# Patient Record
Sex: Female | Born: 1935 | Race: White | Hispanic: No | Smoking: Never smoker
Health system: Southern US, Community
[De-identification: ages and names within clinical notes are randomized; demographics above are authoritative.]

## PROBLEM LIST (undated history)

## (undated) DIAGNOSIS — K449 Diaphragmatic hernia without obstruction or gangrene: Secondary | ICD-10-CM

## (undated) DIAGNOSIS — F419 Anxiety disorder, unspecified: Secondary | ICD-10-CM

## (undated) DIAGNOSIS — M419 Scoliosis, unspecified: Secondary | ICD-10-CM

## (undated) DIAGNOSIS — Q782 Osteopetrosis: Secondary | ICD-10-CM

## (undated) DIAGNOSIS — IMO0002 Reserved for concepts with insufficient information to code with codable children: Secondary | ICD-10-CM

## (undated) DIAGNOSIS — K219 Gastro-esophageal reflux disease without esophagitis: Secondary | ICD-10-CM

## (undated) HISTORY — PX: BREAST LUMPECTOMY: SHX2

---

## 1998-02-06 ENCOUNTER — Emergency Department (HOSPITAL_COMMUNITY): Admission: EM | Admit: 1998-02-06 | Discharge: 1998-02-06 | Payer: Self-pay | Admitting: Emergency Medicine

## 1999-05-21 ENCOUNTER — Emergency Department (HOSPITAL_COMMUNITY): Admission: EM | Admit: 1999-05-21 | Discharge: 1999-05-21 | Payer: Self-pay | Admitting: *Deleted

## 1999-10-04 ENCOUNTER — Encounter: Payer: Self-pay | Admitting: Family Medicine

## 1999-10-04 ENCOUNTER — Encounter: Admission: RE | Admit: 1999-10-04 | Discharge: 1999-10-04 | Payer: Self-pay | Admitting: Family Medicine

## 2000-10-31 ENCOUNTER — Encounter: Admission: RE | Admit: 2000-10-31 | Discharge: 2000-10-31 | Payer: Self-pay | Admitting: Family Medicine

## 2000-10-31 ENCOUNTER — Encounter: Payer: Self-pay | Admitting: Family Medicine

## 2000-11-19 ENCOUNTER — Encounter: Admission: RE | Admit: 2000-11-19 | Discharge: 2000-11-19 | Payer: Self-pay | Admitting: Family Medicine

## 2000-11-19 ENCOUNTER — Encounter: Payer: Self-pay | Admitting: Family Medicine

## 2001-05-07 ENCOUNTER — Other Ambulatory Visit: Admission: RE | Admit: 2001-05-07 | Discharge: 2001-05-07 | Payer: Self-pay | Admitting: Family Medicine

## 2002-07-15 ENCOUNTER — Encounter (INDEPENDENT_AMBULATORY_CARE_PROVIDER_SITE_OTHER): Payer: Self-pay | Admitting: Specialist

## 2002-07-15 ENCOUNTER — Other Ambulatory Visit: Admission: RE | Admit: 2002-07-15 | Discharge: 2002-07-15 | Payer: Self-pay | Admitting: Family Medicine

## 2002-07-15 ENCOUNTER — Encounter: Payer: Self-pay | Admitting: Family Medicine

## 2002-07-15 ENCOUNTER — Encounter: Admission: RE | Admit: 2002-07-15 | Discharge: 2002-07-15 | Payer: Self-pay | Admitting: Family Medicine

## 2002-08-04 HISTORY — PX: BREAST EXCISIONAL BIOPSY: SUR124

## 2002-08-11 ENCOUNTER — Encounter (INDEPENDENT_AMBULATORY_CARE_PROVIDER_SITE_OTHER): Payer: Self-pay | Admitting: *Deleted

## 2002-08-11 ENCOUNTER — Ambulatory Visit (HOSPITAL_BASED_OUTPATIENT_CLINIC_OR_DEPARTMENT_OTHER): Admission: RE | Admit: 2002-08-11 | Discharge: 2002-08-11 | Payer: Self-pay | Admitting: Surgery

## 2003-11-26 ENCOUNTER — Encounter: Admission: RE | Admit: 2003-11-26 | Discharge: 2003-11-26 | Payer: Self-pay | Admitting: Family Medicine

## 2003-12-15 ENCOUNTER — Other Ambulatory Visit: Admission: RE | Admit: 2003-12-15 | Discharge: 2003-12-15 | Payer: Self-pay | Admitting: Family Medicine

## 2004-03-18 ENCOUNTER — Encounter: Admission: RE | Admit: 2004-03-18 | Discharge: 2004-03-18 | Payer: Self-pay | Admitting: Family Medicine

## 2004-06-17 ENCOUNTER — Encounter: Admission: RE | Admit: 2004-06-17 | Discharge: 2004-06-17 | Payer: Self-pay | Admitting: Family Medicine

## 2004-12-28 ENCOUNTER — Encounter: Admission: RE | Admit: 2004-12-28 | Discharge: 2004-12-28 | Payer: Self-pay | Admitting: Family Medicine

## 2005-01-27 ENCOUNTER — Encounter: Admission: RE | Admit: 2005-01-27 | Discharge: 2005-01-27 | Payer: Self-pay | Admitting: Family Medicine

## 2005-08-08 ENCOUNTER — Encounter: Admission: RE | Admit: 2005-08-08 | Discharge: 2005-08-08 | Payer: Self-pay | Admitting: Family Medicine

## 2005-08-14 ENCOUNTER — Encounter: Admission: RE | Admit: 2005-08-14 | Discharge: 2005-08-14 | Payer: Self-pay | Admitting: Family Medicine

## 2005-08-28 ENCOUNTER — Encounter: Admission: RE | Admit: 2005-08-28 | Discharge: 2005-08-28 | Payer: Self-pay | Admitting: Family Medicine

## 2006-08-31 ENCOUNTER — Other Ambulatory Visit: Admission: RE | Admit: 2006-08-31 | Discharge: 2006-08-31 | Payer: Self-pay | Admitting: Family Medicine

## 2007-07-09 ENCOUNTER — Encounter: Admission: RE | Admit: 2007-07-09 | Discharge: 2007-07-09 | Payer: Self-pay | Admitting: Family Medicine

## 2007-09-03 ENCOUNTER — Encounter: Admission: RE | Admit: 2007-09-03 | Discharge: 2007-09-03 | Payer: Self-pay | Admitting: Family Medicine

## 2007-09-18 ENCOUNTER — Ambulatory Visit: Payer: Self-pay | Admitting: Gastroenterology

## 2007-09-20 ENCOUNTER — Encounter: Admission: RE | Admit: 2007-09-20 | Discharge: 2007-09-20 | Payer: Self-pay | Admitting: Family Medicine

## 2007-10-01 ENCOUNTER — Telehealth (INDEPENDENT_AMBULATORY_CARE_PROVIDER_SITE_OTHER): Payer: Self-pay

## 2007-10-03 ENCOUNTER — Telehealth: Payer: Self-pay | Admitting: Gastroenterology

## 2007-10-04 ENCOUNTER — Ambulatory Visit: Payer: Self-pay | Admitting: Gastroenterology

## 2007-10-04 HISTORY — PX: COLONOSCOPY: SHX5424

## 2007-10-06 ENCOUNTER — Emergency Department (HOSPITAL_COMMUNITY): Admission: EM | Admit: 2007-10-06 | Discharge: 2007-10-06 | Payer: Self-pay | Admitting: Family Medicine

## 2008-06-05 DIAGNOSIS — K449 Diaphragmatic hernia without obstruction or gangrene: Secondary | ICD-10-CM

## 2008-06-05 HISTORY — DX: Diaphragmatic hernia without obstruction or gangrene: K44.9

## 2008-07-27 ENCOUNTER — Encounter: Admission: RE | Admit: 2008-07-27 | Discharge: 2008-07-27 | Payer: Self-pay | Admitting: Family Medicine

## 2008-09-25 ENCOUNTER — Other Ambulatory Visit: Admission: RE | Admit: 2008-09-25 | Discharge: 2008-09-25 | Payer: Self-pay | Admitting: Geriatric Medicine

## 2008-10-16 ENCOUNTER — Encounter: Admission: RE | Admit: 2008-10-16 | Discharge: 2008-10-16 | Payer: Self-pay | Admitting: Geriatric Medicine

## 2009-08-17 ENCOUNTER — Encounter: Admission: RE | Admit: 2009-08-17 | Discharge: 2009-08-17 | Payer: Self-pay | Admitting: Geriatric Medicine

## 2010-01-26 ENCOUNTER — Encounter: Admission: RE | Admit: 2010-01-26 | Discharge: 2010-01-26 | Payer: Self-pay | Admitting: Geriatric Medicine

## 2010-04-06 ENCOUNTER — Ambulatory Visit (HOSPITAL_COMMUNITY): Admission: RE | Admit: 2010-04-06 | Discharge: 2010-04-06 | Payer: Self-pay | Admitting: Urology

## 2010-06-26 ENCOUNTER — Encounter: Payer: Self-pay | Admitting: Geriatric Medicine

## 2010-10-13 ENCOUNTER — Other Ambulatory Visit: Payer: Self-pay | Admitting: Geriatric Medicine

## 2010-10-13 DIAGNOSIS — Z1231 Encounter for screening mammogram for malignant neoplasm of breast: Secondary | ICD-10-CM

## 2010-10-18 ENCOUNTER — Ambulatory Visit: Payer: Self-pay

## 2010-10-19 ENCOUNTER — Ambulatory Visit
Admission: RE | Admit: 2010-10-19 | Discharge: 2010-10-19 | Disposition: A | Discharge status: Home / Self Care | Payer: Medicare Other | Source: Ambulatory Visit | Attending: Geriatric Medicine | Admitting: Geriatric Medicine

## 2010-10-19 DIAGNOSIS — Z1231 Encounter for screening mammogram for malignant neoplasm of breast: Secondary | ICD-10-CM

## 2010-10-20 ENCOUNTER — Ambulatory Visit: Payer: Self-pay

## 2010-10-21 NOTE — Op Note (Signed)
NAME:  Jennifer Kirk, Jennifer Kirk                      ACCOUNT NO.:  [id number]   MEDICAL RECORD NO.:  [id number]                   PATIENT TYPE:  AMB   LOCATION:  DSC                                  FACILITY:  [facility]   PHYSICIAN:  Currie Paris, M.D.           DATE OF BIRTH:  07-04-1935   DATE OF PROCEDURE:  08/11/2002  DATE OF DISCHARGE:                                 OPERATIVE REPORT   CCS [id number].   PREOPERATIVE DIAGNOSIS:  Mass, left breast.   POSTOPERATIVE DIAGNOSIS:  Mass, left breast.   PROCEDURE:  Excision, left breast mass.   SURGEON:  Currie Paris, M.D.   ANESTHESIA:  MAC.   CLINICAL HISTORY:  This patient recently found a small left breast mass and  biopsy showed some atypia, but a diagnosis of cancer could not be made;  however, the mass was worrisome on ultrasound so we elected to do an  excisional biopsy.   DESCRIPTION OF PROCEDURE:  The patient was seen in the holding area and had  no further questions.  In the operating room the area in question was  identified by the patient by palpation and marked by me.  We also confirmed  its location with ultrasound in about the 5 o'clock position about 6 cm from  the nipple.   After satisfactory IV sedation was given, the breast was then prepped and  draped and anesthetized with 1% Xylocaine.  I made a radial incision  directly over the mass and as soon as I got into the subcutaneous tissues, I  used cautery to go around the area in question, first inferiorly since we  were almost at the inframammary fold, and then medially and a little bit  laterally, and then once I had that freed up, I had the lower edge freed up,  I was able to come under it, elevate it into the wound and come under it  with the cautery, staying right on the chest wall.  This gave me the ability  to palpate it between my fingers and pull and retract it out of the wound  and then using cautery, completely excised it.  There was at least  one  complex-looking cyst that was included, and I think some fibrocystic change  was noted.  I got the entire area of palpable abnormality out, which  measured about a centimeter in size with about what I thought was perhaps as  much as a centimeter margin in directions.   I used the marking clips to orient this for the pathologist.  This was sent  for permanents.   The wound was checked and when everything was dry, I closed this with 3-0  Vicryl to reapproximate some of the breast tissue and 4-0 Monocryl  subcuticular on the skin plus Steri-Strips.   The patient tolerated the procedure well.  There were no operative  complications.  All counts were correct.  Currie Paris, M.D.    CJS/MEDQ  D:  08/11/2002  T:  08/11/2002  Job:  [id number]   cc:   Quita Skye. Artis Flock, M.D.  [address]  [place]  [place] [address]  Fax: [phone]   Rosalie Gums, MD, [facility]

## 2011-07-04 DIAGNOSIS — R42 Dizziness and giddiness: Secondary | ICD-10-CM | POA: Diagnosis not present

## 2011-07-04 DIAGNOSIS — Z23 Encounter for immunization: Secondary | ICD-10-CM | POA: Diagnosis not present

## 2011-07-04 DIAGNOSIS — M810 Age-related osteoporosis without current pathological fracture: Secondary | ICD-10-CM | POA: Diagnosis not present

## 2011-07-26 DIAGNOSIS — Z79899 Other long term (current) drug therapy: Secondary | ICD-10-CM | POA: Diagnosis not present

## 2011-07-31 ENCOUNTER — Other Ambulatory Visit (HOSPITAL_COMMUNITY): Payer: Self-pay | Admitting: *Deleted

## 2011-08-03 ENCOUNTER — Encounter (HOSPITAL_COMMUNITY)
Admission: RE | Admit: 2011-08-03 | Discharge: 2011-08-03 | Disposition: A | Discharge status: Home / Self Care | Payer: Medicare Other | Source: Ambulatory Visit | Attending: Geriatric Medicine | Admitting: Geriatric Medicine

## 2011-08-03 DIAGNOSIS — M810 Age-related osteoporosis without current pathological fracture: Secondary | ICD-10-CM | POA: Insufficient documentation

## 2011-08-03 MED ORDER — ZOLEDRONIC ACID 5 MG/100ML IV SOLN
5.0000 mg | Freq: Once | INTRAVENOUS | Status: AC
  Administered 2011-08-03: 5 mg via INTRAVENOUS
  Filled 2011-08-03: qty 100

## 2011-11-06 ENCOUNTER — Other Ambulatory Visit: Payer: Self-pay | Admitting: Geriatric Medicine

## 2011-11-06 DIAGNOSIS — Z1231 Encounter for screening mammogram for malignant neoplasm of breast: Secondary | ICD-10-CM

## 2011-11-27 ENCOUNTER — Ambulatory Visit
Admission: RE | Admit: 2011-11-27 | Discharge: 2011-11-27 | Disposition: A | Discharge status: Home / Self Care | Payer: Medicare Other | Source: Ambulatory Visit | Attending: Geriatric Medicine | Admitting: Geriatric Medicine

## 2011-11-27 DIAGNOSIS — Z1231 Encounter for screening mammogram for malignant neoplasm of breast: Secondary | ICD-10-CM

## 2011-11-30 ENCOUNTER — Other Ambulatory Visit: Payer: Self-pay | Admitting: Geriatric Medicine

## 2011-11-30 DIAGNOSIS — R928 Other abnormal and inconclusive findings on diagnostic imaging of breast: Secondary | ICD-10-CM

## 2011-12-06 ENCOUNTER — Other Ambulatory Visit: Payer: Medicare Other

## 2011-12-12 ENCOUNTER — Ambulatory Visit
Admission: RE | Admit: 2011-12-12 | Discharge: 2011-12-12 | Disposition: A | Discharge status: Home / Self Care | Payer: Medicare Other | Source: Ambulatory Visit | Attending: Geriatric Medicine | Admitting: Geriatric Medicine

## 2011-12-12 DIAGNOSIS — R928 Other abnormal and inconclusive findings on diagnostic imaging of breast: Secondary | ICD-10-CM

## 2012-01-04 DIAGNOSIS — W57XXXA Bitten or stung by nonvenomous insect and other nonvenomous arthropods, initial encounter: Secondary | ICD-10-CM | POA: Diagnosis not present

## 2012-01-04 DIAGNOSIS — T148 Other injury of unspecified body region: Secondary | ICD-10-CM | POA: Diagnosis not present

## 2012-02-14 ENCOUNTER — Other Ambulatory Visit: Payer: Self-pay | Admitting: Geriatric Medicine

## 2012-02-14 DIAGNOSIS — Z23 Encounter for immunization: Secondary | ICD-10-CM | POA: Diagnosis not present

## 2012-02-14 DIAGNOSIS — R42 Dizziness and giddiness: Secondary | ICD-10-CM | POA: Diagnosis not present

## 2012-02-14 DIAGNOSIS — Z0000 Encounter for general adult medical examination without abnormal findings: Secondary | ICD-10-CM | POA: Diagnosis not present

## 2012-02-14 DIAGNOSIS — M549 Dorsalgia, unspecified: Secondary | ICD-10-CM | POA: Diagnosis not present

## 2012-02-14 DIAGNOSIS — K449 Diaphragmatic hernia without obstruction or gangrene: Secondary | ICD-10-CM | POA: Diagnosis not present

## 2012-02-14 DIAGNOSIS — Z1331 Encounter for screening for depression: Secondary | ICD-10-CM | POA: Diagnosis not present

## 2012-02-14 DIAGNOSIS — E7800 Pure hypercholesterolemia, unspecified: Secondary | ICD-10-CM | POA: Diagnosis not present

## 2012-02-14 DIAGNOSIS — R5381 Other malaise: Secondary | ICD-10-CM | POA: Diagnosis not present

## 2012-02-14 DIAGNOSIS — M545 Low back pain: Secondary | ICD-10-CM

## 2012-02-14 DIAGNOSIS — Z79899 Other long term (current) drug therapy: Secondary | ICD-10-CM | POA: Diagnosis not present

## 2012-02-14 DIAGNOSIS — R5383 Other fatigue: Secondary | ICD-10-CM | POA: Diagnosis not present

## 2012-02-16 DIAGNOSIS — H6120 Impacted cerumen, unspecified ear: Secondary | ICD-10-CM | POA: Diagnosis not present

## 2012-02-20 ENCOUNTER — Ambulatory Visit
Admission: RE | Admit: 2012-02-20 | Discharge: 2012-02-20 | Disposition: A | Discharge status: Home / Self Care | Payer: Medicare Other | Source: Ambulatory Visit | Attending: Geriatric Medicine | Admitting: Geriatric Medicine

## 2012-02-20 DIAGNOSIS — M48061 Spinal stenosis, lumbar region without neurogenic claudication: Secondary | ICD-10-CM | POA: Diagnosis not present

## 2012-02-20 DIAGNOSIS — M545 Low back pain, unspecified: Secondary | ICD-10-CM

## 2012-03-11 DIAGNOSIS — M810 Age-related osteoporosis without current pathological fracture: Secondary | ICD-10-CM | POA: Diagnosis not present

## 2012-03-26 DIAGNOSIS — M47817 Spondylosis without myelopathy or radiculopathy, lumbosacral region: Secondary | ICD-10-CM | POA: Diagnosis not present

## 2012-04-02 DIAGNOSIS — H6980 Other specified disorders of Eustachian tube, unspecified ear: Secondary | ICD-10-CM | POA: Diagnosis not present

## 2012-04-02 DIAGNOSIS — H6990 Unspecified Eustachian tube disorder, unspecified ear: Secondary | ICD-10-CM | POA: Diagnosis not present

## 2012-04-02 DIAGNOSIS — H9110 Presbycusis, unspecified ear: Secondary | ICD-10-CM | POA: Diagnosis not present

## 2012-04-04 DIAGNOSIS — H52209 Unspecified astigmatism, unspecified eye: Secondary | ICD-10-CM | POA: Diagnosis not present

## 2012-04-04 DIAGNOSIS — H2510 Age-related nuclear cataract, unspecified eye: Secondary | ICD-10-CM | POA: Diagnosis not present

## 2012-04-15 DIAGNOSIS — M810 Age-related osteoporosis without current pathological fracture: Secondary | ICD-10-CM | POA: Diagnosis not present

## 2012-04-15 DIAGNOSIS — Z23 Encounter for immunization: Secondary | ICD-10-CM | POA: Diagnosis not present

## 2012-04-18 ENCOUNTER — Encounter (HOSPITAL_COMMUNITY): Payer: Self-pay | Admitting: *Deleted

## 2012-04-18 ENCOUNTER — Emergency Department (HOSPITAL_COMMUNITY)
Admission: EM | Admit: 2012-04-18 | Discharge: 2012-04-19 | Disposition: A | Discharge status: Home / Self Care | Payer: Medicare Other | Attending: Emergency Medicine | Admitting: Emergency Medicine

## 2012-04-18 DIAGNOSIS — IMO0002 Reserved for concepts with insufficient information to code with codable children: Secondary | ICD-10-CM | POA: Diagnosis not present

## 2012-04-18 DIAGNOSIS — Z79899 Other long term (current) drug therapy: Secondary | ICD-10-CM | POA: Insufficient documentation

## 2012-04-18 DIAGNOSIS — Z8739 Personal history of other diseases of the musculoskeletal system and connective tissue: Secondary | ICD-10-CM | POA: Insufficient documentation

## 2012-04-18 DIAGNOSIS — T5991XA Toxic effect of unspecified gases, fumes and vapors, accidental (unintentional), initial encounter: Secondary | ICD-10-CM | POA: Insufficient documentation

## 2012-04-18 DIAGNOSIS — Y929 Unspecified place or not applicable: Secondary | ICD-10-CM | POA: Insufficient documentation

## 2012-04-18 DIAGNOSIS — Q782 Osteopetrosis: Secondary | ICD-10-CM | POA: Insufficient documentation

## 2012-04-18 DIAGNOSIS — Z8659 Personal history of other mental and behavioral disorders: Secondary | ICD-10-CM | POA: Insufficient documentation

## 2012-04-18 DIAGNOSIS — Z8719 Personal history of other diseases of the digestive system: Secondary | ICD-10-CM | POA: Insufficient documentation

## 2012-04-18 DIAGNOSIS — Y939 Activity, unspecified: Secondary | ICD-10-CM | POA: Insufficient documentation

## 2012-04-18 HISTORY — DX: Diaphragmatic hernia without obstruction or gangrene: K44.9

## 2012-04-18 HISTORY — DX: Reserved for concepts with insufficient information to code with codable children: IMO0002

## 2012-04-18 HISTORY — DX: Scoliosis, unspecified: M41.9

## 2012-04-18 HISTORY — DX: Osteopetrosis: Q78.2

## 2012-04-18 HISTORY — DX: Gastro-esophageal reflux disease without esophagitis: K21.9

## 2012-04-18 HISTORY — DX: Anxiety disorder, unspecified: F41.9

## 2012-04-18 NOTE — ED Provider Notes (Signed)
History     CSN: [id number]  Arrival date & time 04/18/12  2229   First MD Initiated Contact with Patient 04/18/12 2337      Chief Complaint  Patient presents with  . Toxic Inhalation     HPI Pt was seen at 2340.  Per pt, c/o sudden onset and resolution of one episode of chemical inhalation that occurred PTA.  Pt states she was spray painting at home with the windows closed and no mask on, when she felt upper chest "tightness," SOB, nausea, lightheadedness. States she immediately opened the windows and went outside and felt better.  All symptoms remain resolved.  Denies current CP/palpitations, no SOB/cough, no abd pain, no N/V/D, no back pain.     Past Medical History  Diagnosis Date  . Anxiety   . Hiatal hernia   . GERD (gastroesophageal reflux disease)   . Scoliosis   . Degenerative disc disease   . Osteopetrosis     Past Surgical History  Procedure Date  . Breast lumpectomy     History  Substance Use Topics  . Smoking status: Never Smoker   . Smokeless tobacco: Not on file  . Alcohol Use: Yes     Review of Systems ROS: Statement: All systems negative except as marked or noted in the HPI; Constitutional: Negative for fever and chills. ; ; Eyes: Negative for eye pain, redness and discharge. ; ; ENMT: Negative for ear pain, hoarseness, nasal congestion, sinus pressure and sore throat. ; ; Cardiovascular: Negative for chest pain, palpitations, diaphoresis, dyspnea and peripheral edema. ; ; Respiratory: Negative for cough, wheezing and stridor. ; ; Gastrointestinal: Negative for nausea, vomiting, diarrhea, abdominal pain, blood in stool, hematemesis, jaundice and rectal bleeding. . ; ; Genitourinary: Negative for dysuria, flank pain and hematuria. ; ; Musculoskeletal: Negative for back pain and neck pain. Negative for swelling and trauma.; ; Skin: Negative for pruritus, rash, abrasions, blisters, bruising and skin lesion.; ; Neuro: Negative for headache, lightheadedness and  neck stiffness. Negative for weakness, altered level of consciousness , altered mental status, extremity weakness, paresthesias, involuntary movement, seizure and syncope.       Allergies  Codeine and Penicillins  Home Medications   Current Outpatient Rx  Name  Route  Sig  Dispense  Refill  . RISAQUAD PO CAPS   Oral   Take 1 capsule by mouth daily.         Marland Kitchen BIOTIN PO   Oral   Take 1 capsule by mouth daily.         . CHOLECALCIFEROL 400 UNITS PO TABS   Oral   Take 400 Units by mouth daily.         . OMEGA-3 FATTY ACIDS 1000 MG PO CAPS   Oral   Take 1 g by mouth daily.         Marland Kitchen GINGER PO   Oral   Take 1 tablet by mouth daily.         . ADULT MULTIVITAMIN W/MINERALS CH   Oral   Take 1 tablet by mouth daily.         Marland Kitchen VITAMIN C 500 MG PO TABS   Oral   Take 500 mg by mouth daily.           BP 130/81  Pulse 97  Temp 97.6 F (36.4 C) (Oral)  Resp 16  Ht 4\' 8"  (1.422 m)  Wt 110 lb (49.896 kg)  BMI 24.66 kg/m2  SpO2 94%  Physical  Exam 2345: Physical examination:  Nursing notes reviewed; Vital signs and O2 SAT reviewed;  Constitutional: Well developed, Well nourished, Well hydrated, In no acute distress; Head:  Normocephalic, atraumatic; Eyes: EOMI, PERRL, No scleral icterus; ENMT: Mouth and pharynx normal, Mucous membranes moist. No intra-oral edema, no hoarse voice, no drooling, no stridor.; Neck: Supple, Full range of motion, No lymphadenopathy; Cardiovascular: Regular rate and rhythm, No gallop; Respiratory: Breath sounds clear & equal bilaterally, No rales, rhonchi, wheezes.  Speaking full sentences with ease, Normal respiratory effort/excursion; Chest: Nontender, Movement normal; Abdomen: Soft, Nontender, Nondistended, Normal bowel sounds;; Extremities: Pulses normal, No tenderness, No edema, No calf edema or asymmetry.; Neuro: AA&Ox3, Major CN grossly intact.  Speech clear. Climbs in and out of chair by herself without difficulty. Gait steady. No  gross focal motor or sensory deficits in extremities.; Skin: Color normal, Warm, Dry.    ED Course  Procedures   MDM  MDM Reviewed: previous chart, nursing note and vitals     2355:  Pt wants to go home now.  Pt does not want any testing (EKG, CXR) and "just wants to go home right now."  Has been ambulatory around the ED with steady gait, easy resps, Sats 94% R/A.  Offered CXR again and pt refuses.  Pt makes her own medical decisions.  Pt encouraged to f/u with PMD tomorrow or return to the ED if she changes her mind.  Verb understanding.        Laray Anger, DO 04/21/12 1624

## 2012-04-18 NOTE — ED Notes (Signed)
Pt alert and oriented from home with c/o spray paint inhalation. Patient sts she had chest tightness, respiratory difficulty, nausea and lightheadedness when she first inhaled the spray paint but now she is feeling much better. Patient with no distress at this time, no difficulty breathing.

## 2012-04-18 NOTE — ED Notes (Signed)
Pt was spray painting fireplace in a no-ventilated area and experienced a few minutes of chest tightness, shortness of breath, dizziness and nausea. Symptoms all cleared after going outside in the fresh air; pt denies chest pain, shortness of breath, dizziness or nausea currently.

## 2012-04-19 NOTE — ED Notes (Signed)
Pt not in room upon coming to DC pt; son reported to secretary that he was taking her home because she was tired and was ready to go; pt did not wait for dc instructions

## 2012-04-23 DIAGNOSIS — M545 Low back pain, unspecified: Secondary | ICD-10-CM | POA: Diagnosis not present

## 2012-05-06 DIAGNOSIS — M545 Low back pain, unspecified: Secondary | ICD-10-CM | POA: Diagnosis not present

## 2012-05-09 DIAGNOSIS — M545 Low back pain, unspecified: Secondary | ICD-10-CM | POA: Diagnosis not present

## 2012-05-15 DIAGNOSIS — M545 Low back pain, unspecified: Secondary | ICD-10-CM | POA: Diagnosis not present

## 2012-05-23 DIAGNOSIS — M545 Low back pain, unspecified: Secondary | ICD-10-CM | POA: Diagnosis not present

## 2012-06-06 DIAGNOSIS — H6990 Unspecified Eustachian tube disorder, unspecified ear: Secondary | ICD-10-CM | POA: Diagnosis not present

## 2012-06-06 DIAGNOSIS — H6980 Other specified disorders of Eustachian tube, unspecified ear: Secondary | ICD-10-CM | POA: Diagnosis not present

## 2012-06-06 DIAGNOSIS — M545 Low back pain, unspecified: Secondary | ICD-10-CM | POA: Diagnosis not present

## 2012-06-06 DIAGNOSIS — H9110 Presbycusis, unspecified ear: Secondary | ICD-10-CM | POA: Diagnosis not present

## 2012-06-10 DIAGNOSIS — M545 Low back pain, unspecified: Secondary | ICD-10-CM | POA: Diagnosis not present

## 2012-06-12 DIAGNOSIS — M545 Low back pain, unspecified: Secondary | ICD-10-CM | POA: Diagnosis not present

## 2012-06-13 DIAGNOSIS — M545 Low back pain, unspecified: Secondary | ICD-10-CM | POA: Diagnosis not present

## 2012-06-17 DIAGNOSIS — M545 Low back pain, unspecified: Secondary | ICD-10-CM | POA: Diagnosis not present

## 2012-06-20 DIAGNOSIS — M545 Low back pain, unspecified: Secondary | ICD-10-CM | POA: Diagnosis not present

## 2012-06-24 DIAGNOSIS — M545 Low back pain, unspecified: Secondary | ICD-10-CM | POA: Diagnosis not present

## 2012-06-25 DIAGNOSIS — H6990 Unspecified Eustachian tube disorder, unspecified ear: Secondary | ICD-10-CM | POA: Diagnosis not present

## 2012-06-25 DIAGNOSIS — H6980 Other specified disorders of Eustachian tube, unspecified ear: Secondary | ICD-10-CM | POA: Diagnosis not present

## 2012-07-01 DIAGNOSIS — M545 Low back pain, unspecified: Secondary | ICD-10-CM | POA: Diagnosis not present

## 2012-07-08 DIAGNOSIS — M545 Low back pain, unspecified: Secondary | ICD-10-CM | POA: Diagnosis not present

## 2012-07-12 DIAGNOSIS — M545 Low back pain, unspecified: Secondary | ICD-10-CM | POA: Diagnosis not present

## 2012-07-15 DIAGNOSIS — M545 Low back pain, unspecified: Secondary | ICD-10-CM | POA: Diagnosis not present

## 2012-07-22 DIAGNOSIS — M545 Low back pain, unspecified: Secondary | ICD-10-CM | POA: Diagnosis not present

## 2012-07-23 DIAGNOSIS — H9110 Presbycusis, unspecified ear: Secondary | ICD-10-CM | POA: Diagnosis not present

## 2012-07-24 DIAGNOSIS — M545 Low back pain, unspecified: Secondary | ICD-10-CM | POA: Diagnosis not present

## 2012-07-30 DIAGNOSIS — M545 Low back pain, unspecified: Secondary | ICD-10-CM | POA: Diagnosis not present

## 2012-08-08 DIAGNOSIS — M545 Low back pain, unspecified: Secondary | ICD-10-CM | POA: Diagnosis not present

## 2012-08-12 DIAGNOSIS — M545 Low back pain, unspecified: Secondary | ICD-10-CM | POA: Diagnosis not present

## 2012-08-14 DIAGNOSIS — M545 Low back pain, unspecified: Secondary | ICD-10-CM | POA: Diagnosis not present

## 2012-08-21 DIAGNOSIS — M545 Low back pain, unspecified: Secondary | ICD-10-CM | POA: Diagnosis not present

## 2012-08-23 DIAGNOSIS — M545 Low back pain, unspecified: Secondary | ICD-10-CM | POA: Diagnosis not present

## 2012-08-28 DIAGNOSIS — M545 Low back pain, unspecified: Secondary | ICD-10-CM | POA: Diagnosis not present

## 2012-09-03 DIAGNOSIS — M545 Low back pain, unspecified: Secondary | ICD-10-CM | POA: Diagnosis not present

## 2012-10-08 DIAGNOSIS — H2510 Age-related nuclear cataract, unspecified eye: Secondary | ICD-10-CM | POA: Diagnosis not present

## 2012-10-08 DIAGNOSIS — H179 Unspecified corneal scar and opacity: Secondary | ICD-10-CM | POA: Diagnosis not present

## 2012-11-25 ENCOUNTER — Other Ambulatory Visit: Payer: Self-pay

## 2012-11-25 DIAGNOSIS — Z1231 Encounter for screening mammogram for malignant neoplasm of breast: Secondary | ICD-10-CM

## 2012-12-11 ENCOUNTER — Ambulatory Visit: Payer: Medicare Other

## 2013-01-01 ENCOUNTER — Ambulatory Visit
Admission: RE | Admit: 2013-01-01 | Discharge: 2013-01-01 | Disposition: A | Discharge status: Home / Self Care | Payer: Medicare Other | Source: Ambulatory Visit

## 2013-01-01 DIAGNOSIS — Z1231 Encounter for screening mammogram for malignant neoplasm of breast: Secondary | ICD-10-CM

## 2013-02-18 DIAGNOSIS — Z0000 Encounter for general adult medical examination without abnormal findings: Secondary | ICD-10-CM | POA: Diagnosis not present

## 2013-02-18 DIAGNOSIS — Z79899 Other long term (current) drug therapy: Secondary | ICD-10-CM | POA: Diagnosis not present

## 2013-02-18 DIAGNOSIS — M810 Age-related osteoporosis without current pathological fracture: Secondary | ICD-10-CM | POA: Diagnosis not present

## 2013-02-18 DIAGNOSIS — Z1331 Encounter for screening for depression: Secondary | ICD-10-CM | POA: Diagnosis not present

## 2013-02-18 DIAGNOSIS — E7800 Pure hypercholesterolemia, unspecified: Secondary | ICD-10-CM | POA: Diagnosis not present

## 2013-03-05 DIAGNOSIS — H6980 Other specified disorders of Eustachian tube, unspecified ear: Secondary | ICD-10-CM | POA: Diagnosis not present

## 2013-03-05 DIAGNOSIS — H6120 Impacted cerumen, unspecified ear: Secondary | ICD-10-CM | POA: Diagnosis not present

## 2013-03-05 DIAGNOSIS — H6990 Unspecified Eustachian tube disorder, unspecified ear: Secondary | ICD-10-CM | POA: Diagnosis not present

## 2013-06-16 DIAGNOSIS — M545 Low back pain, unspecified: Secondary | ICD-10-CM | POA: Diagnosis not present

## 2013-06-24 DIAGNOSIS — M4120 Other idiopathic scoliosis, site unspecified: Secondary | ICD-10-CM | POA: Diagnosis not present

## 2013-06-30 DIAGNOSIS — M4120 Other idiopathic scoliosis, site unspecified: Secondary | ICD-10-CM | POA: Diagnosis not present

## 2013-07-02 DIAGNOSIS — M4120 Other idiopathic scoliosis, site unspecified: Secondary | ICD-10-CM | POA: Diagnosis not present

## 2013-07-08 DIAGNOSIS — M4120 Other idiopathic scoliosis, site unspecified: Secondary | ICD-10-CM | POA: Diagnosis not present

## 2013-07-10 DIAGNOSIS — M4120 Other idiopathic scoliosis, site unspecified: Secondary | ICD-10-CM | POA: Diagnosis not present

## 2013-07-14 DIAGNOSIS — M4120 Other idiopathic scoliosis, site unspecified: Secondary | ICD-10-CM | POA: Diagnosis not present

## 2013-07-17 DIAGNOSIS — M4120 Other idiopathic scoliosis, site unspecified: Secondary | ICD-10-CM | POA: Diagnosis not present

## 2013-07-21 DIAGNOSIS — M4120 Other idiopathic scoliosis, site unspecified: Secondary | ICD-10-CM | POA: Diagnosis not present

## 2013-07-28 DIAGNOSIS — M4120 Other idiopathic scoliosis, site unspecified: Secondary | ICD-10-CM | POA: Diagnosis not present

## 2013-07-30 DIAGNOSIS — M4120 Other idiopathic scoliosis, site unspecified: Secondary | ICD-10-CM | POA: Diagnosis not present

## 2013-08-05 DIAGNOSIS — M4120 Other idiopathic scoliosis, site unspecified: Secondary | ICD-10-CM | POA: Diagnosis not present

## 2013-08-08 DIAGNOSIS — M4120 Other idiopathic scoliosis, site unspecified: Secondary | ICD-10-CM | POA: Diagnosis not present

## 2013-08-12 DIAGNOSIS — M4120 Other idiopathic scoliosis, site unspecified: Secondary | ICD-10-CM | POA: Diagnosis not present

## 2013-08-19 DIAGNOSIS — M4120 Other idiopathic scoliosis, site unspecified: Secondary | ICD-10-CM | POA: Diagnosis not present

## 2013-08-22 DIAGNOSIS — M4120 Other idiopathic scoliosis, site unspecified: Secondary | ICD-10-CM | POA: Diagnosis not present

## 2013-08-27 DIAGNOSIS — M4120 Other idiopathic scoliosis, site unspecified: Secondary | ICD-10-CM | POA: Diagnosis not present

## 2013-09-03 DIAGNOSIS — M4120 Other idiopathic scoliosis, site unspecified: Secondary | ICD-10-CM | POA: Diagnosis not present

## 2013-09-23 DIAGNOSIS — H2510 Age-related nuclear cataract, unspecified eye: Secondary | ICD-10-CM | POA: Diagnosis not present

## 2013-09-23 DIAGNOSIS — M4120 Other idiopathic scoliosis, site unspecified: Secondary | ICD-10-CM | POA: Diagnosis not present

## 2013-10-08 DIAGNOSIS — M4120 Other idiopathic scoliosis, site unspecified: Secondary | ICD-10-CM | POA: Diagnosis not present

## 2013-10-22 DIAGNOSIS — M4120 Other idiopathic scoliosis, site unspecified: Secondary | ICD-10-CM | POA: Diagnosis not present

## 2013-11-13 DIAGNOSIS — H2510 Age-related nuclear cataract, unspecified eye: Secondary | ICD-10-CM | POA: Diagnosis not present

## 2013-11-13 DIAGNOSIS — H2589 Other age-related cataract: Secondary | ICD-10-CM | POA: Diagnosis not present

## 2013-12-24 ENCOUNTER — Other Ambulatory Visit: Payer: Self-pay

## 2013-12-24 DIAGNOSIS — Z9889 Other specified postprocedural states: Secondary | ICD-10-CM

## 2013-12-24 DIAGNOSIS — Z1231 Encounter for screening mammogram for malignant neoplasm of breast: Secondary | ICD-10-CM

## 2014-01-20 ENCOUNTER — Ambulatory Visit: Payer: Medicare Other

## 2014-01-29 ENCOUNTER — Ambulatory Visit: Payer: Medicare Other

## 2014-02-03 ENCOUNTER — Encounter (INDEPENDENT_AMBULATORY_CARE_PROVIDER_SITE_OTHER): Payer: Self-pay

## 2014-02-03 ENCOUNTER — Ambulatory Visit
Admission: RE | Admit: 2014-02-03 | Discharge: 2014-02-03 | Disposition: A | Discharge status: Home / Self Care | Payer: Medicare Other | Source: Ambulatory Visit

## 2014-02-03 DIAGNOSIS — Z1231 Encounter for screening mammogram for malignant neoplasm of breast: Secondary | ICD-10-CM

## 2014-02-03 DIAGNOSIS — Z9889 Other specified postprocedural states: Secondary | ICD-10-CM

## 2014-02-24 DIAGNOSIS — Z23 Encounter for immunization: Secondary | ICD-10-CM | POA: Diagnosis not present

## 2014-02-24 DIAGNOSIS — K219 Gastro-esophageal reflux disease without esophagitis: Secondary | ICD-10-CM | POA: Diagnosis not present

## 2014-02-24 DIAGNOSIS — E7800 Pure hypercholesterolemia, unspecified: Secondary | ICD-10-CM | POA: Diagnosis not present

## 2014-02-24 DIAGNOSIS — R5383 Other fatigue: Secondary | ICD-10-CM | POA: Diagnosis not present

## 2014-02-24 DIAGNOSIS — Z1331 Encounter for screening for depression: Secondary | ICD-10-CM | POA: Diagnosis not present

## 2014-02-24 DIAGNOSIS — E559 Vitamin D deficiency, unspecified: Secondary | ICD-10-CM | POA: Diagnosis not present

## 2014-02-24 DIAGNOSIS — M810 Age-related osteoporosis without current pathological fracture: Secondary | ICD-10-CM | POA: Diagnosis not present

## 2014-02-24 DIAGNOSIS — R5381 Other malaise: Secondary | ICD-10-CM | POA: Diagnosis not present

## 2014-02-24 DIAGNOSIS — Z0000 Encounter for general adult medical examination without abnormal findings: Secondary | ICD-10-CM | POA: Diagnosis not present

## 2014-03-16 DIAGNOSIS — M810 Age-related osteoporosis without current pathological fracture: Secondary | ICD-10-CM | POA: Diagnosis not present

## 2014-03-19 DIAGNOSIS — H25812 Combined forms of age-related cataract, left eye: Secondary | ICD-10-CM | POA: Diagnosis not present

## 2014-03-19 DIAGNOSIS — H2512 Age-related nuclear cataract, left eye: Secondary | ICD-10-CM | POA: Diagnosis not present

## 2014-04-09 DIAGNOSIS — M810 Age-related osteoporosis without current pathological fracture: Secondary | ICD-10-CM | POA: Diagnosis not present

## 2014-04-22 ENCOUNTER — Other Ambulatory Visit (HOSPITAL_COMMUNITY): Payer: Self-pay | Admitting: *Deleted

## 2014-04-23 ENCOUNTER — Ambulatory Visit (HOSPITAL_COMMUNITY)
Admission: RE | Admit: 2014-04-23 | Discharge: 2014-04-23 | Disposition: A | Discharge status: Home / Self Care | Payer: Medicare Other | Source: Ambulatory Visit | Attending: Geriatric Medicine | Admitting: Geriatric Medicine

## 2014-04-23 DIAGNOSIS — M810 Age-related osteoporosis without current pathological fracture: Secondary | ICD-10-CM | POA: Diagnosis not present

## 2014-04-23 MED ORDER — ZOLEDRONIC ACID 5 MG/100ML IV SOLN
INTRAVENOUS | Status: AC
Start: 2014-04-23 — End: 2014-04-23
  Administered 2014-04-23: 5 mg via INTRAVENOUS
  Filled 2014-04-23: qty 100

## 2014-04-23 MED ORDER — ZOLEDRONIC ACID 5 MG/100ML IV SOLN
5.0000 mg | Freq: Once | INTRAVENOUS | Status: AC
  Administered 2014-04-23: 5 mg via INTRAVENOUS

## 2014-05-14 DIAGNOSIS — J40 Bronchitis, not specified as acute or chronic: Secondary | ICD-10-CM | POA: Diagnosis not present

## 2014-05-20 DIAGNOSIS — J40 Bronchitis, not specified as acute or chronic: Secondary | ICD-10-CM | POA: Diagnosis not present

## 2014-06-09 DIAGNOSIS — M546 Pain in thoracic spine: Secondary | ICD-10-CM | POA: Diagnosis not present

## 2014-06-09 DIAGNOSIS — M4004 Postural kyphosis, thoracic region: Secondary | ICD-10-CM | POA: Diagnosis not present

## 2014-06-09 DIAGNOSIS — M4124 Other idiopathic scoliosis, thoracic region: Secondary | ICD-10-CM | POA: Diagnosis not present

## 2014-06-17 ENCOUNTER — Other Ambulatory Visit: Payer: Self-pay | Admitting: Geriatric Medicine

## 2014-06-17 ENCOUNTER — Ambulatory Visit
Admission: RE | Admit: 2014-06-17 | Discharge: 2014-06-17 | Disposition: A | Discharge status: Home / Self Care | Payer: Medicare Other | Source: Ambulatory Visit | Attending: Geriatric Medicine | Admitting: Geriatric Medicine

## 2014-06-17 DIAGNOSIS — R05 Cough: Secondary | ICD-10-CM | POA: Diagnosis not present

## 2014-06-17 DIAGNOSIS — R059 Cough, unspecified: Secondary | ICD-10-CM

## 2014-06-17 DIAGNOSIS — K449 Diaphragmatic hernia without obstruction or gangrene: Secondary | ICD-10-CM | POA: Diagnosis not present

## 2014-06-17 DIAGNOSIS — R0989 Other specified symptoms and signs involving the circulatory and respiratory systems: Secondary | ICD-10-CM | POA: Diagnosis not present

## 2014-06-17 DIAGNOSIS — R0602 Shortness of breath: Secondary | ICD-10-CM | POA: Diagnosis not present

## 2014-06-18 DIAGNOSIS — M4004 Postural kyphosis, thoracic region: Secondary | ICD-10-CM | POA: Diagnosis not present

## 2014-06-18 DIAGNOSIS — M546 Pain in thoracic spine: Secondary | ICD-10-CM | POA: Diagnosis not present

## 2014-06-18 DIAGNOSIS — M4124 Other idiopathic scoliosis, thoracic region: Secondary | ICD-10-CM | POA: Diagnosis not present

## 2014-06-25 DIAGNOSIS — M4004 Postural kyphosis, thoracic region: Secondary | ICD-10-CM | POA: Diagnosis not present

## 2014-06-25 DIAGNOSIS — M4124 Other idiopathic scoliosis, thoracic region: Secondary | ICD-10-CM | POA: Diagnosis not present

## 2014-06-25 DIAGNOSIS — M546 Pain in thoracic spine: Secondary | ICD-10-CM | POA: Diagnosis not present

## 2014-07-01 DIAGNOSIS — M546 Pain in thoracic spine: Secondary | ICD-10-CM | POA: Diagnosis not present

## 2014-07-01 DIAGNOSIS — M4124 Other idiopathic scoliosis, thoracic region: Secondary | ICD-10-CM | POA: Diagnosis not present

## 2014-07-01 DIAGNOSIS — M4004 Postural kyphosis, thoracic region: Secondary | ICD-10-CM | POA: Diagnosis not present

## 2014-07-07 DIAGNOSIS — H6982 Other specified disorders of Eustachian tube, left ear: Secondary | ICD-10-CM | POA: Diagnosis not present

## 2014-07-07 DIAGNOSIS — H9193 Unspecified hearing loss, bilateral: Secondary | ICD-10-CM | POA: Diagnosis not present

## 2014-07-07 DIAGNOSIS — Z974 Presence of external hearing-aid: Secondary | ICD-10-CM | POA: Diagnosis not present

## 2014-07-08 DIAGNOSIS — M546 Pain in thoracic spine: Secondary | ICD-10-CM | POA: Diagnosis not present

## 2014-07-08 DIAGNOSIS — M4004 Postural kyphosis, thoracic region: Secondary | ICD-10-CM | POA: Diagnosis not present

## 2014-07-08 DIAGNOSIS — M4124 Other idiopathic scoliosis, thoracic region: Secondary | ICD-10-CM | POA: Diagnosis not present

## 2014-07-15 DIAGNOSIS — M546 Pain in thoracic spine: Secondary | ICD-10-CM | POA: Diagnosis not present

## 2014-07-15 DIAGNOSIS — M4004 Postural kyphosis, thoracic region: Secondary | ICD-10-CM | POA: Diagnosis not present

## 2014-07-15 DIAGNOSIS — M4124 Other idiopathic scoliosis, thoracic region: Secondary | ICD-10-CM | POA: Diagnosis not present

## 2014-07-29 DIAGNOSIS — M4004 Postural kyphosis, thoracic region: Secondary | ICD-10-CM | POA: Diagnosis not present

## 2014-07-29 DIAGNOSIS — M546 Pain in thoracic spine: Secondary | ICD-10-CM | POA: Diagnosis not present

## 2014-07-29 DIAGNOSIS — M4124 Other idiopathic scoliosis, thoracic region: Secondary | ICD-10-CM | POA: Diagnosis not present

## 2014-08-12 DIAGNOSIS — M546 Pain in thoracic spine: Secondary | ICD-10-CM | POA: Diagnosis not present

## 2014-08-12 DIAGNOSIS — M4004 Postural kyphosis, thoracic region: Secondary | ICD-10-CM | POA: Diagnosis not present

## 2014-08-12 DIAGNOSIS — M4124 Other idiopathic scoliosis, thoracic region: Secondary | ICD-10-CM | POA: Diagnosis not present

## 2014-08-19 DIAGNOSIS — M4124 Other idiopathic scoliosis, thoracic region: Secondary | ICD-10-CM | POA: Diagnosis not present

## 2014-08-19 DIAGNOSIS — M4004 Postural kyphosis, thoracic region: Secondary | ICD-10-CM | POA: Diagnosis not present

## 2014-08-19 DIAGNOSIS — M546 Pain in thoracic spine: Secondary | ICD-10-CM | POA: Diagnosis not present

## 2014-08-25 DIAGNOSIS — H66015 Acute suppurative otitis media with spontaneous rupture of ear drum, recurrent, left ear: Secondary | ICD-10-CM | POA: Diagnosis not present

## 2014-08-25 DIAGNOSIS — H66019 Acute suppurative otitis media with spontaneous rupture of ear drum, unspecified ear: Secondary | ICD-10-CM | POA: Diagnosis not present

## 2014-08-27 DIAGNOSIS — M546 Pain in thoracic spine: Secondary | ICD-10-CM | POA: Diagnosis not present

## 2014-08-27 DIAGNOSIS — M4004 Postural kyphosis, thoracic region: Secondary | ICD-10-CM | POA: Diagnosis not present

## 2014-08-27 DIAGNOSIS — M4124 Other idiopathic scoliosis, thoracic region: Secondary | ICD-10-CM | POA: Diagnosis not present

## 2014-09-03 DIAGNOSIS — M4124 Other idiopathic scoliosis, thoracic region: Secondary | ICD-10-CM | POA: Diagnosis not present

## 2014-09-03 DIAGNOSIS — M4004 Postural kyphosis, thoracic region: Secondary | ICD-10-CM | POA: Diagnosis not present

## 2014-09-03 DIAGNOSIS — M546 Pain in thoracic spine: Secondary | ICD-10-CM | POA: Diagnosis not present

## 2014-09-08 DIAGNOSIS — H6982 Other specified disorders of Eustachian tube, left ear: Secondary | ICD-10-CM | POA: Diagnosis not present

## 2014-09-08 DIAGNOSIS — H6193 Disorder of external ear, unspecified, bilateral: Secondary | ICD-10-CM | POA: Diagnosis not present

## 2014-09-16 DIAGNOSIS — M4124 Other idiopathic scoliosis, thoracic region: Secondary | ICD-10-CM | POA: Diagnosis not present

## 2014-09-16 DIAGNOSIS — M4004 Postural kyphosis, thoracic region: Secondary | ICD-10-CM | POA: Diagnosis not present

## 2014-09-16 DIAGNOSIS — M546 Pain in thoracic spine: Secondary | ICD-10-CM | POA: Diagnosis not present

## 2014-09-23 DIAGNOSIS — M4124 Other idiopathic scoliosis, thoracic region: Secondary | ICD-10-CM | POA: Diagnosis not present

## 2014-09-23 DIAGNOSIS — M546 Pain in thoracic spine: Secondary | ICD-10-CM | POA: Diagnosis not present

## 2014-09-23 DIAGNOSIS — M4004 Postural kyphosis, thoracic region: Secondary | ICD-10-CM | POA: Diagnosis not present

## 2014-09-30 DIAGNOSIS — M4124 Other idiopathic scoliosis, thoracic region: Secondary | ICD-10-CM | POA: Diagnosis not present

## 2014-09-30 DIAGNOSIS — M546 Pain in thoracic spine: Secondary | ICD-10-CM | POA: Diagnosis not present

## 2014-09-30 DIAGNOSIS — M4004 Postural kyphosis, thoracic region: Secondary | ICD-10-CM | POA: Diagnosis not present

## 2014-10-06 ENCOUNTER — Encounter: Payer: Self-pay | Admitting: Gastroenterology

## 2014-10-07 DIAGNOSIS — M546 Pain in thoracic spine: Secondary | ICD-10-CM | POA: Diagnosis not present

## 2014-10-07 DIAGNOSIS — M4124 Other idiopathic scoliosis, thoracic region: Secondary | ICD-10-CM | POA: Diagnosis not present

## 2014-10-07 DIAGNOSIS — M4004 Postural kyphosis, thoracic region: Secondary | ICD-10-CM | POA: Diagnosis not present

## 2014-10-12 DIAGNOSIS — Z8669 Personal history of other diseases of the nervous system and sense organs: Secondary | ICD-10-CM | POA: Diagnosis not present

## 2014-10-14 DIAGNOSIS — M4124 Other idiopathic scoliosis, thoracic region: Secondary | ICD-10-CM | POA: Diagnosis not present

## 2014-10-14 DIAGNOSIS — M546 Pain in thoracic spine: Secondary | ICD-10-CM | POA: Diagnosis not present

## 2014-10-14 DIAGNOSIS — M4004 Postural kyphosis, thoracic region: Secondary | ICD-10-CM | POA: Diagnosis not present

## 2014-10-21 DIAGNOSIS — M546 Pain in thoracic spine: Secondary | ICD-10-CM | POA: Diagnosis not present

## 2014-10-21 DIAGNOSIS — M4124 Other idiopathic scoliosis, thoracic region: Secondary | ICD-10-CM | POA: Diagnosis not present

## 2014-10-21 DIAGNOSIS — M4004 Postural kyphosis, thoracic region: Secondary | ICD-10-CM | POA: Diagnosis not present

## 2014-10-29 DIAGNOSIS — M4124 Other idiopathic scoliosis, thoracic region: Secondary | ICD-10-CM | POA: Diagnosis not present

## 2014-10-29 DIAGNOSIS — M4004 Postural kyphosis, thoracic region: Secondary | ICD-10-CM | POA: Diagnosis not present

## 2014-10-29 DIAGNOSIS — M546 Pain in thoracic spine: Secondary | ICD-10-CM | POA: Diagnosis not present

## 2014-11-05 DIAGNOSIS — M546 Pain in thoracic spine: Secondary | ICD-10-CM | POA: Diagnosis not present

## 2014-11-05 DIAGNOSIS — M4124 Other idiopathic scoliosis, thoracic region: Secondary | ICD-10-CM | POA: Diagnosis not present

## 2014-11-05 DIAGNOSIS — M4004 Postural kyphosis, thoracic region: Secondary | ICD-10-CM | POA: Diagnosis not present

## 2014-11-12 DIAGNOSIS — M546 Pain in thoracic spine: Secondary | ICD-10-CM | POA: Diagnosis not present

## 2014-11-12 DIAGNOSIS — M4124 Other idiopathic scoliosis, thoracic region: Secondary | ICD-10-CM | POA: Diagnosis not present

## 2014-11-12 DIAGNOSIS — M4004 Postural kyphosis, thoracic region: Secondary | ICD-10-CM | POA: Diagnosis not present

## 2014-11-19 DIAGNOSIS — M546 Pain in thoracic spine: Secondary | ICD-10-CM | POA: Diagnosis not present

## 2014-11-19 DIAGNOSIS — M4004 Postural kyphosis, thoracic region: Secondary | ICD-10-CM | POA: Diagnosis not present

## 2014-11-19 DIAGNOSIS — M4124 Other idiopathic scoliosis, thoracic region: Secondary | ICD-10-CM | POA: Diagnosis not present

## 2014-11-25 DIAGNOSIS — M4124 Other idiopathic scoliosis, thoracic region: Secondary | ICD-10-CM | POA: Diagnosis not present

## 2014-11-25 DIAGNOSIS — M4004 Postural kyphosis, thoracic region: Secondary | ICD-10-CM | POA: Diagnosis not present

## 2014-11-25 DIAGNOSIS — M546 Pain in thoracic spine: Secondary | ICD-10-CM | POA: Diagnosis not present

## 2014-12-23 DIAGNOSIS — H9113 Presbycusis, bilateral: Secondary | ICD-10-CM | POA: Diagnosis not present

## 2014-12-28 DIAGNOSIS — H26493 Other secondary cataract, bilateral: Secondary | ICD-10-CM | POA: Diagnosis not present

## 2014-12-28 DIAGNOSIS — Z961 Presence of intraocular lens: Secondary | ICD-10-CM | POA: Diagnosis not present

## 2015-03-01 DIAGNOSIS — Z23 Encounter for immunization: Secondary | ICD-10-CM | POA: Diagnosis not present

## 2015-03-01 DIAGNOSIS — E780 Pure hypercholesterolemia: Secondary | ICD-10-CM | POA: Diagnosis not present

## 2015-03-01 DIAGNOSIS — M419 Scoliosis, unspecified: Secondary | ICD-10-CM | POA: Diagnosis not present

## 2015-03-01 DIAGNOSIS — G4700 Insomnia, unspecified: Secondary | ICD-10-CM | POA: Diagnosis not present

## 2015-03-01 DIAGNOSIS — M810 Age-related osteoporosis without current pathological fracture: Secondary | ICD-10-CM | POA: Diagnosis not present

## 2015-03-01 DIAGNOSIS — Z0000 Encounter for general adult medical examination without abnormal findings: Secondary | ICD-10-CM | POA: Diagnosis not present

## 2015-03-01 DIAGNOSIS — Z79899 Other long term (current) drug therapy: Secondary | ICD-10-CM | POA: Diagnosis not present

## 2015-03-01 DIAGNOSIS — Z1389 Encounter for screening for other disorder: Secondary | ICD-10-CM | POA: Diagnosis not present

## 2015-03-03 ENCOUNTER — Other Ambulatory Visit: Payer: Self-pay

## 2015-03-03 DIAGNOSIS — Z1231 Encounter for screening mammogram for malignant neoplasm of breast: Secondary | ICD-10-CM

## 2015-03-30 ENCOUNTER — Ambulatory Visit: Payer: Federal, State, Local not specified - PPO

## 2015-04-07 ENCOUNTER — Ambulatory Visit
Admission: RE | Admit: 2015-04-07 | Discharge: 2015-04-07 | Disposition: A | Discharge status: Home / Self Care | Payer: Medicare Other | Source: Ambulatory Visit

## 2015-04-07 DIAGNOSIS — Z1231 Encounter for screening mammogram for malignant neoplasm of breast: Secondary | ICD-10-CM | POA: Diagnosis not present

## 2015-04-28 ENCOUNTER — Encounter (HOSPITAL_COMMUNITY): Payer: Federal, State, Local not specified - PPO

## 2015-05-06 DIAGNOSIS — H04123 Dry eye syndrome of bilateral lacrimal glands: Secondary | ICD-10-CM | POA: Diagnosis not present

## 2015-06-09 DIAGNOSIS — M546 Pain in thoracic spine: Secondary | ICD-10-CM | POA: Diagnosis not present

## 2015-06-09 DIAGNOSIS — M4004 Postural kyphosis, thoracic region: Secondary | ICD-10-CM | POA: Diagnosis not present

## 2015-06-09 DIAGNOSIS — M4124 Other idiopathic scoliosis, thoracic region: Secondary | ICD-10-CM | POA: Diagnosis not present

## 2015-06-16 DIAGNOSIS — M4004 Postural kyphosis, thoracic region: Secondary | ICD-10-CM | POA: Diagnosis not present

## 2015-06-16 DIAGNOSIS — M546 Pain in thoracic spine: Secondary | ICD-10-CM | POA: Diagnosis not present

## 2015-06-16 DIAGNOSIS — M4124 Other idiopathic scoliosis, thoracic region: Secondary | ICD-10-CM | POA: Diagnosis not present

## 2015-06-24 DIAGNOSIS — M4004 Postural kyphosis, thoracic region: Secondary | ICD-10-CM | POA: Diagnosis not present

## 2015-06-24 DIAGNOSIS — M4124 Other idiopathic scoliosis, thoracic region: Secondary | ICD-10-CM | POA: Diagnosis not present

## 2015-06-24 DIAGNOSIS — M546 Pain in thoracic spine: Secondary | ICD-10-CM | POA: Diagnosis not present

## 2015-06-30 DIAGNOSIS — M546 Pain in thoracic spine: Secondary | ICD-10-CM | POA: Diagnosis not present

## 2015-06-30 DIAGNOSIS — M4124 Other idiopathic scoliosis, thoracic region: Secondary | ICD-10-CM | POA: Diagnosis not present

## 2015-06-30 DIAGNOSIS — M810 Age-related osteoporosis without current pathological fracture: Secondary | ICD-10-CM | POA: Diagnosis not present

## 2015-06-30 DIAGNOSIS — M4004 Postural kyphosis, thoracic region: Secondary | ICD-10-CM | POA: Diagnosis not present

## 2015-07-02 DIAGNOSIS — R21 Rash and other nonspecific skin eruption: Secondary | ICD-10-CM | POA: Diagnosis not present

## 2015-07-07 DIAGNOSIS — M546 Pain in thoracic spine: Secondary | ICD-10-CM | POA: Diagnosis not present

## 2015-07-07 DIAGNOSIS — M4004 Postural kyphosis, thoracic region: Secondary | ICD-10-CM | POA: Diagnosis not present

## 2015-07-07 DIAGNOSIS — M4124 Other idiopathic scoliosis, thoracic region: Secondary | ICD-10-CM | POA: Diagnosis not present

## 2015-07-14 ENCOUNTER — Other Ambulatory Visit (HOSPITAL_COMMUNITY): Payer: Self-pay | Admitting: *Deleted

## 2015-07-14 DIAGNOSIS — M4004 Postural kyphosis, thoracic region: Secondary | ICD-10-CM | POA: Diagnosis not present

## 2015-07-14 DIAGNOSIS — M546 Pain in thoracic spine: Secondary | ICD-10-CM | POA: Diagnosis not present

## 2015-07-14 DIAGNOSIS — M4124 Other idiopathic scoliosis, thoracic region: Secondary | ICD-10-CM | POA: Diagnosis not present

## 2015-07-15 ENCOUNTER — Ambulatory Visit (HOSPITAL_COMMUNITY)
Admission: RE | Admit: 2015-07-15 | Discharge: 2015-07-15 | Disposition: A | Discharge status: Home / Self Care | Payer: Medicare Other | Source: Ambulatory Visit | Attending: Urology | Admitting: Urology

## 2015-07-15 DIAGNOSIS — K30 Functional dyspepsia: Secondary | ICD-10-CM | POA: Insufficient documentation

## 2015-07-15 DIAGNOSIS — M810 Age-related osteoporosis without current pathological fracture: Secondary | ICD-10-CM | POA: Diagnosis not present

## 2015-07-15 MED ORDER — ZOLEDRONIC ACID 5 MG/100ML IV SOLN
5.0000 mg | Freq: Once | INTRAVENOUS | Status: AC
  Administered 2015-07-15: 5 mg via INTRAVENOUS

## 2015-07-15 MED ORDER — ZOLEDRONIC ACID 5 MG/100ML IV SOLN
INTRAVENOUS | Status: AC
Start: 2015-07-15 — End: 2015-07-15
  Filled 2015-07-15: qty 100

## 2015-07-21 DIAGNOSIS — M4004 Postural kyphosis, thoracic region: Secondary | ICD-10-CM | POA: Diagnosis not present

## 2015-07-21 DIAGNOSIS — M546 Pain in thoracic spine: Secondary | ICD-10-CM | POA: Diagnosis not present

## 2015-07-21 DIAGNOSIS — M4124 Other idiopathic scoliosis, thoracic region: Secondary | ICD-10-CM | POA: Diagnosis not present

## 2015-08-04 DIAGNOSIS — M4004 Postural kyphosis, thoracic region: Secondary | ICD-10-CM | POA: Diagnosis not present

## 2015-08-04 DIAGNOSIS — M4124 Other idiopathic scoliosis, thoracic region: Secondary | ICD-10-CM | POA: Diagnosis not present

## 2015-08-04 DIAGNOSIS — M546 Pain in thoracic spine: Secondary | ICD-10-CM | POA: Diagnosis not present

## 2015-08-18 DIAGNOSIS — M4004 Postural kyphosis, thoracic region: Secondary | ICD-10-CM | POA: Diagnosis not present

## 2015-08-18 DIAGNOSIS — M4124 Other idiopathic scoliosis, thoracic region: Secondary | ICD-10-CM | POA: Diagnosis not present

## 2015-08-18 DIAGNOSIS — M546 Pain in thoracic spine: Secondary | ICD-10-CM | POA: Diagnosis not present

## 2015-10-15 DIAGNOSIS — H02403 Unspecified ptosis of bilateral eyelids: Secondary | ICD-10-CM | POA: Diagnosis not present

## 2015-10-15 DIAGNOSIS — H1859 Other hereditary corneal dystrophies: Secondary | ICD-10-CM | POA: Diagnosis not present

## 2015-10-15 DIAGNOSIS — H52203 Unspecified astigmatism, bilateral: Secondary | ICD-10-CM | POA: Diagnosis not present

## 2015-10-15 DIAGNOSIS — H26493 Other secondary cataract, bilateral: Secondary | ICD-10-CM | POA: Diagnosis not present

## 2015-12-20 DIAGNOSIS — H9202 Otalgia, left ear: Secondary | ICD-10-CM | POA: Diagnosis not present

## 2015-12-20 DIAGNOSIS — M26622 Arthralgia of left temporomandibular joint: Secondary | ICD-10-CM | POA: Diagnosis not present

## 2015-12-20 DIAGNOSIS — H6522 Chronic serous otitis media, left ear: Secondary | ICD-10-CM | POA: Diagnosis not present

## 2016-01-06 DIAGNOSIS — H903 Sensorineural hearing loss, bilateral: Secondary | ICD-10-CM | POA: Diagnosis not present

## 2016-03-08 DIAGNOSIS — Z1389 Encounter for screening for other disorder: Secondary | ICD-10-CM | POA: Diagnosis not present

## 2016-03-08 DIAGNOSIS — Z79899 Other long term (current) drug therapy: Secondary | ICD-10-CM | POA: Diagnosis not present

## 2016-03-08 DIAGNOSIS — M810 Age-related osteoporosis without current pathological fracture: Secondary | ICD-10-CM | POA: Diagnosis not present

## 2016-03-08 DIAGNOSIS — R300 Dysuria: Secondary | ICD-10-CM | POA: Diagnosis not present

## 2016-03-08 DIAGNOSIS — E7800 Pure hypercholesterolemia, unspecified: Secondary | ICD-10-CM | POA: Diagnosis not present

## 2016-03-08 DIAGNOSIS — Z23 Encounter for immunization: Secondary | ICD-10-CM | POA: Diagnosis not present

## 2016-03-08 DIAGNOSIS — Z0000 Encounter for general adult medical examination without abnormal findings: Secondary | ICD-10-CM | POA: Diagnosis not present

## 2016-04-05 DIAGNOSIS — M810 Age-related osteoporosis without current pathological fracture: Secondary | ICD-10-CM | POA: Diagnosis not present

## 2016-06-14 DIAGNOSIS — M4124 Other idiopathic scoliosis, thoracic region: Secondary | ICD-10-CM | POA: Diagnosis not present

## 2016-06-14 DIAGNOSIS — M4004 Postural kyphosis, thoracic region: Secondary | ICD-10-CM | POA: Diagnosis not present

## 2016-06-14 DIAGNOSIS — M546 Pain in thoracic spine: Secondary | ICD-10-CM | POA: Diagnosis not present

## 2016-06-19 DIAGNOSIS — M4124 Other idiopathic scoliosis, thoracic region: Secondary | ICD-10-CM | POA: Diagnosis not present

## 2016-06-19 DIAGNOSIS — M4004 Postural kyphosis, thoracic region: Secondary | ICD-10-CM | POA: Diagnosis not present

## 2016-06-19 DIAGNOSIS — M546 Pain in thoracic spine: Secondary | ICD-10-CM | POA: Diagnosis not present

## 2016-07-06 DIAGNOSIS — M4124 Other idiopathic scoliosis, thoracic region: Secondary | ICD-10-CM | POA: Diagnosis not present

## 2016-07-06 DIAGNOSIS — M546 Pain in thoracic spine: Secondary | ICD-10-CM | POA: Diagnosis not present

## 2016-07-06 DIAGNOSIS — M4004 Postural kyphosis, thoracic region: Secondary | ICD-10-CM | POA: Diagnosis not present

## 2016-07-12 ENCOUNTER — Other Ambulatory Visit: Payer: Self-pay | Admitting: Geriatric Medicine

## 2016-07-12 DIAGNOSIS — Z1231 Encounter for screening mammogram for malignant neoplasm of breast: Secondary | ICD-10-CM

## 2016-07-19 DIAGNOSIS — M4004 Postural kyphosis, thoracic region: Secondary | ICD-10-CM | POA: Diagnosis not present

## 2016-07-19 DIAGNOSIS — M546 Pain in thoracic spine: Secondary | ICD-10-CM | POA: Diagnosis not present

## 2016-07-19 DIAGNOSIS — M4124 Other idiopathic scoliosis, thoracic region: Secondary | ICD-10-CM | POA: Diagnosis not present

## 2016-08-02 DIAGNOSIS — M546 Pain in thoracic spine: Secondary | ICD-10-CM | POA: Diagnosis not present

## 2016-08-02 DIAGNOSIS — M4124 Other idiopathic scoliosis, thoracic region: Secondary | ICD-10-CM | POA: Diagnosis not present

## 2016-08-02 DIAGNOSIS — M4004 Postural kyphosis, thoracic region: Secondary | ICD-10-CM | POA: Diagnosis not present

## 2016-08-16 ENCOUNTER — Ambulatory Visit
Admission: RE | Admit: 2016-08-16 | Discharge: 2016-08-16 | Disposition: A | Discharge status: Home / Self Care | Payer: Medicare Other | Source: Ambulatory Visit | Attending: Geriatric Medicine | Admitting: Geriatric Medicine

## 2016-08-16 DIAGNOSIS — Z1231 Encounter for screening mammogram for malignant neoplasm of breast: Secondary | ICD-10-CM | POA: Diagnosis not present

## 2016-08-17 DIAGNOSIS — M4124 Other idiopathic scoliosis, thoracic region: Secondary | ICD-10-CM | POA: Diagnosis not present

## 2016-08-17 DIAGNOSIS — M4004 Postural kyphosis, thoracic region: Secondary | ICD-10-CM | POA: Diagnosis not present

## 2016-08-17 DIAGNOSIS — M546 Pain in thoracic spine: Secondary | ICD-10-CM | POA: Diagnosis not present

## 2016-09-03 DIAGNOSIS — M4004 Postural kyphosis, thoracic region: Secondary | ICD-10-CM | POA: Diagnosis not present

## 2016-09-03 DIAGNOSIS — M546 Pain in thoracic spine: Secondary | ICD-10-CM | POA: Diagnosis not present

## 2016-09-03 DIAGNOSIS — M4124 Other idiopathic scoliosis, thoracic region: Secondary | ICD-10-CM | POA: Diagnosis not present

## 2016-09-06 DIAGNOSIS — M4004 Postural kyphosis, thoracic region: Secondary | ICD-10-CM | POA: Diagnosis not present

## 2016-09-06 DIAGNOSIS — M4124 Other idiopathic scoliosis, thoracic region: Secondary | ICD-10-CM | POA: Diagnosis not present

## 2016-09-06 DIAGNOSIS — M546 Pain in thoracic spine: Secondary | ICD-10-CM | POA: Diagnosis not present

## 2016-09-20 DIAGNOSIS — M546 Pain in thoracic spine: Secondary | ICD-10-CM | POA: Diagnosis not present

## 2016-09-20 DIAGNOSIS — M4004 Postural kyphosis, thoracic region: Secondary | ICD-10-CM | POA: Diagnosis not present

## 2016-09-20 DIAGNOSIS — M4124 Other idiopathic scoliosis, thoracic region: Secondary | ICD-10-CM | POA: Diagnosis not present

## 2016-10-04 DIAGNOSIS — M4124 Other idiopathic scoliosis, thoracic region: Secondary | ICD-10-CM | POA: Diagnosis not present

## 2016-10-04 DIAGNOSIS — M546 Pain in thoracic spine: Secondary | ICD-10-CM | POA: Diagnosis not present

## 2016-10-04 DIAGNOSIS — M4004 Postural kyphosis, thoracic region: Secondary | ICD-10-CM | POA: Diagnosis not present

## 2016-10-05 DIAGNOSIS — M810 Age-related osteoporosis without current pathological fracture: Secondary | ICD-10-CM | POA: Diagnosis not present

## 2016-10-18 ENCOUNTER — Encounter (HOSPITAL_COMMUNITY): Payer: Medicare Other

## 2016-10-18 DIAGNOSIS — M4124 Other idiopathic scoliosis, thoracic region: Secondary | ICD-10-CM | POA: Diagnosis not present

## 2016-10-18 DIAGNOSIS — M546 Pain in thoracic spine: Secondary | ICD-10-CM | POA: Diagnosis not present

## 2016-10-18 DIAGNOSIS — M4004 Postural kyphosis, thoracic region: Secondary | ICD-10-CM | POA: Diagnosis not present

## 2016-10-20 ENCOUNTER — Other Ambulatory Visit (HOSPITAL_COMMUNITY): Payer: Self-pay | Admitting: *Deleted

## 2016-10-23 ENCOUNTER — Ambulatory Visit (HOSPITAL_COMMUNITY)
Admission: RE | Admit: 2016-10-23 | Discharge: 2016-10-23 | Disposition: A | Discharge status: Home / Self Care | Payer: Medicare Other | Source: Ambulatory Visit | Attending: Geriatric Medicine | Admitting: Geriatric Medicine

## 2016-10-23 DIAGNOSIS — M810 Age-related osteoporosis without current pathological fracture: Secondary | ICD-10-CM | POA: Insufficient documentation

## 2016-10-23 MED ORDER — ZOLEDRONIC ACID 5 MG/100ML IV SOLN
5.0000 mg | Freq: Once | INTRAVENOUS | Status: AC
  Administered 2016-10-23: 5 mg via INTRAVENOUS

## 2016-10-23 MED ORDER — ZOLEDRONIC ACID 5 MG/100ML IV SOLN
INTRAVENOUS | Status: AC
  Filled 2016-10-23: qty 100

## 2016-11-02 DIAGNOSIS — M546 Pain in thoracic spine: Secondary | ICD-10-CM | POA: Diagnosis not present

## 2016-11-02 DIAGNOSIS — M4004 Postural kyphosis, thoracic region: Secondary | ICD-10-CM | POA: Diagnosis not present

## 2016-11-02 DIAGNOSIS — M4124 Other idiopathic scoliosis, thoracic region: Secondary | ICD-10-CM | POA: Diagnosis not present

## 2016-11-15 DIAGNOSIS — M4004 Postural kyphosis, thoracic region: Secondary | ICD-10-CM | POA: Diagnosis not present

## 2016-11-15 DIAGNOSIS — M4124 Other idiopathic scoliosis, thoracic region: Secondary | ICD-10-CM | POA: Diagnosis not present

## 2016-11-15 DIAGNOSIS — M546 Pain in thoracic spine: Secondary | ICD-10-CM | POA: Diagnosis not present

## 2016-12-13 DIAGNOSIS — M4004 Postural kyphosis, thoracic region: Secondary | ICD-10-CM | POA: Diagnosis not present

## 2016-12-13 DIAGNOSIS — M546 Pain in thoracic spine: Secondary | ICD-10-CM | POA: Diagnosis not present

## 2016-12-13 DIAGNOSIS — M4124 Other idiopathic scoliosis, thoracic region: Secondary | ICD-10-CM | POA: Diagnosis not present

## 2016-12-27 DIAGNOSIS — M4004 Postural kyphosis, thoracic region: Secondary | ICD-10-CM | POA: Diagnosis not present

## 2016-12-27 DIAGNOSIS — M546 Pain in thoracic spine: Secondary | ICD-10-CM | POA: Diagnosis not present

## 2016-12-27 DIAGNOSIS — M4124 Other idiopathic scoliosis, thoracic region: Secondary | ICD-10-CM | POA: Diagnosis not present

## 2017-01-17 DIAGNOSIS — M4124 Other idiopathic scoliosis, thoracic region: Secondary | ICD-10-CM | POA: Diagnosis not present

## 2017-01-17 DIAGNOSIS — M546 Pain in thoracic spine: Secondary | ICD-10-CM | POA: Diagnosis not present

## 2017-01-17 DIAGNOSIS — M4004 Postural kyphosis, thoracic region: Secondary | ICD-10-CM | POA: Diagnosis not present

## 2017-02-07 DIAGNOSIS — M4124 Other idiopathic scoliosis, thoracic region: Secondary | ICD-10-CM | POA: Diagnosis not present

## 2017-02-07 DIAGNOSIS — M546 Pain in thoracic spine: Secondary | ICD-10-CM | POA: Diagnosis not present

## 2017-02-07 DIAGNOSIS — M4004 Postural kyphosis, thoracic region: Secondary | ICD-10-CM | POA: Diagnosis not present

## 2017-02-28 DIAGNOSIS — M546 Pain in thoracic spine: Secondary | ICD-10-CM | POA: Diagnosis not present

## 2017-02-28 DIAGNOSIS — M4124 Other idiopathic scoliosis, thoracic region: Secondary | ICD-10-CM | POA: Diagnosis not present

## 2017-02-28 DIAGNOSIS — M4004 Postural kyphosis, thoracic region: Secondary | ICD-10-CM | POA: Diagnosis not present

## 2017-03-20 ENCOUNTER — Other Ambulatory Visit: Payer: Self-pay | Admitting: Geriatric Medicine

## 2017-03-20 DIAGNOSIS — R1314 Dysphagia, pharyngoesophageal phase: Secondary | ICD-10-CM | POA: Diagnosis not present

## 2017-03-20 DIAGNOSIS — Z79899 Other long term (current) drug therapy: Secondary | ICD-10-CM | POA: Diagnosis not present

## 2017-03-20 DIAGNOSIS — M546 Pain in thoracic spine: Secondary | ICD-10-CM | POA: Diagnosis not present

## 2017-03-20 DIAGNOSIS — Z1389 Encounter for screening for other disorder: Secondary | ICD-10-CM | POA: Diagnosis not present

## 2017-03-20 DIAGNOSIS — M810 Age-related osteoporosis without current pathological fracture: Secondary | ICD-10-CM | POA: Diagnosis not present

## 2017-03-20 DIAGNOSIS — Z0000 Encounter for general adult medical examination without abnormal findings: Secondary | ICD-10-CM | POA: Diagnosis not present

## 2017-03-20 DIAGNOSIS — Z23 Encounter for immunization: Secondary | ICD-10-CM | POA: Diagnosis not present

## 2017-03-20 DIAGNOSIS — E7800 Pure hypercholesterolemia, unspecified: Secondary | ICD-10-CM | POA: Diagnosis not present

## 2017-03-20 DIAGNOSIS — M4004 Postural kyphosis, thoracic region: Secondary | ICD-10-CM | POA: Diagnosis not present

## 2017-03-20 DIAGNOSIS — F5101 Primary insomnia: Secondary | ICD-10-CM | POA: Diagnosis not present

## 2017-03-20 DIAGNOSIS — M4124 Other idiopathic scoliosis, thoracic region: Secondary | ICD-10-CM | POA: Diagnosis not present

## 2017-03-20 DIAGNOSIS — Z1331 Encounter for screening for depression: Secondary | ICD-10-CM | POA: Diagnosis not present

## 2017-04-05 DIAGNOSIS — M546 Pain in thoracic spine: Secondary | ICD-10-CM | POA: Diagnosis not present

## 2017-04-05 DIAGNOSIS — M4004 Postural kyphosis, thoracic region: Secondary | ICD-10-CM | POA: Diagnosis not present

## 2017-04-05 DIAGNOSIS — M4124 Other idiopathic scoliosis, thoracic region: Secondary | ICD-10-CM | POA: Diagnosis not present

## 2017-04-11 ENCOUNTER — Other Ambulatory Visit: Payer: Medicare Other

## 2017-04-19 DIAGNOSIS — M546 Pain in thoracic spine: Secondary | ICD-10-CM | POA: Diagnosis not present

## 2017-04-19 DIAGNOSIS — M4004 Postural kyphosis, thoracic region: Secondary | ICD-10-CM | POA: Diagnosis not present

## 2017-04-19 DIAGNOSIS — M4124 Other idiopathic scoliosis, thoracic region: Secondary | ICD-10-CM | POA: Diagnosis not present

## 2017-04-24 ENCOUNTER — Other Ambulatory Visit: Payer: Medicare Other

## 2017-05-01 ENCOUNTER — Other Ambulatory Visit: Payer: Medicare Other

## 2017-06-07 DIAGNOSIS — M4124 Other idiopathic scoliosis, thoracic region: Secondary | ICD-10-CM | POA: Diagnosis not present

## 2017-06-07 DIAGNOSIS — M546 Pain in thoracic spine: Secondary | ICD-10-CM | POA: Diagnosis not present

## 2017-06-07 DIAGNOSIS — M4004 Postural kyphosis, thoracic region: Secondary | ICD-10-CM | POA: Diagnosis not present

## 2017-06-11 ENCOUNTER — Ambulatory Visit
Admission: RE | Admit: 2017-06-11 | Discharge: 2017-06-11 | Disposition: A | Discharge status: Home / Self Care | Payer: Medicare Other | Source: Ambulatory Visit | Attending: Geriatric Medicine | Admitting: Geriatric Medicine

## 2017-06-11 DIAGNOSIS — R1314 Dysphagia, pharyngoesophageal phase: Secondary | ICD-10-CM

## 2017-06-11 DIAGNOSIS — K449 Diaphragmatic hernia without obstruction or gangrene: Secondary | ICD-10-CM | POA: Diagnosis not present

## 2017-06-14 DIAGNOSIS — M4124 Other idiopathic scoliosis, thoracic region: Secondary | ICD-10-CM | POA: Diagnosis not present

## 2017-06-14 DIAGNOSIS — M546 Pain in thoracic spine: Secondary | ICD-10-CM | POA: Diagnosis not present

## 2017-06-14 DIAGNOSIS — M4004 Postural kyphosis, thoracic region: Secondary | ICD-10-CM | POA: Diagnosis not present

## 2017-06-25 DIAGNOSIS — M4004 Postural kyphosis, thoracic region: Secondary | ICD-10-CM | POA: Diagnosis not present

## 2017-06-25 DIAGNOSIS — M546 Pain in thoracic spine: Secondary | ICD-10-CM | POA: Diagnosis not present

## 2017-06-25 DIAGNOSIS — M4124 Other idiopathic scoliosis, thoracic region: Secondary | ICD-10-CM | POA: Diagnosis not present

## 2017-06-28 DIAGNOSIS — M4004 Postural kyphosis, thoracic region: Secondary | ICD-10-CM | POA: Diagnosis not present

## 2017-06-28 DIAGNOSIS — M4124 Other idiopathic scoliosis, thoracic region: Secondary | ICD-10-CM | POA: Diagnosis not present

## 2017-06-28 DIAGNOSIS — M546 Pain in thoracic spine: Secondary | ICD-10-CM | POA: Diagnosis not present

## 2017-07-02 DIAGNOSIS — M546 Pain in thoracic spine: Secondary | ICD-10-CM | POA: Diagnosis not present

## 2017-07-02 DIAGNOSIS — M4124 Other idiopathic scoliosis, thoracic region: Secondary | ICD-10-CM | POA: Diagnosis not present

## 2017-07-02 DIAGNOSIS — M4004 Postural kyphosis, thoracic region: Secondary | ICD-10-CM | POA: Diagnosis not present

## 2017-07-05 DIAGNOSIS — M4124 Other idiopathic scoliosis, thoracic region: Secondary | ICD-10-CM | POA: Diagnosis not present

## 2017-07-05 DIAGNOSIS — M4004 Postural kyphosis, thoracic region: Secondary | ICD-10-CM | POA: Diagnosis not present

## 2017-07-05 DIAGNOSIS — M546 Pain in thoracic spine: Secondary | ICD-10-CM | POA: Diagnosis not present

## 2017-07-06 ENCOUNTER — Ambulatory Visit
Admission: RE | Admit: 2017-07-06 | Discharge: 2017-07-06 | Disposition: A | Discharge status: Home / Self Care | Payer: Medicare Other | Source: Ambulatory Visit | Attending: Geriatric Medicine | Admitting: Geriatric Medicine

## 2017-07-06 ENCOUNTER — Other Ambulatory Visit: Payer: Self-pay | Admitting: Geriatric Medicine

## 2017-07-06 DIAGNOSIS — M5442 Lumbago with sciatica, left side: Secondary | ICD-10-CM | POA: Diagnosis not present

## 2017-07-06 DIAGNOSIS — M5136 Other intervertebral disc degeneration, lumbar region: Secondary | ICD-10-CM | POA: Diagnosis not present

## 2017-07-06 DIAGNOSIS — D539 Nutritional anemia, unspecified: Secondary | ICD-10-CM | POA: Diagnosis not present

## 2017-07-06 DIAGNOSIS — Z79899 Other long term (current) drug therapy: Secondary | ICD-10-CM | POA: Diagnosis not present

## 2017-07-06 DIAGNOSIS — M810 Age-related osteoporosis without current pathological fracture: Secondary | ICD-10-CM | POA: Diagnosis not present

## 2017-07-12 DIAGNOSIS — M5442 Lumbago with sciatica, left side: Secondary | ICD-10-CM | POA: Diagnosis not present

## 2017-07-12 DIAGNOSIS — M810 Age-related osteoporosis without current pathological fracture: Secondary | ICD-10-CM | POA: Diagnosis not present

## 2017-07-19 DIAGNOSIS — M4180 Other forms of scoliosis, site unspecified: Secondary | ICD-10-CM | POA: Diagnosis not present

## 2017-07-19 DIAGNOSIS — M5432 Sciatica, left side: Secondary | ICD-10-CM | POA: Diagnosis not present

## 2017-07-25 DIAGNOSIS — D649 Anemia, unspecified: Secondary | ICD-10-CM | POA: Diagnosis not present

## 2017-07-25 DIAGNOSIS — Z79899 Other long term (current) drug therapy: Secondary | ICD-10-CM | POA: Diagnosis not present

## 2017-07-25 DIAGNOSIS — M810 Age-related osteoporosis without current pathological fracture: Secondary | ICD-10-CM | POA: Diagnosis not present

## 2017-07-25 DIAGNOSIS — M5442 Lumbago with sciatica, left side: Secondary | ICD-10-CM | POA: Diagnosis not present

## 2017-07-30 DIAGNOSIS — M4004 Postural kyphosis, thoracic region: Secondary | ICD-10-CM | POA: Diagnosis not present

## 2017-07-30 DIAGNOSIS — M546 Pain in thoracic spine: Secondary | ICD-10-CM | POA: Diagnosis not present

## 2017-07-30 DIAGNOSIS — M4124 Other idiopathic scoliosis, thoracic region: Secondary | ICD-10-CM | POA: Diagnosis not present

## 2017-08-06 DIAGNOSIS — M4124 Other idiopathic scoliosis, thoracic region: Secondary | ICD-10-CM | POA: Diagnosis not present

## 2017-08-06 DIAGNOSIS — M4004 Postural kyphosis, thoracic region: Secondary | ICD-10-CM | POA: Diagnosis not present

## 2017-08-06 DIAGNOSIS — M546 Pain in thoracic spine: Secondary | ICD-10-CM | POA: Diagnosis not present

## 2017-08-09 DIAGNOSIS — M546 Pain in thoracic spine: Secondary | ICD-10-CM | POA: Diagnosis not present

## 2017-08-09 DIAGNOSIS — M4004 Postural kyphosis, thoracic region: Secondary | ICD-10-CM | POA: Diagnosis not present

## 2017-08-09 DIAGNOSIS — M4124 Other idiopathic scoliosis, thoracic region: Secondary | ICD-10-CM | POA: Diagnosis not present

## 2017-08-13 DIAGNOSIS — M4004 Postural kyphosis, thoracic region: Secondary | ICD-10-CM | POA: Diagnosis not present

## 2017-08-13 DIAGNOSIS — M546 Pain in thoracic spine: Secondary | ICD-10-CM | POA: Diagnosis not present

## 2017-08-13 DIAGNOSIS — M4124 Other idiopathic scoliosis, thoracic region: Secondary | ICD-10-CM | POA: Diagnosis not present

## 2017-08-16 DIAGNOSIS — M4124 Other idiopathic scoliosis, thoracic region: Secondary | ICD-10-CM | POA: Diagnosis not present

## 2017-08-16 DIAGNOSIS — M4004 Postural kyphosis, thoracic region: Secondary | ICD-10-CM | POA: Diagnosis not present

## 2017-08-16 DIAGNOSIS — M546 Pain in thoracic spine: Secondary | ICD-10-CM | POA: Diagnosis not present

## 2017-08-20 DIAGNOSIS — H938X2 Other specified disorders of left ear: Secondary | ICD-10-CM | POA: Diagnosis not present

## 2017-08-20 DIAGNOSIS — H61303 Acquired stenosis of external ear canal, unspecified, bilateral: Secondary | ICD-10-CM | POA: Diagnosis not present

## 2017-08-20 DIAGNOSIS — M4124 Other idiopathic scoliosis, thoracic region: Secondary | ICD-10-CM | POA: Diagnosis not present

## 2017-08-20 DIAGNOSIS — Z9622 Myringotomy tube(s) status: Secondary | ICD-10-CM | POA: Diagnosis not present

## 2017-08-20 DIAGNOSIS — M4004 Postural kyphosis, thoracic region: Secondary | ICD-10-CM | POA: Diagnosis not present

## 2017-08-20 DIAGNOSIS — M546 Pain in thoracic spine: Secondary | ICD-10-CM | POA: Diagnosis not present

## 2017-08-20 DIAGNOSIS — M545 Low back pain: Secondary | ICD-10-CM | POA: Diagnosis not present

## 2017-08-20 DIAGNOSIS — G8929 Other chronic pain: Secondary | ICD-10-CM | POA: Diagnosis not present

## 2017-08-23 DIAGNOSIS — M4004 Postural kyphosis, thoracic region: Secondary | ICD-10-CM | POA: Diagnosis not present

## 2017-08-23 DIAGNOSIS — M546 Pain in thoracic spine: Secondary | ICD-10-CM | POA: Diagnosis not present

## 2017-08-23 DIAGNOSIS — M4124 Other idiopathic scoliosis, thoracic region: Secondary | ICD-10-CM | POA: Diagnosis not present

## 2017-08-27 DIAGNOSIS — M546 Pain in thoracic spine: Secondary | ICD-10-CM | POA: Diagnosis not present

## 2017-08-27 DIAGNOSIS — M4124 Other idiopathic scoliosis, thoracic region: Secondary | ICD-10-CM | POA: Diagnosis not present

## 2017-08-27 DIAGNOSIS — M4004 Postural kyphosis, thoracic region: Secondary | ICD-10-CM | POA: Diagnosis not present

## 2017-08-30 DIAGNOSIS — M4004 Postural kyphosis, thoracic region: Secondary | ICD-10-CM | POA: Diagnosis not present

## 2017-08-30 DIAGNOSIS — M546 Pain in thoracic spine: Secondary | ICD-10-CM | POA: Diagnosis not present

## 2017-08-30 DIAGNOSIS — M4124 Other idiopathic scoliosis, thoracic region: Secondary | ICD-10-CM | POA: Diagnosis not present

## 2017-09-05 DIAGNOSIS — M546 Pain in thoracic spine: Secondary | ICD-10-CM | POA: Diagnosis not present

## 2017-09-05 DIAGNOSIS — M4004 Postural kyphosis, thoracic region: Secondary | ICD-10-CM | POA: Diagnosis not present

## 2017-09-05 DIAGNOSIS — M4124 Other idiopathic scoliosis, thoracic region: Secondary | ICD-10-CM | POA: Diagnosis not present

## 2017-09-17 ENCOUNTER — Encounter: Payer: Self-pay | Admitting: Gastroenterology

## 2017-09-17 DIAGNOSIS — M546 Pain in thoracic spine: Secondary | ICD-10-CM | POA: Diagnosis not present

## 2017-09-17 DIAGNOSIS — M4124 Other idiopathic scoliosis, thoracic region: Secondary | ICD-10-CM | POA: Diagnosis not present

## 2017-09-17 DIAGNOSIS — M4004 Postural kyphosis, thoracic region: Secondary | ICD-10-CM | POA: Diagnosis not present

## 2017-10-02 DIAGNOSIS — M4124 Other idiopathic scoliosis, thoracic region: Secondary | ICD-10-CM | POA: Diagnosis not present

## 2017-10-02 DIAGNOSIS — M4004 Postural kyphosis, thoracic region: Secondary | ICD-10-CM | POA: Diagnosis not present

## 2017-10-02 DIAGNOSIS — M546 Pain in thoracic spine: Secondary | ICD-10-CM | POA: Diagnosis not present

## 2017-10-17 DIAGNOSIS — M546 Pain in thoracic spine: Secondary | ICD-10-CM | POA: Diagnosis not present

## 2017-10-17 DIAGNOSIS — M4124 Other idiopathic scoliosis, thoracic region: Secondary | ICD-10-CM | POA: Diagnosis not present

## 2017-10-17 DIAGNOSIS — M4004 Postural kyphosis, thoracic region: Secondary | ICD-10-CM | POA: Diagnosis not present

## 2017-10-26 DIAGNOSIS — M4004 Postural kyphosis, thoracic region: Secondary | ICD-10-CM | POA: Diagnosis not present

## 2017-10-26 DIAGNOSIS — M546 Pain in thoracic spine: Secondary | ICD-10-CM | POA: Diagnosis not present

## 2017-10-26 DIAGNOSIS — M4124 Other idiopathic scoliosis, thoracic region: Secondary | ICD-10-CM | POA: Diagnosis not present

## 2017-11-30 DIAGNOSIS — M810 Age-related osteoporosis without current pathological fracture: Secondary | ICD-10-CM | POA: Diagnosis not present

## 2017-11-30 DIAGNOSIS — L299 Pruritus, unspecified: Secondary | ICD-10-CM | POA: Diagnosis not present

## 2017-11-30 DIAGNOSIS — W57XXXA Bitten or stung by nonvenomous insect and other nonvenomous arthropods, initial encounter: Secondary | ICD-10-CM | POA: Diagnosis not present

## 2018-02-21 ENCOUNTER — Other Ambulatory Visit: Payer: Self-pay | Admitting: Geriatric Medicine

## 2018-02-21 ENCOUNTER — Encounter (HOSPITAL_COMMUNITY): Payer: Self-pay | Admitting: Emergency Medicine

## 2018-02-21 ENCOUNTER — Observation Stay (HOSPITAL_COMMUNITY)
Admission: EM | Admit: 2018-02-21 | Discharge: 2018-02-23 | Disposition: A | Discharge status: Home / Self Care | Payer: Medicare Other | Attending: Internal Medicine | Admitting: Internal Medicine

## 2018-02-21 ENCOUNTER — Other Ambulatory Visit: Payer: Self-pay

## 2018-02-21 DIAGNOSIS — R252 Cramp and spasm: Secondary | ICD-10-CM | POA: Diagnosis not present

## 2018-02-21 DIAGNOSIS — I10 Essential (primary) hypertension: Secondary | ICD-10-CM | POA: Insufficient documentation

## 2018-02-21 DIAGNOSIS — Q438 Other specified congenital malformations of intestine: Secondary | ICD-10-CM | POA: Insufficient documentation

## 2018-02-21 DIAGNOSIS — D649 Anemia, unspecified: Secondary | ICD-10-CM

## 2018-02-21 DIAGNOSIS — R609 Edema, unspecified: Secondary | ICD-10-CM

## 2018-02-21 DIAGNOSIS — R030 Elevated blood-pressure reading, without diagnosis of hypertension: Secondary | ICD-10-CM | POA: Insufficient documentation

## 2018-02-21 DIAGNOSIS — R5383 Other fatigue: Secondary | ICD-10-CM | POA: Diagnosis not present

## 2018-02-21 DIAGNOSIS — R600 Localized edema: Secondary | ICD-10-CM | POA: Diagnosis not present

## 2018-02-21 DIAGNOSIS — Z791 Long term (current) use of non-steroidal anti-inflammatories (NSAID): Secondary | ICD-10-CM | POA: Diagnosis not present

## 2018-02-21 DIAGNOSIS — Z79899 Other long term (current) drug therapy: Secondary | ICD-10-CM | POA: Diagnosis not present

## 2018-02-21 DIAGNOSIS — M1990 Unspecified osteoarthritis, unspecified site: Secondary | ICD-10-CM | POA: Insufficient documentation

## 2018-02-21 DIAGNOSIS — K5730 Diverticulosis of large intestine without perforation or abscess without bleeding: Secondary | ICD-10-CM | POA: Diagnosis not present

## 2018-02-21 DIAGNOSIS — M810 Age-related osteoporosis without current pathological fracture: Secondary | ICD-10-CM | POA: Insufficient documentation

## 2018-02-21 DIAGNOSIS — D509 Iron deficiency anemia, unspecified: Secondary | ICD-10-CM | POA: Diagnosis not present

## 2018-02-21 DIAGNOSIS — K449 Diaphragmatic hernia without obstruction or gangrene: Secondary | ICD-10-CM | POA: Diagnosis not present

## 2018-02-21 DIAGNOSIS — K219 Gastro-esophageal reflux disease without esophagitis: Secondary | ICD-10-CM | POA: Diagnosis not present

## 2018-02-21 DIAGNOSIS — M419 Scoliosis, unspecified: Secondary | ICD-10-CM | POA: Diagnosis not present

## 2018-02-21 DIAGNOSIS — F419 Anxiety disorder, unspecified: Secondary | ICD-10-CM | POA: Insufficient documentation

## 2018-02-21 DIAGNOSIS — K224 Dyskinesia of esophagus: Secondary | ICD-10-CM | POA: Diagnosis not present

## 2018-02-21 LAB — COMPREHENSIVE METABOLIC PANEL
ALK PHOS: 48 U/L (ref 38–126)
ALT: 15 U/L (ref 0–44)
ANION GAP: 9 (ref 5–15)
AST: 21 U/L (ref 15–41)
Albumin: 3 g/dL — ABNORMAL LOW (ref 3.5–5.0)
BILIRUBIN TOTAL: 0.5 mg/dL (ref 0.3–1.2)
BUN: 18 mg/dL (ref 8–23)
CALCIUM: 8.3 mg/dL — AB (ref 8.9–10.3)
CO2: 24 mmol/L (ref 22–32)
Chloride: 101 mmol/L (ref 98–111)
Creatinine, Ser: 1.13 mg/dL — ABNORMAL HIGH (ref 0.44–1.00)
GFR, EST AFRICAN AMERICAN: 52 mL/min — AB (ref >60)
GFR, EST NON AFRICAN AMERICAN: 45 mL/min — AB (ref >60)
GLUCOSE: 119 mg/dL — AB (ref 70–99)
POTASSIUM: 3.9 mmol/L (ref 3.5–5.1)
Sodium: 134 mmol/L — ABNORMAL LOW (ref 135–145)
TOTAL PROTEIN: 5.5 g/dL — AB (ref 6.5–8.1)

## 2018-02-21 LAB — CBC WITH DIFFERENTIAL/PLATELET
ABS IMMATURE GRANULOCYTES: 0 10*3/uL (ref 0.0–0.1)
BASOS ABS: 0.1 10*3/uL (ref 0.0–0.1)
BASOS PCT: 1 %
Eosinophils Absolute: 0.1 10*3/uL (ref 0.0–0.7)
Eosinophils Relative: 1 %
HCT: 19.7 % — ABNORMAL LOW (ref 36.0–46.0)
HEMOGLOBIN: 5.9 g/dL — AB (ref 12.0–15.0)
Immature Granulocytes: 1 %
Lymphocytes Relative: 9 %
Lymphs Abs: 0.7 10*3/uL (ref 0.7–4.0)
MCH: 25 pg — AB (ref 26.0–34.0)
MCHC: 29.9 g/dL — ABNORMAL LOW (ref 30.0–36.0)
MCV: 83.5 fL (ref 78.0–100.0)
MONO ABS: 0.8 10*3/uL (ref 0.1–1.0)
Monocytes Relative: 9 %
NEUTROS PCT: 81 %
Neutro Abs: 6.9 10*3/uL (ref 1.7–7.7)
PLATELETS: 402 10*3/uL — AB (ref 150–400)
RBC: 2.36 MIL/uL — AB (ref 3.87–5.11)
RDW: 13.6 % (ref 11.5–15.5)
WBC: 8.6 10*3/uL (ref 4.0–10.5)

## 2018-02-21 LAB — IRON AND TIBC
IRON: 13 ug/dL — AB (ref 28–170)
SATURATION RATIOS: 3 % — AB (ref 10.4–31.8)
TIBC: 409 ug/dL (ref 250–450)
UIBC: 396 ug/dL

## 2018-02-21 LAB — FERRITIN: Ferritin: 5 ng/mL — ABNORMAL LOW (ref 11–307)

## 2018-02-21 LAB — PREPARE RBC (CROSSMATCH)

## 2018-02-21 LAB — ABO/RH: ABO/RH(D): O POS

## 2018-02-21 LAB — POC OCCULT BLOOD, ED: FECAL OCCULT BLD: NEGATIVE

## 2018-02-21 MED ORDER — SODIUM CHLORIDE 0.9 % IV SOLN
10.0000 mL/h | Freq: Once | INTRAVENOUS | Status: AC
  Administered 2018-02-21: 10 mL/h via INTRAVENOUS

## 2018-02-21 MED ORDER — ALPRAZOLAM 0.5 MG PO TABS
0.5000 mg | ORAL_TABLET | Freq: Every day | ORAL | Status: DC
  Administered 2018-02-21 – 2018-02-22 (×2): 0.5 mg via ORAL
  Filled 2018-02-21 (×2): qty 1

## 2018-02-21 MED ORDER — SODIUM CHLORIDE 0.9 % IV SOLN
INTRAVENOUS | Status: DC
  Administered 2018-02-21: 19:00:00 via INTRAVENOUS

## 2018-02-21 MED ORDER — PANTOPRAZOLE SODIUM 40 MG PO TBEC
40.0000 mg | DELAYED_RELEASE_TABLET | Freq: Two times a day (BID) | ORAL | Status: DC
  Administered 2018-02-21 – 2018-02-22 (×3): 40 mg via ORAL
  Filled 2018-02-21 (×3): qty 1

## 2018-02-21 NOTE — Consult Note (Signed)
[facility] Consult: 8:07 AM 02/22/2018  LOS: 0 days   Referring Provider: Dr Candiss Norse Primary Care Physician:  [name], MD Primary Gastroenterologist:  Dr. Ardis Hughs    Reason for Consultation:  Anemia.     HPI: Jennifer Kirk is a 82 y.o. female.  PMH scoliosis, DDD.   Screening colonoscopy in 2009: small int hemorrhoids only Esophagrams for dysphagia, ordered by PMD, in 2010 and 06/2017 show GER, dysmotility and non-passage of tablet, large/paraesophageal hernia.  No PPI or H2 blocker on home med list.  Taking Meloxicam 1 x daily for back pain since 06/2017.    Several months fatigue, weakness.  resp changes noted as more difficulty phrasing her singing due to running out of breath.  9/18 while at PT: cramping in legs, swelling in ankles.  Sent to PCP by PT. Labs obtained.    Told by MD to go to ED for Hgb, 6.9.   Hgb 5.9, MCV 83 in ED.  Hgb 9 after 2 U PRBC.  No labs for comparison but pt says Hgb generally runs 11 to 12 Ferritin 5, iron 13.   FOBT Negative.  Slight renal compromise c/w stage 3a CKD.   Good appetite but over the years eats less.  Eats slowly and chews thoroughly to deal with dysphagia.  Wt loss ~ 2# yearly for last several years.  Stopped PPI due to concern for calcium absorption and her osteoporosis (on Prolia shots, no oral meds for this). No uusual bleeding or bruising.  Regular brown stools with daily prunes.  Reflux sxs controlled with Zantac 150 mg daily and mix of Ginger, honey, vinegar.        Past Medical History:  Diagnosis Date  . Anxiety   . Degenerative disc disease   . GERD (gastroesophageal reflux disease)   . Hiatal hernia 2010   GERD,esophageal dysmotility and paraesophageal hernia on esophogram  . Osteopetrosis    degenerative spine disease, lumbar  .  Scoliosis     Past Surgical History:  Procedure Laterality Date  . BREAST EXCISIONAL BIOPSY Bilateral 08/2002   path: fibrocystic changes.  sclerosing adenosis with microcalcification.  Marland Kitchen BREAST LUMPECTOMY    . COLONOSCOPY  10/2007   screening study.  Dr Owens Loffler.  small internal hemorrhoids, no polyps, no diverticulosis.      Prior to Admission medications   Medication Sig Start Date End Date Taking? Authorizing Provider  acidophilus (RISAQUAD) CAPS Take 1 capsule by mouth daily.   Yes [provider]  ALPRAZolam Duanne Moron) 0.5 MG tablet Take 0.5 mg by mouth at bedtime.   Yes [provider]  BIOTIN PO Take 1 capsule by mouth daily.   Yes [provider]  CALCIUM PO Take 2 tablets by mouth 2 (two) times daily.   Yes [provider]  cholecalciferol (VITAMIN D) 400 UNITS TABS Take 400 Units by mouth daily.   Yes [provider]  denosumab (PROLIA) 60 MG/ML SOSY injection Inject 60 mg into the skin every 6 (six) months.   Yes [provider]  Ginger,  Zingiber officinalis, (GINGER PO) Take 1 tablet by mouth daily.   Yes [provider]  KRILL OIL PO Take 1 capsule by mouth daily.   Yes [provider]  Multiple Vitamin (MULTIVITAMIN WITH MINERALS) TABS Take 1 tablet by mouth daily.   Yes [provider]  vitamin C (ASCORBIC ACID) 500 MG tablet Take 500 mg by mouth daily.   Yes [provider]    Scheduled Meds: . ALPRAZolam  0.5 mg Oral QHS  . pantoprazole  40 mg Oral BID AC   Infusions: . sodium chloride 50 mL/hr at 02/21/18 1901   PRN Meds:    Allergies as of 02/21/2018 - Review Complete 02/21/2018  Allergen Reaction Noted  . Codeine Nausea And Vomiting 08/03/2011  . Penicillins Rash 08/03/2011    Family History  Problem Relation Age of Onset  . Breast cancer Neg Hx     Social History   Socioeconomic History  . Marital status: Widowed    Spouse name: Not on file  . Number  of children: Not on file  . Years of education: Not on file  . Highest education level: Not on file  Occupational History  . Not on file  Social Needs  . Financial resource strain: Not on file  . Food insecurity:    Worry: Not on file    Inability: Not on file  . Transportation needs:    Medical: Not on file    Non-medical: Not on file  Tobacco Use  . Smoking status: Never Smoker  Substance and Sexual Activity  . Alcohol use: Yes  . Drug use: No  . Sexual activity: Not on file  Lifestyle  . Physical activity:    Days per week: Not on file    Minutes per session: Not on file  . Stress: Not on file  Relationships  . Social connections:    Talks on phone: Not on file    Gets together: Not on file    Attends religious service: Not on file    Active member of club or organization: Not on file    Attends meetings of clubs or organizations: Not on file    Relationship status: Not on file  . Intimate partner violence:    Fear of current or ex partner: Not on file    Emotionally abused: Not on file    Physically abused: Not on file    Forced sexual activity: Not on file  Other Topics Concern  . Not on file  Social History Narrative  . Not on file    REVIEW OF SYSTEMS: Constitutional:  Fatigue but no profound weakness.   ENT:  No nose bleeds Pulm:  Dry cough, some but not profound weakness.  CV:  No palpitations, no LE edema.  GU:  No hematuria, no frequency GI:  Per HPI Heme:  Per HPI   Transfusions:  None in past, no iron.   Neuro:  No headaches, no peripheral tingling or numbness Derm:  No itching, no rash or sores.  Endocrine:  No sweats or chills.  No polyuria or dysuria Immunization:  Not queried. Travel:  None beyond local counties in last few months.    PHYSICAL EXAM: Vital signs in last 24 hours: Vitals:   02/22/18 0204 02/22/18 0448  BP: 125/70 129/72  Pulse: 78 78  Resp: 14 17  Temp: 98.3 F (36.8 C) 98.2 F (36.8 C)  SpO2: 97% 98%   Wt Readings  from Last 3 Encounters:  02/21/18  49.2 kg  10/23/16 45.4 kg  07/15/15 49.9 kg    General: pleasant.  Cooperative.  Thin, looks well.   Head:  Clear bil.  No dyspnea or cough  Eyes:  No icterus or pallor Ears:  Not HOH  Nose:  No congestion or discharge.   Mouth:  Oropharynx moist, pink, clear.   Neck:  No JVD.  No mass, no TMG Lungs:  Clear bil.  No labored breathing.  Scant cough. Heart: RRR.  No mrg.  S1, s2 present Abdomen:  Soft, NT, ND.  No mass or HSM.  No bruits, hernias.  Active BS.   Rectal: deferred   Musc/Skeltl: no joint swelling or redness.  Marked scoliosis Extremities:  No CCE  Neurologic:  Oriented x 3.  Fully alert.  Moves all 4 limbs, strength not tested.  No tremor Skin:  No rash, sores or significant bruising Tattoos:  none Nodes:  No cervical adenopathy.     Psych:  Pleasant.  Cooperative.  Calm.    Intake/Output from previous day: 09/19 0701 - 09/20 0700 In: 771.7 [I.V.:405.8; Blood:365.9] Out: -  Intake/Output this shift: No intake/output data recorded.  LAB RESULTS: Recent Labs    02/21/18 1450 02/22/18 0438  WBC 8.6 7.0  HGB 5.9* 9.0*  HCT 19.7* 28.3*  PLT 402* 311   BMET Lab Results  Component Value Date   NA 134 (L) 02/21/2018   K 3.9 02/21/2018   CL 101 02/21/2018   CO2 24 02/21/2018   GLUCOSE 119 (H) 02/21/2018   BUN 18 02/21/2018   CREATININE 1.13 (H) 02/21/2018   CALCIUM 8.3 (L) 02/21/2018   LFT Recent Labs    02/21/18 1450  PROT 5.5*  ALBUMIN 3.0*  AST 21  ALT 15  ALKPHOS 48  BILITOT 0.5    RADIOLOGY STUDIES: No results found.   IMPRESSION:   *   Iron def anemia.  FOBT neg on 1 of 1 specimens.  Excellent response to 2 U PRBCs.   Given she has long standing large HH, described as paraesophageal, suspect [place] lesions.    *   Osteoarthritis, osteoporosis, scoliosis.  Taking prescribed doses of Meloxicam for back pain.  No oral osteoporosis meds.     PLAN:     *   EGD this morning.    *   Consider  Feraheme infusion now and in 1 week    Azucena Freed  02/22/2018, 8:07 AM Phone [phone]

## 2018-02-21 NOTE — ED Triage Notes (Signed)
Pt called by doctor and told to come to ER. Lab work done today and hgb 6.9. Been taking meloxicam for back pain. Pt feeling dizzy and weak and pale.

## 2018-02-21 NOTE — H&P (Signed)
[facility] History and Physical  KYLIAH Kirk SJG:[id number] DOB: 1936-02-06 DOA: 02/21/2018  Referring physician:  PCP: Jennifer Manes, MD  Specialists:   Chief Complaint: Abnormal labs. Hg 6.9 at PCP office   HPI: Jennifer Kirk is a 82 y.o. female with PMH of Anxiety, Osteoporosis, Scoliosis, back pains, presented from abnormal labs from PCP office. Patient states that she has been progressively short of breath, fatigued, tired, episodes of dizziness for few month. She went to see her primary care doctor. She was referred to emergency room since her lab work showed Hg at 6.9. Patient denies any blood in stool, no vomiting, no hematemesis. No hematuria. Last bowel movement on 9/18, no blood in stool. She reports taking daily meloxicam for the last 6 month for her back pains. She reports mild epigastric abdominal discomfort. Denies pains. She reports history of chronic GERD.  No acute chest pains, no shortness of breath.  -ED: repeat Hg is 5.9. Stool occult blood test is negative. Started blood TF. 2U. hospitalist is called for admission   Review of Systems: The patient denies anorexia, fever, weight loss,, vision loss, decreased hearing, hoarseness, chest pain, syncope, dyspnea on exertion, peripheral edema, balance deficits, hemoptysis, abdominal pain, melena, hematochezia, severe indigestion/heartburn, hematuria, incontinence, genital sores, muscle weakness, suspicious skin lesions, transient blindness, difficulty walking, depression, unusual weight change, abnormal bleeding, enlarged lymph nodes, angioedema, and breast masses.    Past Medical History:  Diagnosis Date  . Anxiety   . Degenerative disc disease   . GERD (gastroesophageal reflux disease)   . Hiatal hernia 2010   GERD,esophageal dysmotility and paraesophageal hernia on esophogram  . Osteopetrosis   . Scoliosis    Past Surgical History:  Procedure Laterality Date  . BREAST EXCISIONAL BIOPSY Bilateral  08/2002   path: fibrocystic changes.  sclerosing adenosis with microcalcification.  Marland Kitchen BREAST LUMPECTOMY    . COLONOSCOPY  10/2007   screening study.  Dr Owens Loffler.  small internal hemorrhoids, no polyps, no diverticulosis.     Social History:  reports that she has never smoked. She does not have any smokeless tobacco history on file. She reports that she drinks alcohol. She reports that she does not use drugs. Home;  where does patient live--home, ALF, SNF? and with whom if at home? Yes;  Can patient participate in ADLs?  Allergies  Allergen Reactions  . Codeine Nausea And Vomiting  . Penicillins Rash    Has patient had a PCN reaction causing immediate rash, facial/tongue/throat swelling, SOB or lightheadedness with hypotension: Yes Has patient had a PCN reaction causing severe rash involving mucus membranes or skin necrosis: No Has patient had a PCN reaction that required hospitalization: No Has patient had a PCN reaction occurring within the last 10 years: No If all of the above answers are "NO", then may proceed with Cephalosporin use.     Family History  Problem Relation Age of Onset  . Breast cancer Neg Hx     (be sure to complete)  Prior to Admission medications   Medication Sig Start Date End Date Taking? Authorizing Provider  acidophilus (RISAQUAD) CAPS Take 1 capsule by mouth daily.   Yes [provider]  ALPRAZolam Duanne Moron) 0.5 MG tablet Take 0.5 mg by mouth at bedtime.   Yes [provider]  BIOTIN PO Take 1 capsule by mouth daily.   Yes [provider]  CALCIUM PO Take 2 tablets by mouth 2 (two) times daily.   Yes [provider]  cholecalciferol (  VITAMIN D) 400 UNITS TABS Take 400 Units by mouth daily.   Yes [provider]  denosumab (PROLIA) 60 MG/ML SOSY injection Inject 60 mg into the skin every 6 (six) months.   Yes [provider]  Ginger, Zingiber officinalis, (GINGER PO) Take 1 tablet by mouth daily.    Yes [provider]  KRILL OIL PO Take 1 capsule by mouth daily.   Yes [provider]  Multiple Vitamin (MULTIVITAMIN WITH MINERALS) TABS Take 1 tablet by mouth daily.   Yes [provider]  vitamin C (ASCORBIC ACID) 500 MG tablet Take 500 mg by mouth daily.   Yes [provider]   Physical Exam: Vitals:   02/21/18 1432  BP: (!) 156/80  Pulse: 100  Resp: 20  Temp: 98.4 F (36.9 C)  SpO2: 99%     General:  Alert, no distress   Eyes: eom-I, perrla   ENT: no ulcers   Neck: supple, no JVD  Cardiovascular: s1,s2 rrr  Respiratory: CTA BL  Abdomen: soft, mild epigastric tenderness   Skin: no rash   Musculoskeletal: no leg edema   Psychiatric: no hallucinations   Neurologic: CN 2-12 intact. Motor 5/5 bl   Labs on Admission:  Basic Metabolic Panel: Recent Labs  Lab 02/21/18 1450  NA 134*  K 3.9  CL 101  CO2 24  GLUCOSE 119*  BUN 18  CREATININE 1.13*  CALCIUM 8.3*   Liver Function Tests: Recent Labs  Lab 02/21/18 1450  AST 21  ALT 15  ALKPHOS 48  BILITOT 0.5  PROT 5.5*  ALBUMIN 3.0*   No results for input(s): LIPASE, AMYLASE in the last 168 hours. No results for input(s): AMMONIA in the last 168 hours. CBC: Recent Labs  Lab 02/21/18 1450  WBC 8.6  NEUTROABS 6.9  HGB 5.9*  HCT 19.7*  MCV 83.5  PLT 402*   Cardiac Enzymes: No results for input(s): CKTOTAL, CKMB, CKMBINDEX, TROPONINI in the last 168 hours.  BNP (last 3 results) No results for input(s): BNP in the last 8760 hours.  ProBNP (last 3 results) No results for input(s): PROBNP in the last 8760 hours.  CBG: No results for input(s): GLUCAP in the last 168 hours.  Radiological Exams on Admission: No results found.  EKG: Independently reviewed.   Assessment/Plan Active Problems:   Anemia   Fatigue   82 y.o. female with PMH of Anxiety, Osteoporosis, Scoliosis, back pains, presented from abnormal labs from PCP office, found to have Hg  5.9  Symptomatic anemia. Hg 5.9. Possible chronic blood loss/iron deficiency. Takes nsaid r/o gastritis or PUD. No s/s of acute bleeding. Occult stool blood test was negative in ED. MCV-83., mild thrombocytosis. Colonoscopy (2009): +hemorrhoids.   -will TF 2 units of PRBCs. Recheck Hg post TF. Monitor closely. Check iron profile, retic counts. Consulted GI for EGD r/o gastritis or PUD. Keep NPO after midnight. PPI  Elevated blood pressure. Denies history of HTN. Monitor. Will start BP meds if needed    GI;  if consultant consulted, please document name and whether formally or informally consulted  Code Status: full  (must indicate code status--if unknown or must be presumed, indicate so) Family Communication: d/w patient, her family, GI (indicate person spoken with, if applicable, with phone number if by telephone) Disposition Plan: home 24-48 hrs  (indicate anticipated LOS)  Time spent: >45 minutes   Kinnie Feil [facility] Pager [phone]  If 7PM-7AM, please contact night-coverage [email or web address] Password [email or web address] 02/21/2018, 4:11 PM

## 2018-02-21 NOTE — ED Notes (Signed)
Attempted to call report to Thurmond Butts and was told he just took a patient off the unit. Will attempt again in a few.

## 2018-02-21 NOTE — H&P (View-Only) (Signed)
[facility] Consult: 8:07 AM 02/22/2018  LOS: 0 days   Referring Provider: Dr Candiss Norse Primary Care Physician:  [name], MD Primary Gastroenterologist:  Dr. Ardis Hughs    Reason for Consultation:  Anemia.     HPI: Jennifer Kirk is a 82 y.o. female.  PMH scoliosis, DDD.   Screening colonoscopy in 2009: small int hemorrhoids only Esophagrams for dysphagia, ordered by PMD, in 2010 and 06/2017 show GER, dysmotility and non-passage of tablet, large/paraesophageal hernia.  No PPI or H2 blocker on home med list.  Taking Meloxicam 1 x daily for back pain since 06/2017.    Several months fatigue, weakness.  resp changes noted as more difficulty phrasing her singing due to running out of breath.  9/18 while at PT: cramping in legs, swelling in ankles.  Sent to PCP by PT. Labs obtained.    Told by MD to go to ED for Hgb, 6.9.   Hgb 5.9, MCV 83 in ED.  Hgb 9 after 2 U PRBC.  No labs for comparison but pt says Hgb generally runs 11 to 12 Ferritin 5, iron 13.   FOBT Negative.  Slight renal compromise c/w stage 3a CKD.   Good appetite but over the years eats less.  Eats slowly and chews thoroughly to deal with dysphagia.  Wt loss ~ 2# yearly for last several years.  Stopped PPI due to concern for calcium absorption and her osteoporosis (on Prolia shots, no oral meds for this). No uusual bleeding or bruising.  Regular brown stools with daily prunes.  Reflux sxs controlled with Zantac 150 mg daily and mix of Ginger, honey, vinegar.        Past Medical History:  Diagnosis Date  . Anxiety   . Degenerative disc disease   . GERD (gastroesophageal reflux disease)   . Hiatal hernia 2010   GERD,esophageal dysmotility and paraesophageal hernia on esophogram  . Osteopetrosis    degenerative spine disease, lumbar  .  Scoliosis     Past Surgical History:  Procedure Laterality Date  . BREAST EXCISIONAL BIOPSY Bilateral 08/2002   path: fibrocystic changes.  sclerosing adenosis with microcalcification.  Marland Kitchen BREAST LUMPECTOMY    . COLONOSCOPY  10/2007   screening study.  Dr Owens Loffler.  small internal hemorrhoids, no polyps, no diverticulosis.      Prior to Admission medications   Medication Sig Start Date End Date Taking? Authorizing Provider  acidophilus (RISAQUAD) CAPS Take 1 capsule by mouth daily.   Yes [provider]  ALPRAZolam Duanne Moron) 0.5 MG tablet Take 0.5 mg by mouth at bedtime.   Yes [provider]  BIOTIN PO Take 1 capsule by mouth daily.   Yes [provider]  CALCIUM PO Take 2 tablets by mouth 2 (two) times daily.   Yes [provider]  cholecalciferol (VITAMIN D) 400 UNITS TABS Take 400 Units by mouth daily.   Yes [provider]  denosumab (PROLIA) 60 MG/ML SOSY injection Inject 60 mg into the skin every 6 (six) months.   Yes [provider]  Ginger,  Zingiber officinalis, (GINGER PO) Take 1 tablet by mouth daily.   Yes [provider]  KRILL OIL PO Take 1 capsule by mouth daily.   Yes [provider]  Multiple Vitamin (MULTIVITAMIN WITH MINERALS) TABS Take 1 tablet by mouth daily.   Yes [provider]  vitamin C (ASCORBIC ACID) 500 MG tablet Take 500 mg by mouth daily.   Yes [provider]    Scheduled Meds: . ALPRAZolam  0.5 mg Oral QHS  . pantoprazole  40 mg Oral BID AC   Infusions: . sodium chloride 50 mL/hr at 02/21/18 1901   PRN Meds:    Allergies as of 02/21/2018 - Review Complete 02/21/2018  Allergen Reaction Noted  . Codeine Nausea And Vomiting 08/03/2011  . Penicillins Rash 08/03/2011    Family History  Problem Relation Age of Onset  . Breast cancer Neg Hx     Social History   Socioeconomic History  . Marital status: Widowed    Spouse name: Not on file  . Number  of children: Not on file  . Years of education: Not on file  . Highest education level: Not on file  Occupational History  . Not on file  Social Needs  . Financial resource strain: Not on file  . Food insecurity:    Worry: Not on file    Inability: Not on file  . Transportation needs:    Medical: Not on file    Non-medical: Not on file  Tobacco Use  . Smoking status: Never Smoker  Substance and Sexual Activity  . Alcohol use: Yes  . Drug use: No  . Sexual activity: Not on file  Lifestyle  . Physical activity:    Days per week: Not on file    Minutes per session: Not on file  . Stress: Not on file  Relationships  . Social connections:    Talks on phone: Not on file    Gets together: Not on file    Attends religious service: Not on file    Active member of club or organization: Not on file    Attends meetings of clubs or organizations: Not on file    Relationship status: Not on file  . Intimate partner violence:    Fear of current or ex partner: Not on file    Emotionally abused: Not on file    Physically abused: Not on file    Forced sexual activity: Not on file  Other Topics Concern  . Not on file  Social History Narrative  . Not on file    REVIEW OF SYSTEMS: Constitutional:  Fatigue but no profound weakness.   ENT:  No nose bleeds Pulm:  Dry cough, some but not profound weakness.  CV:  No palpitations, no LE edema.  GU:  No hematuria, no frequency GI:  Per HPI Heme:  Per HPI   Transfusions:  None in past, no iron.   Neuro:  No headaches, no peripheral tingling or numbness Derm:  No itching, no rash or sores.  Endocrine:  No sweats or chills.  No polyuria or dysuria Immunization:  Not queried. Travel:  None beyond local counties in last few months.    PHYSICAL EXAM: Vital signs in last 24 hours: Vitals:   02/22/18 0204 02/22/18 0448  BP: 125/70 129/72  Pulse: 78 78  Resp: 14 17  Temp: 98.3 F (36.8 C) 98.2 F (36.8 C)  SpO2: 97% 98%   Wt Readings  from Last 3 Encounters:  02/21/18  49.2 kg  10/23/16 45.4 kg  07/15/15 49.9 kg    General: pleasant.  Cooperative.  Thin, looks well.   Head:  Clear bil.  No dyspnea or cough  Eyes:  No icterus or pallor Ears:  Not HOH  Nose:  No congestion or discharge.   Mouth:  Oropharynx moist, pink, clear.   Neck:  No JVD.  No mass, no TMG Lungs:  Clear bil.  No labored breathing.  Scant cough. Heart: RRR.  No mrg.  S1, s2 present Abdomen:  Soft, NT, ND.  No mass or HSM.  No bruits, hernias.  Active BS.   Rectal: deferred   Musc/Skeltl: no joint swelling or redness.  Marked scoliosis Extremities:  No CCE  Neurologic:  Oriented x 3.  Fully alert.  Moves all 4 limbs, strength not tested.  No tremor Skin:  No rash, sores or significant bruising Tattoos:  none Nodes:  No cervical adenopathy.     Psych:  Pleasant.  Cooperative.  Calm.    Intake/Output from previous day: 09/19 0701 - 09/20 0700 In: 771.7 [I.V.:405.8; Blood:365.9] Out: -  Intake/Output this shift: No intake/output data recorded.  LAB RESULTS: Recent Labs    02/21/18 1450 02/22/18 0438  WBC 8.6 7.0  HGB 5.9* 9.0*  HCT 19.7* 28.3*  PLT 402* 311   BMET Lab Results  Component Value Date   NA 134 (L) 02/21/2018   K 3.9 02/21/2018   CL 101 02/21/2018   CO2 24 02/21/2018   GLUCOSE 119 (H) 02/21/2018   BUN 18 02/21/2018   CREATININE 1.13 (H) 02/21/2018   CALCIUM 8.3 (L) 02/21/2018   LFT Recent Labs    02/21/18 1450  PROT 5.5*  ALBUMIN 3.0*  AST 21  ALT 15  ALKPHOS 48  BILITOT 0.5    RADIOLOGY STUDIES: No results found.   IMPRESSION:   *   Iron def anemia.  FOBT neg on 1 of 1 specimens.  Excellent response to 2 U PRBCs.   Given she has long standing large HH, described as paraesophageal, suspect [place] lesions.    *   Osteoarthritis, osteoporosis, scoliosis.  Taking prescribed doses of Meloxicam for back pain.  No oral osteoporosis meds.     PLAN:     *   EGD this morning.    *   Consider  Feraheme infusion now and in 1 week    Azucena Freed  02/22/2018, 8:07 AM Phone [phone]

## 2018-02-21 NOTE — ED Provider Notes (Signed)
[facility] EMERGENCY DEPARTMENT Provider Note   CSN: [id number] Arrival date & time: 02/21/18  1423     History   Chief Complaint Chief Complaint  Patient presents with  . Coagulation Disorder    HPI [name] is a 82 y.o. female.  82 year old female with past medical history including scoliosis, degenerative disc disease, hiatal hernia who presents with anemia.  The patient reports several months of fatigue and generalized weakness.  She endorses some dizziness and lightheadedness.  She denies any chest pain or shortness of breath.  No melena, hematochezia, abdominal pain, vomiting, or urinary problems.  She went to physical therapy yesterday and noted that she was having some leg cramping and swelling of both ankles.  Her physical therapist referred her to her PCP.  They are she had lab work done and was called to come to the ED due to low hemoglobin.  She denies any history of anemia.  No history of GI bleeding.  She has been on meloxicam since February for her back problems.  The history is provided by the patient.    Past Medical History:  Diagnosis Date  . Anxiety   . Degenerative disc disease   . GERD (gastroesophageal reflux disease)   . Hiatal hernia   . Osteopetrosis   . Scoliosis     There are no active problems to display for this patient.   Past Surgical History:  Procedure Laterality Date  . BREAST EXCISIONAL BIOPSY Bilateral   . BREAST LUMPECTOMY       OB History   None      Home Medications    Prior to Admission medications   Medication Sig Start Date End Date Taking? Authorizing Provider  acidophilus (RISAQUAD) CAPS Take 1 capsule by mouth daily.   Yes [provider]  ALPRAZolam Duanne Moron) 0.5 MG tablet Take 0.5 mg by mouth at bedtime.   Yes [provider]  BIOTIN PO Take 1 capsule by mouth daily.   Yes [provider]  CALCIUM PO Take 2 tablets by mouth 2 (two) times daily.   Yes [provider]  cholecalciferol (VITAMIN D) 400 UNITS TABS Take 400 Units by mouth daily.   Yes [provider]  denosumab (PROLIA) 60 MG/ML SOSY injection Inject 60 mg into the skin every 6 (six) months.   Yes [provider]  Ginger, Zingiber officinalis, (GINGER PO) Take 1 tablet by mouth daily.   Yes [provider]  KRILL OIL PO Take 1 capsule by mouth daily.   Yes [provider]  Multiple Vitamin (MULTIVITAMIN WITH MINERALS) TABS Take 1 tablet by mouth daily.   Yes [provider]  vitamin C (ASCORBIC ACID) 500 MG tablet Take 500 mg by mouth daily.   Yes [provider]    Family History Family History  Problem Relation Age of Onset  . Breast cancer Neg Hx     Social History Social History   Tobacco Use  . Smoking status: Never Smoker  Substance Use Topics  . Alcohol use: Yes  . Drug use: No     Allergies   Codeine and Penicillins   Review of Systems Review of Systems All other systems reviewed and are negative except that which was mentioned in HPI   Physical Exam Updated Vital Signs BP (!) 156/80 (BP Location: Left Arm)   Pulse 100   Temp 98.4 F (36.9 C) (Oral)   Resp 20   SpO2 99%  Physical Exam  Constitutional: She is oriented to person, place, and time. She appears well-developed and well-nourished. No distress.  HENT:  Head: Normocephalic and atraumatic.  Moist mucous membranes  Eyes: Pupils are equal, round, and reactive to light. Conjunctivae are normal.  Neck: Neck supple.  Cardiovascular: Normal rate, regular rhythm and normal heart sounds.  No murmur heard. Pulmonary/Chest: Effort normal and breath sounds normal.  Abdominal: Soft. Bowel sounds are normal. She exhibits no distension. There is no tenderness.  Genitourinary: Rectal exam shows guaiac negative stool.  Musculoskeletal: She exhibits no edema.  Neurological: She is alert and oriented to person, place, and time.  Fluent speech    Skin: Skin is warm and dry. There is pallor.  Psychiatric: She has a normal mood and affect. Judgment normal.  Nursing note and vitals reviewed. Chaperone was present during exam.    ED Treatments / Results  Labs (all labs ordered are listed, but only abnormal results are displayed) Labs Reviewed  COMPREHENSIVE METABOLIC PANEL - Abnormal; Notable for the following components:      Result Value   Sodium 134 (*)    Glucose, Bld 119 (*)    Creatinine, Ser 1.13 (*)    Calcium 8.3 (*)    Total Protein 5.5 (*)    Albumin 3.0 (*)    GFR calc non Af Amer 45 (*)    GFR calc Af Amer 52 (*)    All other components within normal limits  CBC WITH DIFFERENTIAL/PLATELET - Abnormal; Notable for the following components:   RBC 2.36 (*)    Hemoglobin 5.9 (*)    HCT 19.7 (*)    MCH 25.0 (*)    MCHC 29.9 (*)    Platelets 402 (*)    All other components within normal limits  IRON AND TIBC  FERRITIN  POC OCCULT BLOOD, ED  TYPE AND SCREEN  ABO/RH  PREPARE RBC (CROSSMATCH)  PREPARE RBC (CROSSMATCH)    EKG None  Radiology No results found.  Procedures .Critical Care Performed by: Sharlett Iles, MD Authorized by: Sharlett Iles, MD   Critical care provider statement:    Critical care time (minutes):  30   Critical care time was exclusive of:  Separately billable procedures and treating other patients   Critical care was necessary to treat or prevent imminent or life-threatening deterioration of the following conditions: symptomatic anemia.   Critical care was time spent personally by me on the following activities:  Development of treatment plan with patient or surrogate, discussions with consultants, examination of patient, obtaining history from patient or surrogate, ordering and performing treatments and interventions, ordering and review of laboratory studies, ordering and review of radiographic studies and review of old charts   (including critical care  time)  Medications Ordered in ED Medications  0.9 %  sodium chloride infusion (has no administration in time range)  0.9 %  sodium chloride infusion (has no administration in time range)     Initial Impression / Assessment and Plan / ED Course  I have reviewed the triage vital signs and the nursing notes.  Pertinent labs  that were available during my care of the patient were reviewed by me and considered in my medical decision making (see chart for details).    PT pale but well-appearing on exam with reassuring vital signs.  Abdomen soft and nontender.  No blood on rectal exam and Hemoccult negative.  Lab work shows normal BUN, creatinine 1.13, normal WBC count, hemoglobin 5.9, platelets  402.  Obtained consent for blood transfusion, ordered 2 units.  She does not appear to be having acute GI bleed, differential includes iron deficiency anemia.  I have ordered iron studies and touched base with GI.  They are available if concern for GI bleeding.  Discussed with [facility] and pt admitted for further care. Final Clinical Impressions(s) / ED Diagnoses   Final diagnoses:  Symptomatic anemia    ED Discharge Orders    None       Little, Wenda Overland, MD 02/21/18 1605

## 2018-02-22 ENCOUNTER — Observation Stay (HOSPITAL_COMMUNITY): Payer: Medicare Other | Admitting: Anesthesiology

## 2018-02-22 ENCOUNTER — Encounter (HOSPITAL_COMMUNITY): Payer: Self-pay | Admitting: Anesthesiology

## 2018-02-22 ENCOUNTER — Other Ambulatory Visit: Payer: Medicare Other

## 2018-02-22 ENCOUNTER — Encounter (HOSPITAL_COMMUNITY)
Admission: EM | Disposition: A | Discharge status: Home / Self Care | Payer: Self-pay | Source: Home / Self Care | Attending: Emergency Medicine

## 2018-02-22 DIAGNOSIS — D649 Anemia, unspecified: Secondary | ICD-10-CM

## 2018-02-22 DIAGNOSIS — I10 Essential (primary) hypertension: Secondary | ICD-10-CM | POA: Diagnosis not present

## 2018-02-22 DIAGNOSIS — K5730 Diverticulosis of large intestine without perforation or abscess without bleeding: Secondary | ICD-10-CM | POA: Diagnosis not present

## 2018-02-22 DIAGNOSIS — R030 Elevated blood-pressure reading, without diagnosis of hypertension: Secondary | ICD-10-CM | POA: Diagnosis not present

## 2018-02-22 DIAGNOSIS — K224 Dyskinesia of esophagus: Secondary | ICD-10-CM | POA: Diagnosis not present

## 2018-02-22 DIAGNOSIS — K449 Diaphragmatic hernia without obstruction or gangrene: Secondary | ICD-10-CM | POA: Diagnosis not present

## 2018-02-22 DIAGNOSIS — K219 Gastro-esophageal reflux disease without esophagitis: Secondary | ICD-10-CM | POA: Diagnosis not present

## 2018-02-22 DIAGNOSIS — R5382 Chronic fatigue, unspecified: Secondary | ICD-10-CM | POA: Diagnosis not present

## 2018-02-22 DIAGNOSIS — Q399 Congenital malformation of esophagus, unspecified: Secondary | ICD-10-CM

## 2018-02-22 DIAGNOSIS — F419 Anxiety disorder, unspecified: Secondary | ICD-10-CM | POA: Diagnosis not present

## 2018-02-22 DIAGNOSIS — M1990 Unspecified osteoarthritis, unspecified site: Secondary | ICD-10-CM | POA: Diagnosis not present

## 2018-02-22 DIAGNOSIS — D509 Iron deficiency anemia, unspecified: Secondary | ICD-10-CM | POA: Diagnosis not present

## 2018-02-22 DIAGNOSIS — Q438 Other specified congenital malformations of intestine: Secondary | ICD-10-CM | POA: Diagnosis not present

## 2018-02-22 DIAGNOSIS — M419 Scoliosis, unspecified: Secondary | ICD-10-CM | POA: Diagnosis not present

## 2018-02-22 DIAGNOSIS — M810 Age-related osteoporosis without current pathological fracture: Secondary | ICD-10-CM | POA: Diagnosis not present

## 2018-02-22 HISTORY — PX: BIOPSY: SHX5522

## 2018-02-22 HISTORY — PX: ESOPHAGOGASTRODUODENOSCOPY: SHX5428

## 2018-02-22 LAB — HEMOGLOBIN AND HEMATOCRIT, BLOOD
HEMATOCRIT: 35.4 % — AB (ref 36.0–46.0)
Hemoglobin: 11.1 g/dL — ABNORMAL LOW (ref 12.0–15.0)

## 2018-02-22 LAB — BPAM RBC
BLOOD PRODUCT EXPIRATION DATE: 201910172359
Blood Product Expiration Date: 201910172359
ISSUE DATE / TIME: 201909191633
ISSUE DATE / TIME: 201909192245
UNIT TYPE AND RH: 5100
UNIT TYPE AND RH: 5100

## 2018-02-22 LAB — TSH: TSH: 2.653 u[IU]/mL (ref 0.350–4.500)

## 2018-02-22 LAB — CBC
HCT: 28.3 % — ABNORMAL LOW (ref 36.0–46.0)
HEMOGLOBIN: 9 g/dL — AB (ref 12.0–15.0)
MCH: 26.5 pg (ref 26.0–34.0)
MCHC: 31.8 g/dL (ref 30.0–36.0)
MCV: 83.5 fL (ref 78.0–100.0)
PLATELETS: 311 10*3/uL (ref 150–400)
RBC: 3.39 MIL/uL — AB (ref 3.87–5.11)
RDW: 14.3 % (ref 11.5–15.5)
WBC: 7 10*3/uL (ref 4.0–10.5)

## 2018-02-22 LAB — RETICULOCYTES
RBC.: 3.39 MIL/uL — AB (ref 3.87–5.11)
RETIC CT PCT: 1.6 % (ref 0.4–3.1)
Retic Count, Absolute: 54.2 10*3/uL (ref 19.0–186.0)

## 2018-02-22 LAB — TYPE AND SCREEN
ABO/RH(D): O POS
Antibody Screen: NEGATIVE
UNIT DIVISION: 0
Unit division: 0

## 2018-02-22 SURGERY — EGD (ESOPHAGOGASTRODUODENOSCOPY)
Anesthesia: Monitor Anesthesia Care

## 2018-02-22 MED ORDER — PEG-KCL-NACL-NASULF-NA ASC-C 100 G PO SOLR
1.0000 | Freq: Once | ORAL | Status: AC
  Administered 2018-02-22: 200 g via ORAL
  Filled 2018-02-22: qty 1

## 2018-02-22 MED ORDER — ONDANSETRON HCL 4 MG/2ML IJ SOLN
4.0000 mg | Freq: Four times a day (QID) | INTRAMUSCULAR | Status: DC | PRN
  Administered 2018-02-22: 4 mg via INTRAVENOUS
  Filled 2018-02-22: qty 2

## 2018-02-22 MED ORDER — SODIUM CHLORIDE 0.9 % IV SOLN
510.0000 mg | Freq: Once | INTRAVENOUS | Status: AC
  Administered 2018-02-22: 510 mg via INTRAVENOUS
  Filled 2018-02-22: qty 17

## 2018-02-22 MED ORDER — METOCLOPRAMIDE HCL 5 MG/ML IJ SOLN
10.0000 mg | Freq: Once | INTRAMUSCULAR | Status: AC
  Administered 2018-02-23: 10 mg via INTRAVENOUS
  Filled 2018-02-22: qty 2

## 2018-02-22 MED ORDER — BISACODYL 5 MG PO TBEC
10.0000 mg | DELAYED_RELEASE_TABLET | Freq: Once | ORAL | Status: AC
  Administered 2018-02-22: 10 mg via ORAL
  Filled 2018-02-22: qty 2

## 2018-02-22 MED ORDER — BUTAMBEN-TETRACAINE-BENZOCAINE 2-2-14 % EX AERO
INHALATION_SPRAY | CUTANEOUS | Status: DC | PRN
  Administered 2018-02-22: 2 via TOPICAL

## 2018-02-22 MED ORDER — LACTATED RINGERS IV SOLN
INTRAVENOUS | Status: DC
  Administered 2018-02-22: 11:00:00 via INTRAVENOUS

## 2018-02-22 MED ORDER — FERROUS SULFATE 325 (65 FE) MG PO TABS: 325.0000 mg | ORAL_TABLET | Freq: Three times a day (TID) | ORAL | Status: DC

## 2018-02-22 MED ORDER — PROPOFOL 10 MG/ML IV BOLUS
INTRAVENOUS | Status: DC | PRN
  Administered 2018-02-22: 20 mg via INTRAVENOUS

## 2018-02-22 MED ORDER — PROPOFOL 500 MG/50ML IV EMUL
INTRAVENOUS | Status: DC | PRN
  Administered 2018-02-22: 75 ug/kg/min via INTRAVENOUS

## 2018-02-22 MED ORDER — METOCLOPRAMIDE HCL 5 MG/ML IJ SOLN
10.0000 mg | Freq: Once | INTRAMUSCULAR | Status: AC
  Administered 2018-02-22: 10 mg via INTRAVENOUS
  Filled 2018-02-22: qty 2

## 2018-02-22 NOTE — Anesthesia Preprocedure Evaluation (Addendum)
Anesthesia Evaluation  Patient identified by MRN, date of birth, ID band Patient awake    Reviewed: Allergy & Precautions, NPO status , Patient's Chart, lab work & pertinent test results  History of Anesthesia Complications Negative for: history of anesthetic complications  Airway Mallampati: II  TM Distance: >3 FB Neck ROM: Full    Dental  (+) Partial Upper, Dental Advisory Given   Pulmonary neg pulmonary ROS,    Pulmonary exam normal        Cardiovascular negative cardio ROS Normal cardiovascular exam Rhythm:Regular Rate:Normal     Neuro/Psych negative neurological ROS  negative psych ROS   GI/Hepatic Neg liver ROS, hiatal hernia, GERD  ,  Endo/Other  negative endocrine ROS  Renal/GU negative Renal ROS  negative genitourinary   Musculoskeletal  (+) Arthritis ,   Abdominal   Peds  Hematology  (+) anemia ,   Anesthesia Other Findings   Reproductive/Obstetrics                            Anesthesia Physical Anesthesia Plan  ASA: II  Anesthesia Plan: MAC   Post-op Pain Management:    Induction:   PONV Risk Score and Plan: 2 and Propofol infusion  Airway Management Planned: Natural Airway and Nasal Cannula  Additional Equipment:   Intra-op Plan:   Post-operative Plan:   Informed Consent: I have reviewed the patients History and Physical, chart, labs and discussed the procedure including the risks, benefits and alternatives for the proposed anesthesia with the patient or authorized representative who has indicated his/her understanding and acceptance.     Plan Discussed with:   Anesthesia Plan Comments:         Anesthesia Quick Evaluation

## 2018-02-22 NOTE — Progress Notes (Signed)
RN walked in to find patient about to take her own supply of cough syrup. Bottle appeared to be Robitussin. Rn informed patient that taking her own medication was against hospital policy. Patient said she was going to take it anyway's and to just pretend I didn't see anything. After talking with patient further, discovered she had drank half a bottle of water throughout the night despite knowing she was to not have anything to eat of drink. Nurse reminded and encouraged patient to not eat of drink anything and that her procedure is at 11 am and they would come get her by 10am. Endoscopy notified of medication and water taken.

## 2018-02-22 NOTE — Evaluation (Signed)
Physical Therapy Evaluation Patient Details Name: Jennifer Kirk MRN: [id number] DOB: 1935-11-12 Today's Date: 02/22/2018   History of Present Illness  Pt is a 82 y.o. female with PMH of Anxiety, Osteoporosis, Scoliosis, and back pains.  She was admitted from her PCP office due to anemia (hgb 5.9).     Clinical Impression  Pt admitted with above diagnosis. Pt currently with functional limitations due to the deficits listed below (see PT Problem List). PTA pt lived alone, independent and active. On eval, pt required supervision transfers and min guard assist ambulation 150 feet without AD. Pt will benefit from skilled PT to increase their independence and safety with mobility to allow discharge to the venue listed below.       Follow Up Recommendations Outpatient PT;Supervision - Intermittent    Equipment Recommendations  None recommended by PT    Recommendations for Other Services       Precautions / Restrictions Precautions Precautions: None      Mobility  Bed Mobility Overal bed mobility: Modified Independent             General bed mobility comments: +rail, increased time  Transfers Overall transfer level: Needs assistance Equipment used: None Transfers: Sit to/from Stand;Stand Pivot Transfers Sit to Stand: Supervision Stand pivot transfers: Supervision       General transfer comment: supervision for safety, no physical assist  Ambulation/Gait Ambulation/Gait assistance: Min guard Gait Distance (Feet): 150 Feet Assistive device: None Gait Pattern/deviations: Step-through pattern;Decreased stride length Gait velocity: decreased Gait velocity interpretation: 1.31 - 2.62 ft/sec, indicative of limited community ambulator General Gait Details: slow, steady gait  Stairs            Wheelchair Mobility    Modified Rankin (Stroke Patients Only)       Balance Overall balance assessment: Mild deficits observed, not formally tested                                            Pertinent Vitals/Pain Pain Assessment: No/denies pain    Home Living Family/patient expects to be discharged to:: Private residence Living Arrangements: Alone Available Help at Discharge: Family;Available PRN/intermittently Type of Home: House Home Access: Stairs to enter Entrance Stairs-Rails: Psychiatric nurse of Steps: 3 Home Layout: One level Home Equipment: None      Prior Function Level of Independence: Independent         Comments: Active. Drives. Active with OPPT due to scoliosis.     Hand Dominance        Extremity/Trunk Assessment   Upper Extremity Assessment Upper Extremity Assessment: Overall WFL for tasks assessed    Lower Extremity Assessment Lower Extremity Assessment: Generalized weakness    Cervical / Trunk Assessment Cervical / Trunk Assessment: Other exceptions Cervical / Trunk Exceptions: scoliosis  Communication   Communication: No difficulties  Cognition Arousal/Alertness: Awake/alert Behavior During Therapy: WFL for tasks assessed/performed Overall Cognitive Status: Within Functional Limits for tasks assessed                                        General Comments      Exercises     Assessment/Plan    PT Assessment Patient needs continued PT services  PT Problem List Decreased strength;Decreased mobility;Decreased activity tolerance;Decreased balance  PT Treatment Interventions Gait training;Therapeutic activities;Therapeutic exercise;Patient/family education;Stair training;Balance training;Functional mobility training    PT Goals (Current goals can be found in the Care Plan section)  Acute Rehab PT Goals Patient Stated Goal: home PT Goal Formulation: With patient Time For Goal Achievement: 03/08/18 Potential to Achieve Goals: Good    Frequency Min 3X/week   Barriers to discharge        Co-evaluation               AM-PAC PT "6  Clicks" Daily Activity  Outcome Measure Difficulty turning over in bed (including adjusting bedclothes, sheets and blankets)?: None Difficulty moving from lying on back to sitting on the side of the bed? : A Little Difficulty sitting down on and standing up from a chair with arms (e.g., wheelchair, bedside commode, etc,.)?: None Help needed moving to and from a bed to chair (including a wheelchair)?: None Help needed walking in hospital room?: A Little Help needed climbing 3-5 steps with a railing? : A Little 6 Click Score: 21    End of Session Equipment Utilized During Treatment: Gait belt Activity Tolerance: Patient tolerated treatment well Patient left: in bed;with call bell/phone within reach;with family/visitor present Nurse Communication: Mobility status PT Visit Diagnosis: Difficulty in walking, not elsewhere classified (R26.2);Muscle weakness (generalized) (M62.81)    Time: 0355-9741 PT Time Calculation (min) (ACUTE ONLY): 14 min   Charges:   PT Evaluation $PT Eval Low Complexity: 1 Low          Lorrin Goodell, PT  Office # [phone] Pager [phone]   Lorriane Shire 02/22/2018, 9:37 AM

## 2018-02-22 NOTE — Care Management Note (Addendum)
Case Management Note  Patient Details  Name: GENOLA YUILLE MRN: [id number] Date of Birth: May 04, 1936  Subjective/Objective:              Presents with symptomatic anemia. From home alone . PTA received outpatient PT from [facility].      Georgiana Shore (Son) Timmothy Sours And Hollace Kinnier [phone]      Action/Plan: Transition to home when medically ready with the resumption of outpatient PT rehab. @ the [facility]....Marland KitchenNCM will continue to monitor for transitional care needs. NCM to fax [phone]) PT evaluation and d/c summary to rehab. Center. Expected Discharge Date:                  Expected Discharge Plan:  Home/Self Care  In-House Referral:     Discharge planning Services  CM Consult  Post Acute Care Choice:    Choice offered to:     DME Arranged:   n/a DME Agency:   n/a  HH Arranged:   n/a HH Agency:   n/a  Status of Service:  Completed, signed off  If discussed at [place] of Stay Meetings, dates discussed:    Additional Comments:  Sharin Mons, RN 02/22/2018, 3:14 PM

## 2018-02-22 NOTE — Transfer of Care (Signed)
Immediate Anesthesia Transfer of Care Note  Patient: Jennifer Kirk  Procedure(s) Performed: ESOPHAGOGASTRODUODENOSCOPY (EGD) (N/A ) BIOPSY  Patient Location: Endoscopy Unit  Anesthesia Type:MAC  Level of Consciousness: awake, oriented and patient cooperative  Airway & Oxygen Therapy: Patient Spontanous Breathing and Patient connected to nasal cannula oxygen  Post-op Assessment: Report given to RN and Post -op Vital signs reviewed and stable  Post vital signs: Reviewed  Last Vitals:  Vitals Value Taken Time  BP    Temp    Pulse 82 02/22/2018 11:49 AM  Resp 17 02/22/2018 11:49 AM  SpO2 99 % 02/22/2018 11:49 AM  Vitals shown include unvalidated device data.  Last Pain:  Vitals:   02/22/18 1042  TempSrc: Oral  PainSc:          Complications: No apparent anesthesia complications

## 2018-02-22 NOTE — Progress Notes (Signed)
@IPLOG @        PROGRESS NOTE                                                                                                                                                                                                             Patient Demographics:    Jennifer Kirk, is a 82 y.o. female, DOB - [birth year], WUJ:[id number]  Admit date - 02/21/2018   Admitting Physician Kinnie Feil, MD  Outpatient Primary MD for the patient is [name], MD  LOS - 0  Chief Complaint  Patient presents with  . Coagulation Disorder       Brief Narrative   Jennifer Kirk is a 82 y.o. female with PMH of Anxiety, Osteoporosis, Scoliosis, back pains, presented from abnormal labs from PCP office. Patient states that she has been progressively short of breath, fatigued, tired, episodes of dizziness for few month. She went to see her primary care doctor. She was referred to emergency room since her lab work showed Hg at 6.9, she had no preceding history of hematemesis, melena or frank blood in stool.   Subjective:    Jennifer Kirk today has, No headache, No chest pain, No abdominal pain - No Nausea, No new weakness tingling or numbness, No Cough - SOB.    Assessment  & Plan :     1.  Severe symptomatic iron deficiency anemia.  Status post 2 units of packed RBC transfusion on 02/21/2018 with appropriate rise in H&H, no signs of ongoing bleeding, continue PPI, EGD unremarkable, due for colonoscopy tomorrow, will transfuse IV iron as well today.  Overall feels a whole lot better.  Increase activity, PT eval.  2.  History of scoliosis, chronic back pain and heavy NSAID use.  Counseled to quit NSAID use.  No acute issues.  3.  Hyperetension.  Placed on low-dose Norvasc and monitor.    Family Communication  :  Son  Code Status :  Full  Disposition Plan  :  Home post colonoscopy  Consults  :  GI  Procedures  :    EGD - Tortious distal Esophageus, Hiatal Hernia - pending biopsy  results.  Colonoscopy -   DVT Prophylaxis  :    SCDs    Lab Results  Component Value Date   PLT 311 02/22/2018    Diet :  Diet Order            Diet NPO time specified Except for: Sips with Meds  Diet effective midnight  Diet clear liquid Room service appropriate? Yes; Fluid consistency: Thin  Diet effective now               Inpatient Medications Scheduled Meds: . ALPRAZolam  0.5 mg Oral QHS  . bisacodyl  10 mg Oral Once   Followed by  . bisacodyl  10 mg Oral Once  . metoCLOPramide (REGLAN) injection  10 mg Intravenous Once   Followed by  . [START ON 02/23/2018] metoCLOPramide (REGLAN) injection  10 mg Intravenous Once  . pantoprazole  40 mg Oral BID AC  . peg 3350 powder  1 kit Oral Once   Continuous Infusions: PRN Meds:.  Antibiotics  :   Anti-infectives (From admission, onward)   None          Objective:   Vitals:   02/22/18 1042 02/22/18 1150 02/22/18 1200 02/22/18 1210  BP: 135/74 140/64 (!) 141/89 (!) 145/64  Pulse: 77 80 78 80  Resp: 14 16 12 19   Temp: 98 F (36.7 C) 97.9 F (36.6 C)    TempSrc: Oral Oral    SpO2: 97% 99% 96% 98%  Weight:      Height:        Wt Readings from Last 3 Encounters:  02/22/18 47.2 kg  10/23/16 45.4 kg  07/15/15 49.9 kg     Intake/Output Summary (Last 24 hours) at 02/22/2018 1228 Last data filed at 02/22/2018 1149 Gross per 24 hour  Intake 771.68 ml  Output 2 ml  Net 769.68 ml     Physical Exam  Awake Alert, Oriented X 3, No new F.N deficits, Normal affect [place].AT,PERRAL Supple Neck,No JVD, No cervical lymphadenopathy appriciated.  Symmetrical Chest wall movement, Good air movement bilaterally, CTAB RRR,No Gallops,Rubs or new Murmurs, No Parasternal Heave +ve B.Sounds, Abd Soft, No tenderness, No organomegaly appriciated, No rebound - guarding or rigidity. No Cyanosis, Clubbing or edema, No new Rash or bruise       Data Review:    CBC Recent Labs  Lab 02/21/18 1450 02/22/18 0438  WBC  8.6 7.0  HGB 5.9* 9.0*  HCT 19.7* 28.3*  PLT 402* 311  MCV 83.5 83.5  MCH 25.0* 26.5  MCHC 29.9* 31.8  RDW 13.6 14.3  LYMPHSABS 0.7  --   MONOABS 0.8  --   EOSABS 0.1  --   BASOSABS 0.1  --     Chemistries  Recent Labs  Lab 02/21/18 1450  NA 134*  K 3.9  CL 101  CO2 24  GLUCOSE 119*  BUN 18  CREATININE 1.13*  CALCIUM 8.3*  AST 21  ALT 15  ALKPHOS 48  BILITOT 0.5   ------------------------------------------------------------------------------------------------------------------ No results for input(s): CHOL, HDL, LDLCALC, TRIG, CHOLHDL, LDLDIRECT in the last 72 hours.  No results found for: HGBA1C ------------------------------------------------------------------------------------------------------------------ No results for input(s): TSH, T4TOTAL, T3FREE, THYROIDAB in the last 72 hours.  Invalid input(s): FREET3 ------------------------------------------------------------------------------------------------------------------ Recent Labs    02/21/18 1546 02/22/18 0438  FERRITIN 5*  --   TIBC 409  --   IRON 13*  --   RETICCTPCT  --  1.6    Coagulation profile No results for input(s): INR, PROTIME in the last 168 hours.  No results for input(s): DDIMER in the last 72 hours.  Cardiac Enzymes No results for input(s): CKMB, TROPONINI, MYOGLOBIN in the last 168 hours.  Invalid input(s): CK ------------------------------------------------------------------------------------------------------------------ No results found for: BNP  Micro Results No results found for this or any previous visit (from the past 240 hour(s)).  Radiology Reports No  results found.  Time Spent in minutes  30   Lala Lund M.D on 02/22/2018 at 12:28 PM  To page go to [email or web address] - password [email or web address]

## 2018-02-22 NOTE — Anesthesia Procedure Notes (Signed)
Procedure Name: MAC Date/Time: 02/22/2018 11:24 AM Performed by: Jenne Campus, CRNA Pre-anesthesia Checklist: Patient identified, Emergency Drugs available, Suction available and Patient being monitored Oxygen Delivery Method: Nasal cannula

## 2018-02-22 NOTE — Op Note (Addendum)
[name] Patient Name: Jennifer Kirk Procedure Date : 02/22/2018 MRN: [id number] Attending MD: Thornton Park MD, MD Date of Birth: [birth year] CSN: [id number] Age: 82 Admit Type: Inpatient Procedure:                Upper GI endoscopy Indications:              Unexplained iron deficiency anemia. Symptomatic                            iron deficiency anemia presenting with a hemoglobin                            of 5.9. No overt or occult GI blood loss. On                            melaxicam daily since 06/2017 for back pain. Providers:                Thornton Park MD, MD, Carlyn Reichert, RN, Elspeth Cho Tech., Technician, Luciana Axe, CRNA Referring MD:              Medicines:                Monitored Anesthesia Care Complications:            No immediate complications. Estimated Blood Loss:     Estimated blood loss was minimal. Procedure:                Pre-Anesthesia Assessment:                           - Prior to the procedure, a History and Physical                            was performed, and patient medications and                            allergies were reviewed. The patient's tolerance of                            previous anesthesia was also reviewed. The risks                            and benefits of the procedure and the sedation                            options and risks were discussed with the patient.                            All questions were answered, and informed consent                            was obtained. Prior Anticoagulants: The patient has  taken no previous anticoagulant or antiplatelet                            agents. ASA Grade Assessment: III - A patient with                            severe systemic disease. After reviewing the risks                            and benefits, the patient was deemed in                            satisfactory condition to undergo  the procedure.                           After obtaining informed consent, the endoscope was                            passed under direct vision. Throughout the                            procedure, the patient's blood pressure, pulse, and                            oxygen saturations were monitored continuously. The                            GIF-H190 ([id number] Olympus Adult EGD was introduced                            through the mouth, and advanced to the third part                            of duodenum. The upper GI endoscopy was                            accomplished without difficulty. The patient                            tolerated the procedure well. Scope In: Scope Out: Findings:      The distal esophagus was significantly tortuous. No mucosal       abnormalities present.      A 6 cm hernia was found. The proximal extent of the gastric folds (end       of tubular esophagus) was 40 cm from the incisors. The hiatal narrowing       was 34 cm from the incisors. No Cameron's lesions seen.      The stomach was normal. No blood present. There is some mild erythema       with all mucosal contact with the scope.      The examined duodenum was normal. No blood present. Biopsies were taken       with a cold forceps for histology. Verification of patient       identification for the specimen was done by the physician using  the       patient's name and birth date. Impression:               - Tortuous distal esophagus.                           - 6 cm paraesophageal hernia without Cameron's                            lesions.                           - Normal stomach. No blood present.                           - Normal examined duodenum. Biopsied to exclude                            celiac.                           - Source of anemia not identified on this                            examination. Recommendation:           - Await pathology results.                           -  Perform a colonoscopy tomorrow. Bowel prep later                            today.                           - Clear liquid diet today. NPO at midnight.                           - Continue present medications except avoid NSAIDs                            as able. Procedure Code(s):        --- Professional ---                           [phone], Esophagogastroduodenoscopy, flexible,                            transoral; with biopsy, single or multiple Diagnosis Code(s):        --- Professional ---                           Q39.9, Congenital malformation of esophagus,                            unspecified                           K44.9, Diaphragmatic hernia without obstruction or  gangrene                           D50.9, Iron deficiency anemia, unspecified CPT copyright 2017 [employer] Medical Association. All rights reserved. The codes documented in this report are preliminary and upon coder review may  be revised to meet current compliance requirements. Thornton Park MD, MD 02/22/2018 12:10:30 PM This report has been signed electronically. Number of Addenda: 1 Addendum Number: 1   Addendum Date: 02/22/2018 12:17:56 PM      Results and recommendations reviewed with the patient and her son in the       endoscopy unit. All questions answered to their satisfaction. Thornton Park MD, MD 02/22/2018 12:18:30 PM This report has been signed electronically.

## 2018-02-22 NOTE — Anesthesia Preprocedure Evaluation (Addendum)
Anesthesia Evaluation  Patient identified by MRN, date of birth, ID band Patient awake    Reviewed: Allergy & Precautions, NPO status , Patient's Chart, lab work & pertinent test results  History of Anesthesia Complications Negative for: history of anesthetic complications  Airway Mallampati: III  TM Distance: >3 FB Neck ROM: Full    Dental  (+) Dental Advisory Given, Partial Upper   Pulmonary neg pulmonary ROS,    breath sounds clear to auscultation       Cardiovascular negative cardio ROS   Rhythm:Regular Rate:Tachycardia     Neuro/Psych Anxiety negative neurological ROS     GI/Hepatic Neg liver ROS, hiatal hernia, GERD  Controlled,  Endo/Other  negative endocrine ROS  Renal/GU negative Renal ROS  negative genitourinary   Musculoskeletal  (+) Arthritis ,  Scoliosis    Abdominal   Peds  Hematology  (+) anemia ,   Anesthesia Other Findings   Reproductive/Obstetrics                            Anesthesia Physical Anesthesia Plan  ASA: II  Anesthesia Plan: MAC   Post-op Pain Management:    Induction: Intravenous  PONV Risk Score and Plan: 2 and Propofol infusion and Treatment may vary due to age or medical condition  Airway Management Planned: Nasal Cannula and Natural Airway  Additional Equipment: None  Intra-op Plan:   Post-operative Plan:   Informed Consent: I have reviewed the patients History and Physical, chart, labs and discussed the procedure including the risks, benefits and alternatives for the proposed anesthesia with the patient or authorized representative who has indicated his/her understanding and acceptance.     Plan Discussed with: CRNA and Anesthesiologist  Anesthesia Plan Comments:        Anesthesia Quick Evaluation

## 2018-02-23 ENCOUNTER — Observation Stay (HOSPITAL_COMMUNITY): Payer: Medicare Other | Admitting: Anesthesiology

## 2018-02-23 ENCOUNTER — Encounter (HOSPITAL_COMMUNITY)
Admission: EM | Disposition: A | Discharge status: Home / Self Care | Payer: Self-pay | Source: Home / Self Care | Attending: Emergency Medicine

## 2018-02-23 ENCOUNTER — Encounter (HOSPITAL_COMMUNITY): Payer: Self-pay | Admitting: *Deleted

## 2018-02-23 DIAGNOSIS — K224 Dyskinesia of esophagus: Secondary | ICD-10-CM | POA: Diagnosis not present

## 2018-02-23 DIAGNOSIS — K219 Gastro-esophageal reflux disease without esophagitis: Secondary | ICD-10-CM | POA: Diagnosis not present

## 2018-02-23 DIAGNOSIS — D500 Iron deficiency anemia secondary to blood loss (chronic): Secondary | ICD-10-CM

## 2018-02-23 DIAGNOSIS — D649 Anemia, unspecified: Secondary | ICD-10-CM | POA: Diagnosis not present

## 2018-02-23 DIAGNOSIS — K5730 Diverticulosis of large intestine without perforation or abscess without bleeding: Secondary | ICD-10-CM

## 2018-02-23 DIAGNOSIS — R030 Elevated blood-pressure reading, without diagnosis of hypertension: Secondary | ICD-10-CM | POA: Diagnosis not present

## 2018-02-23 DIAGNOSIS — F419 Anxiety disorder, unspecified: Secondary | ICD-10-CM | POA: Diagnosis not present

## 2018-02-23 DIAGNOSIS — M419 Scoliosis, unspecified: Secondary | ICD-10-CM | POA: Diagnosis not present

## 2018-02-23 DIAGNOSIS — R5382 Chronic fatigue, unspecified: Secondary | ICD-10-CM | POA: Diagnosis not present

## 2018-02-23 DIAGNOSIS — M1990 Unspecified osteoarthritis, unspecified site: Secondary | ICD-10-CM | POA: Diagnosis not present

## 2018-02-23 DIAGNOSIS — K449 Diaphragmatic hernia without obstruction or gangrene: Secondary | ICD-10-CM

## 2018-02-23 DIAGNOSIS — M810 Age-related osteoporosis without current pathological fracture: Secondary | ICD-10-CM | POA: Diagnosis not present

## 2018-02-23 DIAGNOSIS — Q438 Other specified congenital malformations of intestine: Secondary | ICD-10-CM | POA: Diagnosis not present

## 2018-02-23 DIAGNOSIS — I10 Essential (primary) hypertension: Secondary | ICD-10-CM | POA: Diagnosis not present

## 2018-02-23 DIAGNOSIS — D509 Iron deficiency anemia, unspecified: Secondary | ICD-10-CM

## 2018-02-23 HISTORY — PX: COLONOSCOPY WITH PROPOFOL: SHX5780

## 2018-02-23 LAB — BASIC METABOLIC PANEL
ANION GAP: 11 (ref 5–15)
BUN: 7 mg/dL — ABNORMAL LOW (ref 8–23)
CHLORIDE: 111 mmol/L (ref 98–111)
CO2: 19 mmol/L — ABNORMAL LOW (ref 22–32)
Calcium: 8.3 mg/dL — ABNORMAL LOW (ref 8.9–10.3)
Creatinine, Ser: 0.78 mg/dL (ref 0.44–1.00)
GFR calc Af Amer: >60 mL/min (ref >60)
GLUCOSE: 101 mg/dL — AB (ref 70–99)
POTASSIUM: 3.8 mmol/L (ref 3.5–5.1)
Sodium: 141 mmol/L (ref 135–145)

## 2018-02-23 LAB — CBC
HCT: 33.3 % — ABNORMAL LOW (ref 36.0–46.0)
HEMOGLOBIN: 10.5 g/dL — AB (ref 12.0–15.0)
MCH: 26.6 pg (ref 26.0–34.0)
MCHC: 31.5 g/dL (ref 30.0–36.0)
MCV: 84.3 fL (ref 78.0–100.0)
PLATELETS: 364 10*3/uL (ref 150–400)
RBC: 3.95 MIL/uL (ref 3.87–5.11)
RDW: 14.6 % (ref 11.5–15.5)
WBC: 6.1 10*3/uL (ref 4.0–10.5)

## 2018-02-23 LAB — MAGNESIUM: MAGNESIUM: 1.9 mg/dL (ref 1.7–2.4)

## 2018-02-23 SURGERY — COLONOSCOPY WITH PROPOFOL
Anesthesia: Monitor Anesthesia Care

## 2018-02-23 MED ORDER — PROPOFOL 500 MG/50ML IV EMUL
INTRAVENOUS | Status: DC | PRN
  Administered 2018-02-23: 25 ug/kg/min via INTRAVENOUS

## 2018-02-23 MED ORDER — LACTATED RINGERS IV SOLN
INTRAVENOUS | Status: DC | PRN
  Administered 2018-02-23 (×2): via INTRAVENOUS

## 2018-02-23 MED ORDER — PANTOPRAZOLE SODIUM 40 MG PO TBEC: 40.0000 mg | DELAYED_RELEASE_TABLET | Freq: Every day | ORAL | 0 refills | Status: DC

## 2018-02-23 MED ORDER — FERROUS SULFATE 325 (65 FE) MG PO TABS: 325.0000 mg | ORAL_TABLET | Freq: Two times a day (BID) | ORAL | 0 refills | Status: DC

## 2018-02-23 MED ORDER — PROPOFOL 500 MG/50ML IV EMUL
INTRAVENOUS | Status: DC | PRN
  Administered 2018-02-23: 200 ug via INTRAVENOUS

## 2018-02-23 MED ORDER — LIDOCAINE 2% (20 MG/ML) 5 ML SYRINGE
INTRAMUSCULAR | Status: DC | PRN
  Administered 2018-02-23: 60 mg via INTRAVENOUS

## 2018-02-23 MED ORDER — HYDRALAZINE HCL 20 MG/ML IJ SOLN
10.0000 mg | Freq: Four times a day (QID) | INTRAMUSCULAR | Status: DC | PRN
  Administered 2018-02-23: 10 mg via INTRAVENOUS
  Filled 2018-02-23: qty 1

## 2018-02-23 MED ORDER — PHENYLEPHRINE 40 MCG/ML (10ML) SYRINGE FOR IV PUSH (FOR BLOOD PRESSURE SUPPORT)
PREFILLED_SYRINGE | INTRAVENOUS | Status: DC | PRN
  Administered 2018-02-23: 40 ug via INTRAVENOUS

## 2018-02-23 MED ORDER — METOPROLOL TARTRATE 25 MG PO TABS: 25.0000 mg | ORAL_TABLET | Freq: Two times a day (BID) | ORAL | 11 refills | Status: DC

## 2018-02-23 SURGICAL SUPPLY — 22 items

## 2018-02-23 NOTE — Discharge Summary (Signed)
Jennifer Kirk LKT:[id number] DOB: 03/03/1936 DOA: 02/21/2018  PCP: [name], MD  Admit date: 02/21/2018  Discharge date: 02/23/2018  Admitted From: Home   Disposition:  Home   Recommendations for Outpatient Follow-up:   Follow up with PCP in 1-2 weeks  PCP Please obtain BMP/CBC, 2 view CXR in 1week,  (see Discharge instructions)   PCP Please follow up on the following pending results:    Home Health: [facility]   Equipment/Devices: None  Consultations: GI Discharge Condition: Stable   CODE STATUS: Full   Diet Recommendation: Heart Healthy   Diet Order            Diet regular Room service appropriate? Yes; Fluid consistency: Thin  Diet effective now        Diet - low sodium heart healthy               Chief Complaint  Patient presents with  . Coagulation Disorder     Brief history of present illness from the day of admission and additional interim summary    [place] a 82 y.o.femalewith PMH of Anxiety, Osteoporosis, Scoliosis, back pains, presented from abnormal labs from PCP office. Patient states that she has been progressively short of breath, fatigued, tired, episodes of dizziness for few month. She went to see her primary care doctor. She was referred to emergency room since her lab work showed Hg at 6.9, she had no preceding history of hematemesis, melena or frank blood in stool.                                                                 Hospital Course    1.  Severe symptomatic iron deficiency anemia.  Status post 2 units of packed RBC transfusion on 02/21/2018 with appropriate rise in H&H, no signs of ongoing bleeding, continue PPI, EGD and colonoscopy unremarkable, she was also given IV iron transfusion prior to discharge and will be placed on oral PPI and oral iron  supplementation at the time of discharge.  She is symptom-free and close to her baseline eager to go home, will get home PT and RN and will be discharged home with PCP and GI follow-up.  Request PCP to monitor her hemoglobin and anemia panel closely post discharge.  2.  History of scoliosis, chronic back pain and heavy NSAID use.  Counseled to quit NSAID use.  No acute issues.  3.  Hyperetension.  Placed on low-dose Lopressor, PCP to monitor and adjust.  Had stable TSH.Jennifer Kirk   Discharge diagnosis     Active Problems:   Symptomatic anemia   Fatigue   Hiatal hernia   Iron deficiency anemia    Discharge instructions    Discharge Instructions    Diet - low sodium heart healthy   Complete by:  As directed    Discharge instructions   Complete by:  As directed    Follow with Primary MD [name], MD in 7 days   Get CBC, CMP, 2 view Chest X ray checked  by Primary MD or SNF MD in 5-7 days   Activity: As tolerated with Full fall precautions use walker/cane & assistance as needed  Disposition Home    Diet: Heart Healthy     For Heart failure patients - Check your Weight same time everyday, if you gain over 2 pounds, or you develop in leg swelling, experience more shortness of breath or chest pain, call your Primary MD immediately. Follow Cardiac Low Salt Diet and 1.5 lit/day fluid restriction.  Special Instructions: If you have smoked or chewed Tobacco  in the last 2 yrs please stop smoking, stop any regular Alcohol  and or any Recreational drug use.  On your next visit with your primary care physician please Get Medicines reviewed and adjusted.  Please request your Prim.MD to go over all Hospital Tests and Procedure/Radiological results at the follow up, please get all Hospital records sent to your Prim MD by signing hospital release before you go home.  If you experience worsening of your admission symptoms, develop shortness of breath, life threatening emergency, suicidal or  homicidal thoughts you must seek medical attention immediately by calling 911 or calling your MD immediately  if symptoms less severe.  You Must read complete instructions/literature along with all the possible adverse reactions/side effects for all the Medicines you take and that have been prescribed to you. Take any new Medicines after you have completely understood and accpet all the possible adverse reactions/side effects.   Increase activity slowly   Complete by:  As directed       Discharge Medications   Allergies as of 02/23/2018      Reactions   Codeine Nausea And Vomiting   Penicillins Rash   Has patient had a PCN reaction causing immediate rash, facial/tongue/throat swelling, SOB or lightheadedness with hypotension: Yes Has patient had a PCN reaction causing severe rash involving mucus membranes or skin necrosis: No Has patient had a PCN reaction that required hospitalization: No Has patient had a PCN reaction occurring within the last 10 years: No If all of the above answers are "NO", then may proceed with Cephalosporin use.      Medication List    STOP taking these medications   GINGER PO   KRILL OIL PO   vitamin C 500 MG tablet Commonly known as:  ASCORBIC ACID     TAKE these medications   acidophilus Caps capsule Take 1 capsule by mouth daily.   ALPRAZolam 0.5 MG tablet Commonly known as:  XANAX Take 0.5 mg by mouth at bedtime.   BIOTIN PO Take 1 capsule by mouth daily.   CALCIUM PO Take 2 tablets by mouth 2 (two) times daily.   cholecalciferol 400 units Tabs tablet Commonly known as:  VITAMIN D Take 400 Units by mouth daily.   denosumab 60 MG/ML Sosy injection Commonly known as:  PROLIA Inject 60 mg into the skin every 6 (six) months.   ferrous sulfate 325 (65 FE) MG tablet Take 1 tablet (325 mg total) by mouth 2 (two) times daily with a meal.   metoprolol tartrate 25 MG tablet Commonly known as:  LOPRESSOR Take 1 tablet (25 mg total) by mouth  2 (two) times daily.   multivitamin with minerals Tabs tablet Take 1 tablet by mouth daily.  pantoprazole 40 MG tablet Commonly known as:  PROTONIX Take 1 tablet (40 mg total) by mouth daily.       Follow-up Information    Stoneking, Hal, MD. Schedule an appointment as soon as possible for a visit in 1 week(s).   Specialty:  Internal Medicine Contact information: [address] [place] [address] [phone]        [name], MD. Schedule an appointment as soon as possible for a visit in 2 week(s).   Specialty:  Gastroenterology Contact information: [address] [place] [address] [phone]           Major procedures and Radiology Reports - PLEASE review detailed and final reports thoroughly  -       EGD - Tortious distal Esophageus, Hiatal Hernia - pending biopsy results.  Colonoscopy - showed diverticulosis.   No results found.  Micro Results     No results found for this or any previous visit (from the past 240 hour(s)).  Today   Subjective    Rubin Payor today has no headache,no chest abdominal pain,no new weakness tingling or numbness, feels much better wants to go home today.     Objective   Blood pressure (!) 118/47, pulse (!) 102, temperature 97.9 F (36.6 C), temperature source Oral, resp. rate 14, height 4\' 10"  (1.473 m), weight 47.2 kg, SpO2 97 %.   Intake/Output Summary (Last 24 hours) at 02/23/2018 0949 Last data filed at 02/23/2018 0803 Gross per 24 hour  Intake 1633.69 ml  Output 2 ml  Net 1631.69 ml    Exam Awake Alert, Oriented x 3, No new F.N deficits, Normal affect [place].AT,PERRAL Supple Neck,No JVD, No cervical lymphadenopathy appriciated.  Symmetrical Chest wall movement, Good air movement bilaterally, CTAB RRR,No Gallops,Rubs or new Murmurs, No Parasternal Heave +ve B.Sounds, Abd Soft, Non tender, No organomegaly appriciated, No rebound -guarding or rigidity. No Cyanosis, Clubbing  or edema, No new Rash or bruise   Data Review   CBC w Diff:  Lab Results  Component Value Date   WBC 7.0 02/22/2018   HGB 11.1 (L) 02/22/2018   HCT 35.4 (L) 02/22/2018   PLT 311 02/22/2018   LYMPHOPCT 9 02/21/2018   MONOPCT 9 02/21/2018   EOSPCT 1 02/21/2018   BASOPCT 1 02/21/2018    CMP:  Lab Results  Component Value Date   NA 134 (L) 02/21/2018   K 3.9 02/21/2018   CL 101 02/21/2018   CO2 24 02/21/2018   BUN 18 02/21/2018   CREATININE 1.13 (H) 02/21/2018   PROT 5.5 (L) 02/21/2018   ALBUMIN 3.0 (L) 02/21/2018   BILITOT 0.5 02/21/2018   ALKPHOS 48 02/21/2018   AST 21 02/21/2018   ALT 15 02/21/2018  .   Total Time in preparing paper work, data evaluation and todays exam - 39 minutes  Lala Lund M.D on 02/23/2018 at 9:49 AM  [facility]   Office  [phone]

## 2018-02-23 NOTE — Progress Notes (Signed)
Patient discharged to home. IV removed and intact. Patient was A&Ox4 and stable. AVS given and reviewed with patient and son.Patient escorted out via wheel chair by RN.

## 2018-02-23 NOTE — Interval H&P Note (Signed)
History and Physical Interval Note:  02/23/2018 7:32 AM  Jennifer Kirk  has presented today for surgery, with the diagnosis of anemia  The various methods of treatment have been discussed with the patient and family. After consideration of risks, benefits and other options for treatment, the patient has consented to  Procedure(s): COLONOSCOPY WITH PROPOFOL (N/A) as a surgical intervention .  The patient's history has been reviewed, patient examined, no change in status, stable for surgery.  I have reviewed the patient's chart and labs.  Questions were answered to the patient's satisfaction.     [name]

## 2018-02-23 NOTE — Discharge Instructions (Signed)
Follow with Primary MD [name], MD in 7 days   Get CBC, CMP, 2 view Chest X ray checked  by Primary MD or SNF MD in 5-7 days   Activity: As tolerated with Full fall precautions use walker/cane & assistance as needed  Disposition Home    Diet: Heart Healthy     For Heart failure patients - Check your Weight same time everyday, if you gain over 2 pounds, or you develop in leg swelling, experience more shortness of breath or chest pain, call your Primary MD immediately. Follow Cardiac Low Salt Diet and 1.5 lit/day fluid restriction.  Special Instructions: If you have smoked or chewed Tobacco  in the last 2 yrs please stop smoking, stop any regular Alcohol  and or any Recreational drug use.  On your next visit with your primary care physician please Get Medicines reviewed and adjusted.  Please request your Prim.MD to go over all Hospital Tests and Procedure/Radiological results at the follow up, please get all Hospital records sent to your Prim MD by signing hospital release before you go home.  If you experience worsening of your admission symptoms, develop shortness of breath, life threatening emergency, suicidal or homicidal thoughts you must seek medical attention immediately by calling 911 or calling your MD immediately  if symptoms less severe.  You Must read complete instructions/literature along with all the possible adverse reactions/side effects for all the Medicines you take and that have been prescribed to you. Take any new Medicines after you have completely understood and accpet all the possible adverse reactions/side effects.

## 2018-02-23 NOTE — Transfer of Care (Signed)
Immediate Anesthesia Transfer of Care Note  Patient: Jennifer Kirk  Procedure(s) Performed: COLONOSCOPY WITH PROPOFOL (N/A )  Patient Location: PACU  Anesthesia Type:MAC  Level of Consciousness: awake, alert , oriented and patient cooperative  Airway & Oxygen Therapy: Patient Spontanous Breathing and Patient connected to nasal cannula oxygen  Post-op Assessment: Report given to RN and Post -op Vital signs reviewed and stable  Post vital signs: Reviewed and stable  Last Vitals:  Vitals Value Taken Time  BP 119/45 02/23/2018  8:07 AM  Temp 36.6 C 02/23/2018  8:07 AM  Pulse 102 02/23/2018  8:07 AM  Resp 18 02/23/2018  8:07 AM  SpO2 97 % 02/23/2018  8:07 AM  Vitals shown include unvalidated device data.  Last Pain:  Vitals:   02/23/18 0807  TempSrc: Oral  PainSc: 0-No pain         Complications: No apparent anesthesia complications

## 2018-02-23 NOTE — Progress Notes (Addendum)
 -   No cause of anemia seen at colonoscopy or on EGD ? nutritional Celiac bxs pending from EGD yesterday - we will notify. I stopped PPI as she was not on at admit and no clear indication - if she does have GERD sxs could restart - NOTE balance of evidence only suggests weak association with fractures and no conclusive proof it causes osteoporosis if that is an issue  She has had 1 feraheme, PCP Dr. Felipa Eth may consider second home on Fe SO4 qd-bid F/U PCP yes F/U GI is not necessary after dc I would not do a small bowel capsule ion her but would Tx w/ FeSO4 It is possible that meloxicam is causing small bowel ulceration but if it is helping quality of life think could continue and do iron supplementation   Home today unless other issues keep her here  [name], MD, [facility] 02/23/2018 8:24 AM Pager [phone]

## 2018-02-23 NOTE — Interval H&P Note (Signed)
History and Physical Interval Note:  02/23/2018 7:31 AM  Jennifer Kirk  has presented today for surgery, with the diagnosis of anemia  The various methods of treatment have been discussed with the patient and family. After consideration of risks, benefits and other options for treatment, the patient has consented to  Procedure(s): COLONOSCOPY WITH PROPOFOL (N/A) as a surgical intervention .  The patient's history has been reviewed, patient examined, no change in status, stable for surgery.  I have reviewed the patient's chart and labs.  Questions were answered to the patient's satisfaction.     [name]

## 2018-02-23 NOTE — Progress Notes (Signed)
NCM faxed pt eval and dc summary to [facility] rehab [phone]. Per notes from previous NCM will need to be faxed at dc.

## 2018-02-23 NOTE — Op Note (Signed)
[facility] Patient Name: Jennifer Kirk Procedure Date : 02/23/2018 MRN: [id number] Attending MD: [name] , MD Date of Birth: 06/17/1935 CSN: [id number] Age: 82 Admit Type: Inpatient Procedure:                Colonoscopy Indications:              Iron deficiency anemia Providers:                [name], MD, Elna Breslow, RN, Nevin Bloodgood, Technician, Mardene Celeste "Trish" Durene Romans, CRNA Referring MD:              Medicines:                Monitored Anesthesia Care Complications:            No immediate complications. Estimated Blood Loss:     Estimated blood loss: none. Procedure:                Pre-Anesthesia Assessment:                           - Prior to the procedure, a History and Physical                            was performed, and patient medications and                            allergies were reviewed. The patient's tolerance of                            previous anesthesia was also reviewed. The risks                            and benefits of the procedure and the sedation                            options and risks were discussed with the patient.                            All questions were answered, and informed consent                            was obtained. Prior Anticoagulants: The patient has                            taken no previous anticoagulant or antiplatelet                            agents. ASA Grade Assessment: II - A patient with                            mild systemic disease. After reviewing the risks  and benefits, the patient was deemed in                            satisfactory condition to undergo the procedure.                           After obtaining informed consent, the colonoscope                            was passed under direct vision. Throughout the                            procedure, the patient's blood pressure, pulse, and       oxygen saturations were monitored continuously. The                            [id number]-[id number] ([id number]) peds colon was introduced                            through the anus and advanced to the the cecum,                            identified by appendiceal orifice and ileocecal                            valve. The colonoscopy was technically difficult                            and complex due to restricted mobility of the                            colon. Successful completion of the procedure was                            aided by withdrawing and reinserting the scope. The                            patient tolerated the procedure well. The quality                            of the bowel preparation was excellent. The bowel                            preparation used was MoviPrep. No anatomical                            landmarks were photographed. Scope In: 7:43:28 AM Scope Out: 8:03:38 AM Scope Withdrawal Time: 0 hours 9 minutes 23 seconds  Total Procedure Duration: 0 hours 20 minutes 10 seconds  Findings:      The perianal and digital rectal examinations were normal.      Multiple diverticula were found in the sigmoid colon. There was       narrowing of the colon in association with the diverticular opening.  The exam was otherwise without abnormality on direct and retroflexion       views. Impression:               - Severe diverticulosis in the sigmoid colon. There                            was narrowing of the colon in association with the                            diverticular opening.                           - The examination was otherwise normal on direct                            and retroflexion views.                           - No specimens collected. Photos not done Recommendation:           - Return patient to hospital ward for possible                            discharge same day.                           - Resume regular diet.                            - No cause of anemia seen here or on EGD ?                            nutritional                           Celiac bxs pending from EGD yesterday - we will                            notify.                           I stopped PPI as she was not on at admit and no                            clear indication                           She has had 1 feraheme, PCP Dr. Felipa Eth may                            consider second                           home on Fe SO4 qd-bid                           F/U PCP  yes                           F/U GI is not necessary after dc                           I would not do a small bowel capsule ion her but                            would Tx w/ FeSO4                           It is possible that meloxicam is causing small                            bowel ulceration but if it is helping quality of                            life think could continue and do iron                            supplementation Procedure Code(s):        --- Professional ---                           [phone], Colonoscopy, flexible; diagnostic, including                            collection of specimen(s) by brushing or washing,                            when performed (separate procedure) Diagnosis Code(s):        --- Professional ---                           D50.9, Iron deficiency anemia, unspecified                           K57.30, Diverticulosis of large intestine without                            perforation or abscess without bleeding CPT copyright 2017 American Medical Association. All rights reserved. The codes documented in this report are preliminary and upon coder review may  be revised to meet current compliance requirements. [name], MD 02/23/2018 8:24:58 AM This report has been signed electronically. Number of Addenda: 0

## 2018-02-24 NOTE — Addendum Note (Signed)
Addendum  created 02/24/18 1039 by Sammie Bench, CRNA   Charge Capture section accepted

## 2018-02-24 NOTE — Anesthesia Postprocedure Evaluation (Signed)
Anesthesia Post Note  Patient: Henriette Combs  Procedure(s) Performed: COLONOSCOPY WITH PROPOFOL (N/A )     Patient location during evaluation: PACU Anesthesia Type: MAC Level of consciousness: awake and alert Pain management: pain level controlled Vital Signs Assessment: post-procedure vital signs reviewed and stable Respiratory status: spontaneous breathing, nonlabored ventilation and respiratory function stable Cardiovascular status: stable and blood pressure returned to baseline Anesthetic complications: no    Last Vitals:  Vitals:   02/23/18 0807 02/23/18 0810  BP: (!) 119/45 (!) 118/47  Pulse: (!) 105 (!) 102  Resp: 20 14  Temp: 36.6 C   SpO2: 97% 97%    Last Pain:  Vitals:   02/23/18 0810  TempSrc:   PainSc: 0-No pain                 Audry Pili

## 2018-02-25 ENCOUNTER — Encounter (HOSPITAL_COMMUNITY): Payer: Self-pay | Admitting: Gastroenterology

## 2018-02-25 NOTE — Anesthesia Postprocedure Evaluation (Signed)
Anesthesia Post Note  Patient: Jennifer Kirk  Procedure(s) Performed: ESOPHAGOGASTRODUODENOSCOPY (EGD) (N/A ) BIOPSY     Patient location during evaluation: PACU Anesthesia Type: MAC Level of consciousness: awake and alert Pain management: pain level controlled Vital Signs Assessment: post-procedure vital signs reviewed and stable Respiratory status: spontaneous breathing, nonlabored ventilation, respiratory function stable and patient connected to nasal cannula oxygen Cardiovascular status: stable and blood pressure returned to baseline Postop Assessment: no apparent nausea or vomiting Anesthetic complications: no    Last Vitals:  Vitals:   02/23/18 0807 02/23/18 0810  BP: (!) 119/45 (!) 118/47  Pulse: (!) 105 (!) 102  Resp: 20 14  Temp: 36.6 C   SpO2: 97% 97%    Last Pain:  Vitals:   02/23/18 0810  TempSrc:   PainSc: 0-No pain   Pain Goal:                 Lidia Collum

## 2018-02-28 ENCOUNTER — Ambulatory Visit
Admission: RE | Admit: 2018-02-28 | Discharge: 2018-02-28 | Disposition: A | Discharge status: Home / Self Care | Payer: Medicare Other | Source: Ambulatory Visit | Attending: Geriatric Medicine | Admitting: Geriatric Medicine

## 2018-02-28 ENCOUNTER — Other Ambulatory Visit: Payer: Self-pay | Admitting: Geriatric Medicine

## 2018-02-28 DIAGNOSIS — R059 Cough, unspecified: Secondary | ICD-10-CM

## 2018-02-28 DIAGNOSIS — R05 Cough: Secondary | ICD-10-CM

## 2018-02-28 DIAGNOSIS — D500 Iron deficiency anemia secondary to blood loss (chronic): Secondary | ICD-10-CM | POA: Diagnosis not present

## 2018-02-28 DIAGNOSIS — Z23 Encounter for immunization: Secondary | ICD-10-CM | POA: Diagnosis not present

## 2018-02-28 DIAGNOSIS — Z79899 Other long term (current) drug therapy: Secondary | ICD-10-CM | POA: Diagnosis not present

## 2018-03-07 ENCOUNTER — Encounter: Payer: Self-pay | Admitting: Gastroenterology

## 2018-03-27 DIAGNOSIS — Z0000 Encounter for general adult medical examination without abnormal findings: Secondary | ICD-10-CM | POA: Diagnosis not present

## 2018-03-27 DIAGNOSIS — M810 Age-related osteoporosis without current pathological fracture: Secondary | ICD-10-CM | POA: Diagnosis not present

## 2018-03-27 DIAGNOSIS — D509 Iron deficiency anemia, unspecified: Secondary | ICD-10-CM | POA: Diagnosis not present

## 2018-03-27 DIAGNOSIS — Z1389 Encounter for screening for other disorder: Secondary | ICD-10-CM | POA: Diagnosis not present

## 2018-03-27 DIAGNOSIS — Z79899 Other long term (current) drug therapy: Secondary | ICD-10-CM | POA: Diagnosis not present

## 2018-03-27 DIAGNOSIS — E7800 Pure hypercholesterolemia, unspecified: Secondary | ICD-10-CM | POA: Diagnosis not present

## 2018-04-11 ENCOUNTER — Encounter: Payer: Self-pay | Admitting: Internal Medicine

## 2018-04-11 ENCOUNTER — Ambulatory Visit (INDEPENDENT_AMBULATORY_CARE_PROVIDER_SITE_OTHER): Payer: Medicare Other | Admitting: Internal Medicine

## 2018-04-11 VITALS — BP 110/66 | HR 72 | Ht <= 58 in | Wt 97.0 lb

## 2018-04-11 DIAGNOSIS — K5903 Drug induced constipation: Secondary | ICD-10-CM

## 2018-04-11 DIAGNOSIS — K5730 Diverticulosis of large intestine without perforation or abscess without bleeding: Secondary | ICD-10-CM | POA: Diagnosis not present

## 2018-04-11 DIAGNOSIS — D508 Other iron deficiency anemias: Secondary | ICD-10-CM | POA: Diagnosis not present

## 2018-04-11 NOTE — Progress Notes (Signed)
Jennifer Kirk 82 y.o. 11/23/1935 [id number]  Assessment & Plan:   Encounter Diagnoses  Name Primary?  . Drug induced constipation Yes  . Other iron deficiency anemia   . Diverticulosis of colon without hemorrhage      1/2 dose Miralax daily and titrate for effect Iron 3x a week Constipation remains a problem she can try another iron preparation she asked about a liquid supplement and I gave her information about a different one as well.  He can follow-up with Dr. Felipa Eth regarding her anemia.  She did get 1 dose of Feraheme when she was hospitalized and if she really cannot tolerate oral treatment it would be reasonable to give her parenteral iron again.  I do not  anticipate other or repeat endoscopy studies unless she cannot hold her hemoglobin at a reasonable level, on iron therapy which should be chronic.  She could be leaking blood from the hiatal hernia that we did not see Cameron erosions, she could have angiodysplasia of the small bowel and we could discover that with a capsule endoscopy but we would generally treat with iron and see if she holds, would reserve that and/or push enteroscopy for persistent issues as described above.  I appreciate the opportunity to care for this patient. CC: [name], MD   Subjective:   Chief Complaint: Constipation, iron deficiency anemia  HPI The patient is here, having been seen in the hospital in September with an iron deficiency anemia though no clear GI bleeding, work-up included an EGD with a paraesophageal hiatal hernia, normal small bowel biopsies i.e. no sprue, and no other blood loss lesions found at colonoscopy.  I thought perhaps meloxicam could have caused small intestinal erosions.  She actually feels better off meloxicam, and says that swelling in her legs and ankles is gone.  She is been struggling with constipation on her sulfate.  She got severely constipated over the last couple of days with the help of Dr.  Felipa Eth and a nurse friend and lots of MiraLAX she feels better.  She is going to go back on ferrous sulfate 3 times a week to see if that works.  She does take Metamucil at baseline. Allergies  Allergen Reactions  . Codeine Nausea And Vomiting  . Penicillins Rash    Has patient had a PCN reaction causing immediate rash, facial/tongue/throat swelling, SOB or lightheadedness with hypotension: Yes Has patient had a PCN reaction causing severe rash involving mucus membranes or skin necrosis: No Has patient had a PCN reaction that required hospitalization: No Has patient had a PCN reaction occurring within the last 10 years: No If all of the above answers are "NO", then may proceed with Cephalosporin use.   . Tramadol Nausea Only    Intolerance   Current Meds  Medication Sig  . acidophilus (RISAQUAD) CAPS Take 1 capsule by mouth daily.  Marland Kitchen ALPRAZolam (XANAX) 0.5 MG tablet Take 0.5 mg by mouth at bedtime.  Marland Kitchen BIOTIN PO Take 1 capsule by mouth daily.  Marland Kitchen CALCIUM PO Take 2 tablets by mouth 2 (two) times daily.  . cholecalciferol (VITAMIN D) 400 UNITS TABS Take 400 Units by mouth daily.  Marland Kitchen denosumab (PROLIA) 60 MG/ML SOSY injection Inject 60 mg into the skin every 6 (six) months.  . famotidine (PEPCID) 40 MG tablet Take 40 mg by mouth daily.  . ferrous sulfate 325 (65 FE) MG tablet Take 1 tablet (325 mg total) by mouth 2 (two) times daily with a meal. (Patient taking  differently: Take 325 mg by mouth 2 (two) times a week. )  . Multiple Vitamin (MULTIVITAMIN WITH MINERALS) TABS Take 1 tablet by mouth daily.   Past Medical History:  Diagnosis Date  . Anxiety   . Degenerative disc disease   . GERD (gastroesophageal reflux disease)   . Hiatal hernia 2010   GERD,esophageal dysmotility and paraesophageal hernia on esophogram  . Osteopetrosis    degenerative spine disease, lumbar  . Scoliosis    Past Surgical History:  Procedure Laterality Date  . BIOPSY  02/22/2018   Procedure: BIOPSY;   Surgeon: Thornton Park, MD;  Location: [facility];  Service: Gastroenterology;;  . BREAST EXCISIONAL BIOPSY Bilateral 08/2002   path: fibrocystic changes.  sclerosing adenosis with microcalcification.  Marland Kitchen BREAST LUMPECTOMY    . COLONOSCOPY  10/2007   screening study.  Dr Owens Loffler.  small internal hemorrhoids, no polyps, no diverticulosis.    . COLONOSCOPY WITH PROPOFOL N/A 02/23/2018   Procedure: COLONOSCOPY WITH PROPOFOL;  Surgeon: [name], MD;  Location: [facility];  Service: Endoscopy;  Laterality: N/A;  . ESOPHAGOGASTRODUODENOSCOPY N/A 02/22/2018   Procedure: ESOPHAGOGASTRODUODENOSCOPY (EGD);  Surgeon: Thornton Park, MD;  Location: [facility];  Service: Gastroenterology;  Laterality: N/A;   Social History   Social History Narrative   The patient is widowed   Is a never smoker and does drink occasional alcohol   family history includes Dementia in her father; Heart disease in her father; Stroke in her mother.   Review of Systems As per HPI Uses a cane when walking Objective:   Physical Exam BP 110/66   Pulse 72   Ht 4\' 10"  (1.473 m)   Wt 97 lb (44 kg)   BMI 20.27 kg/m  A petite elderly white woman in no acute distress Kyphotic Pale Alert and oriented x3 with an appropriate mood and affect

## 2018-04-11 NOTE — Patient Instructions (Signed)
  Please take 1/2 a dose of Miralax daily and titrate as needed.   Try taking your iron 3 x a week.   If you have trouble with the iron you could try some different types of the oral iron.    I appreciate the opportunity to care for you. [name], MD, [facility]

## 2018-05-19 ENCOUNTER — Encounter (HOSPITAL_COMMUNITY): Payer: Self-pay | Admitting: Emergency Medicine

## 2018-05-19 ENCOUNTER — Other Ambulatory Visit: Payer: Self-pay

## 2018-05-19 ENCOUNTER — Emergency Department (HOSPITAL_COMMUNITY): Payer: Medicare Other

## 2018-05-19 ENCOUNTER — Inpatient Hospital Stay (HOSPITAL_COMMUNITY)
Admission: EM | Admit: 2018-05-19 | Discharge: 2018-05-22 | DRG: 389 | Disposition: A | Discharge status: Home / Self Care | Payer: Medicare Other | Attending: Internal Medicine | Admitting: Internal Medicine

## 2018-05-19 DIAGNOSIS — K449 Diaphragmatic hernia without obstruction or gangrene: Secondary | ICD-10-CM | POA: Diagnosis not present

## 2018-05-19 DIAGNOSIS — M419 Scoliosis, unspecified: Secondary | ICD-10-CM | POA: Diagnosis not present

## 2018-05-19 DIAGNOSIS — Q782 Osteopetrosis: Secondary | ICD-10-CM

## 2018-05-19 DIAGNOSIS — R112 Nausea with vomiting, unspecified: Secondary | ICD-10-CM | POA: Diagnosis not present

## 2018-05-19 DIAGNOSIS — K56600 Partial intestinal obstruction, unspecified as to cause: Principal | ICD-10-CM | POA: Diagnosis present

## 2018-05-19 DIAGNOSIS — K56609 Unspecified intestinal obstruction, unspecified as to partial versus complete obstruction: Secondary | ICD-10-CM | POA: Diagnosis not present

## 2018-05-19 DIAGNOSIS — R000 Tachycardia, unspecified: Secondary | ICD-10-CM | POA: Diagnosis not present

## 2018-05-19 DIAGNOSIS — M810 Age-related osteoporosis without current pathological fracture: Secondary | ICD-10-CM | POA: Diagnosis not present

## 2018-05-19 DIAGNOSIS — F419 Anxiety disorder, unspecified: Secondary | ICD-10-CM | POA: Diagnosis present

## 2018-05-19 DIAGNOSIS — K5669 Other partial intestinal obstruction: Secondary | ICD-10-CM | POA: Diagnosis not present

## 2018-05-19 DIAGNOSIS — I10 Essential (primary) hypertension: Secondary | ICD-10-CM | POA: Diagnosis not present

## 2018-05-19 DIAGNOSIS — Z0189 Encounter for other specified special examinations: Secondary | ICD-10-CM

## 2018-05-19 DIAGNOSIS — K219 Gastro-esophageal reflux disease without esophagitis: Secondary | ICD-10-CM | POA: Diagnosis present

## 2018-05-19 DIAGNOSIS — D509 Iron deficiency anemia, unspecified: Secondary | ICD-10-CM | POA: Diagnosis present

## 2018-05-19 DIAGNOSIS — Z79899 Other long term (current) drug therapy: Secondary | ICD-10-CM

## 2018-05-19 LAB — CBC
HCT: 40.8 % (ref 36.0–46.0)
Hemoglobin: 13.3 g/dL (ref 12.0–15.0)
MCH: 31 pg (ref 26.0–34.0)
MCHC: 32.6 g/dL (ref 30.0–36.0)
MCV: 95.1 fL (ref 80.0–100.0)
Platelets: 329 10*3/uL (ref 150–400)
RBC: 4.29 MIL/uL (ref 3.87–5.11)
RDW: 17.1 % — ABNORMAL HIGH (ref 11.5–15.5)
WBC: 12.6 10*3/uL — ABNORMAL HIGH (ref 4.0–10.5)
nRBC: 0 % (ref 0.0–0.2)

## 2018-05-19 LAB — COMPREHENSIVE METABOLIC PANEL
ALBUMIN: 4 g/dL (ref 3.5–5.0)
ALT: 18 U/L (ref 0–44)
ANION GAP: 14 (ref 5–15)
AST: 26 U/L (ref 15–41)
Alkaline Phosphatase: 37 U/L — ABNORMAL LOW (ref 38–126)
BILIRUBIN TOTAL: 0.7 mg/dL (ref 0.3–1.2)
BUN: 13 mg/dL (ref 8–23)
CO2: 22 mmol/L (ref 22–32)
Calcium: 8.6 mg/dL — ABNORMAL LOW (ref 8.9–10.3)
Chloride: 97 mmol/L — ABNORMAL LOW (ref 98–111)
Creatinine, Ser: 0.88 mg/dL (ref 0.44–1.00)
GFR calc Af Amer: >60 mL/min (ref >60)
GFR calc non Af Amer: >60 mL/min (ref >60)
GLUCOSE: 146 mg/dL — AB (ref 70–99)
POTASSIUM: 3.9 mmol/L (ref 3.5–5.1)
SODIUM: 133 mmol/L — AB (ref 135–145)
TOTAL PROTEIN: 6.6 g/dL (ref 6.5–8.1)

## 2018-05-19 LAB — URINALYSIS, ROUTINE W REFLEX MICROSCOPIC
Bilirubin Urine: NEGATIVE
Glucose, UA: NEGATIVE mg/dL
HGB URINE DIPSTICK: NEGATIVE
Ketones, ur: 5 mg/dL — AB
LEUKOCYTES UA: NEGATIVE
Nitrite: NEGATIVE
Protein, ur: NEGATIVE mg/dL
Specific Gravity, Urine: 1.017 (ref 1.005–1.030)
pH: 6 (ref 5.0–8.0)

## 2018-05-19 LAB — LIPASE, BLOOD: Lipase: 22 U/L (ref 11–51)

## 2018-05-19 MED ORDER — ONDANSETRON HCL 4 MG/2ML IJ SOLN
4.0000 mg | Freq: Once | INTRAMUSCULAR | Status: AC
  Administered 2018-05-19: 4 mg via INTRAVENOUS
  Filled 2018-05-19: qty 2

## 2018-05-19 MED ORDER — MORPHINE SULFATE (PF) 2 MG/ML IV SOLN
2.0000 mg | Freq: Once | INTRAVENOUS | Status: AC
  Administered 2018-05-19: 2 mg via INTRAVENOUS
  Filled 2018-05-19: qty 1

## 2018-05-19 MED ORDER — SODIUM CHLORIDE 0.9 % IV BOLUS
1000.0000 mL | Freq: Once | INTRAVENOUS | Status: AC
  Administered 2018-05-19: 1000 mL via INTRAVENOUS

## 2018-05-19 MED ORDER — IOHEXOL 300 MG/ML  SOLN
100.0000 mL | Freq: Once | INTRAMUSCULAR | Status: AC | PRN
  Administered 2018-05-19: 100 mL via INTRAVENOUS

## 2018-05-19 MED ORDER — ONDANSETRON HCL 4 MG/2ML IJ SOLN
4.0000 mg | Freq: Four times a day (QID) | INTRAMUSCULAR | Status: DC | PRN
  Administered 2018-05-20 (×3): 4 mg via INTRAVENOUS
  Filled 2018-05-19 (×4): qty 2

## 2018-05-19 MED ORDER — MORPHINE SULFATE (PF) 2 MG/ML IV SOLN
1.0000 mg | INTRAVENOUS | Status: DC | PRN
  Administered 2018-05-20 – 2018-05-21 (×4): 2 mg via INTRAVENOUS
  Filled 2018-05-19 (×4): qty 1

## 2018-05-19 NOTE — ED Triage Notes (Signed)
Pt c/o intermittent generalized abdominal pain with nausea that started last night. LBM yesterday. Pt has been taking iron supplements for anemia. Denies vomiting.

## 2018-05-19 NOTE — ED Notes (Signed)
Admitting Provider at bedside. 

## 2018-05-19 NOTE — ED Provider Notes (Addendum)
[facility] EMERGENCY DEPARTMENT Provider Note   CSN: [id number] Arrival date & time: 05/19/18  1920     History   Chief Complaint Chief Complaint  Patient presents with  . Abdominal Pain    HPI [name] is a 82 y.o. female.  Patient c/o mid abdominal pain, acute onset yesterday. Pain dull, moderate-sev, non radiating, constant. States nauseated, w dry heaves. No emesis. Had small bm today. No fever/chills. Denies dysuria. Denies hx prior abd surgery except tubal ligation 40+ years ago. No hx gallstones, pud, or pancreatitis. Denies back pain. No chest pain or discomfort.   The history is provided by the patient.  Abdominal Pain   Associated symptoms include nausea. Pertinent negatives include fever, dysuria and headaches.    Past Medical History:  Diagnosis Date  . Anxiety   . Degenerative disc disease   . GERD (gastroesophageal reflux disease)   . Hiatal hernia 2010   GERD,esophageal dysmotility and paraesophageal hernia on esophogram  . Osteopetrosis    degenerative spine disease, lumbar  . Scoliosis     Patient Active Problem List   Diagnosis Date Noted  . Iron deficiency anemia   . Hiatal hernia   . Symptomatic anemia 02/21/2018  . Fatigue 02/21/2018    Past Surgical History:  Procedure Laterality Date  . BIOPSY  02/22/2018   Procedure: BIOPSY;  Surgeon: Thornton Park, MD;  Location: [facility];  Service: Gastroenterology;;  . BREAST EXCISIONAL BIOPSY Bilateral 08/2002   path: fibrocystic changes.  sclerosing adenosis with microcalcification.  Marland Kitchen BREAST LUMPECTOMY    . COLONOSCOPY  10/2007   screening study.  Dr Owens Loffler.  small internal hemorrhoids, no polyps, no diverticulosis.    . COLONOSCOPY WITH PROPOFOL N/A 02/23/2018   Procedure: COLONOSCOPY WITH PROPOFOL;  Surgeon: [name], MD;  Location: [facility] ENDOSCOPY;  Service: Endoscopy;  Laterality: N/A;  . ESOPHAGOGASTRODUODENOSCOPY N/A 02/22/2018   Procedure:  ESOPHAGOGASTRODUODENOSCOPY (EGD);  Surgeon: Thornton Park, MD;  Location: [facility];  Service: Gastroenterology;  Laterality: N/A;     OB History   No obstetric history on file.      Home Medications    Prior to Admission medications   Medication Sig Start Date End Date Taking? Authorizing Provider  acidophilus (RISAQUAD) CAPS Take 1 capsule by mouth daily.    [provider]  ALPRAZolam Duanne Moron) 0.5 MG tablet Take 0.5 mg by mouth at bedtime.    [provider]  BIOTIN PO Take 1 capsule by mouth daily.    [provider]  CALCIUM PO Take 2 tablets by mouth 2 (two) times daily.    [provider]  cholecalciferol (VITAMIN D) 400 UNITS TABS Take 400 Units by mouth daily.    [provider]  denosumab (PROLIA) 60 MG/ML SOSY injection Inject 60 mg into the skin every 6 (six) months.    [provider]  famotidine (PEPCID) 40 MG tablet Take 40 mg by mouth daily.    [provider]  ferrous sulfate 325 (65 FE) MG tablet Take 1 tablet (325 mg total) by mouth 2 (two) times daily with a meal. Patient taking differently: Take 325 mg by mouth 2 (two) times a week.  02/23/18   Thurnell Lose, MD  Multiple Vitamin (MULTIVITAMIN WITH MINERALS) TABS Take 1 tablet by mouth daily.    [provider]    Family History Family History  Problem Relation Age of Onset  . Stroke Mother   . Dementia Father   .  Heart disease Father   . Breast cancer Neg Hx     Social History Social History   Tobacco Use  . Smoking status: Never Smoker  . Smokeless tobacco: Never Used  Substance Use Topics  . Alcohol use: Yes  . Drug use: No     Allergies   Codeine; Penicillins; and Tramadol   Review of Systems Review of Systems  Constitutional: Negative for fever.  HENT: Negative for sore throat.   Eyes: Negative for redness.  Respiratory: Negative for cough and shortness of breath.   Cardiovascular: Negative for chest pain.   Gastrointestinal: Positive for abdominal pain and nausea.  Endocrine: Negative for polyuria.  Genitourinary: Negative for dysuria and flank pain.  Musculoskeletal: Negative for back pain and neck pain.  Skin: Negative for rash.  Neurological: Negative for headaches.  Hematological: Does not bruise/bleed easily.  Psychiatric/Behavioral: Negative for confusion.     Physical Exam Updated Vital Signs BP 123/83   Pulse (!) 102   Temp 97.7 F (36.5 C) (Oral)   Resp 17   SpO2 96%   Physical Exam Vitals signs and nursing note reviewed.  Constitutional:      Appearance: Normal appearance. She is well-developed.  HENT:     Head: Atraumatic.     Mouth/Throat:     Mouth: Mucous membranes are moist.     Pharynx: Oropharynx is clear.  Eyes:     General: No scleral icterus.    Conjunctiva/sclera: Conjunctivae normal.  Neck:     Musculoskeletal: Neck supple.     Trachea: No tracheal deviation.  Cardiovascular:     Rate and Rhythm: Normal rate and regular rhythm.     Pulses: Normal pulses.     Heart sounds: Normal heart sounds. No murmur. No friction rub. No gallop.   Pulmonary:     Effort: Pulmonary effort is normal. No respiratory distress.     Breath sounds: Normal breath sounds.  Abdominal:     General: Abdomen is flat. Bowel sounds are normal. There is no distension.     Palpations: Abdomen is soft. There is no mass.     Tenderness: There is abdominal tenderness. There is no rebound.     Hernia: No hernia is present.     Comments: No puls mass.   Genitourinary:    Comments: No cva tenderness. Musculoskeletal:     Right lower leg: No edema.     Left lower leg: No edema.  Skin:    General: Skin is warm and dry.     Findings: No rash.  Neurological:     Mental Status: She is alert.     Comments: Speech normal. Motor/sens grossly intact.   Psychiatric:        Mood and Affect: Mood normal.      ED Treatments / Results  Labs (all labs ordered are listed, but only  abnormal results are displayed) Results for orders placed or performed during the hospital encounter of 05/19/18  Lipase, blood  Result Value Ref Range   Lipase 22 11 - 51 U/L  Comprehensive metabolic panel  Result Value Ref Range   Sodium 133 (L) 135 - 145 mmol/L   Potassium 3.9 3.5 - 5.1 mmol/L   Chloride 97 (L) 98 - 111 mmol/L   CO2 22 22 - 32 mmol/L   Glucose, Bld 146 (H) 70 - 99 mg/dL   BUN 13 8 - 23 mg/dL   Creatinine, Ser 0.88 0.44 - 1.00 mg/dL   Calcium 8.6 (L)  8.9 - 10.3 mg/dL   Total Protein 6.6 6.5 - 8.1 g/dL   Albumin 4.0 3.5 - 5.0 g/dL   AST 26 15 - 41 U/L   ALT 18 0 - 44 U/L   Alkaline Phosphatase 37 (L) 38 - 126 U/L   Total Bilirubin 0.7 0.3 - 1.2 mg/dL   GFR calc non Af Amer >60 >60 mL/min   GFR calc Af Amer >60 >60 mL/min   Anion gap 14 5 - 15  CBC  Result Value Ref Range   WBC 12.6 (H) 4.0 - 10.5 K/uL   RBC 4.29 3.87 - 5.11 MIL/uL   Hemoglobin 13.3 12.0 - 15.0 g/dL   HCT 40.8 36.0 - 46.0 %   MCV 95.1 80.0 - 100.0 fL   MCH 31.0 26.0 - 34.0 pg   MCHC 32.6 30.0 - 36.0 g/dL   RDW 17.1 (H) 11.5 - 15.5 %   Platelets 329 150 - 400 K/uL   nRBC 0.0 0.0 - 0.2 %  Urinalysis, Routine w reflex microscopic  Result Value Ref Range   Color, Urine YELLOW YELLOW   APPearance HAZY (A) CLEAR   Specific Gravity, Urine 1.017 1.005 - 1.030   pH 6.0 5.0 - 8.0   Glucose, UA NEGATIVE NEGATIVE mg/dL   Hgb urine dipstick NEGATIVE NEGATIVE   Bilirubin Urine NEGATIVE NEGATIVE   Ketones, ur 5 (A) NEGATIVE mg/dL   Protein, ur NEGATIVE NEGATIVE mg/dL   Nitrite NEGATIVE NEGATIVE   Leukocytes, UA NEGATIVE NEGATIVE   Ct Abdomen Pelvis W Contrast  Result Date: 05/19/2018 CLINICAL DATA:  Acute abdominal pain. Upper abdominal pain and dry heaves. EXAM: CT ABDOMEN AND PELVIS WITH CONTRAST TECHNIQUE: Multidetector CT imaging of the abdomen and pelvis was performed using the standard protocol following bolus administration of intravenous contrast. CONTRAST:  172mL OMNIPAQUE IOHEXOL 300  MG/ML  SOLN COMPARISON:  No prior abdominal imaging. FINDINGS: Lower chest: Large hiatal hernia, adjacent compressive atelectasis in the left lower lobe. Tail the pancreas also extends into the hernia defect with tail in the lower thorax. Heart is normal in size. Hepatobiliary: Scattered cysts in the liver, largest measuring 3.5 cm centrally. No suspicious lesion. Gallbladder physiologically distended, no calcified stone. No biliary dilatation. Pancreas: No ductal dilatation or inflammation. Distal body and tail the pancreas extend into the lower thorax through large esophageal/hiatal hernia diaphragmatic defect. Spleen: Splenic cleft inferiorly. Normal in size without focal abnormality. Adrenals/Urinary Tract: Adrenal glands are grossly normal, partially obscured due to distorted anatomy secondary to severe scoliosis. No hydronephrosis or perinephric edema. Homogeneous renal enhancement with symmetric excretion on delayed phase imaging. Urinary bladder is physiologically distended without wall thickening. Stomach/Bowel: Large hiatal hernia with greater than 50% of the stomach being intrathoracic. No evidence of gastric outlet obstruction. Diffusely fluid-filled dilated small bowel in the central abdomen with transition in the right central abdomen, image 37 series 3 and image 20 series 6. transitional cars in area of circumferential small bowel wall thickening with questionable small bowel small bowel intussusception. There is associated mucosal enhancement and mesenteric edema. The more distal small bowel appears decompressed. Possible bowel wall thickening of pelvic small bowel which may be reactive secondary to free fluid, but nonspecific. Upper normal appendix measuring 7 mm with mild appendiceal wall thickening but no intraluminal fluid. Mild adjacent periappendiceal stranding. Small volume of stool in the proximal colon, the more distal colon is decompressed, presence of intra-abdominal ascites partially  limiting evaluation. Distal colonic diverticulosis without diverticulitis. Vascular/Lymphatic: Aortic atherosclerosis and tortuosity. No  aneurysm. Enlarged mesenteric lymph node in the right lower quadrant measuring 12 mm for example images 40 6-47 series 3 versus small mesenteric mass. There are associated punctate calcifications. Paucity of intra-abdominal fat partially limits evaluation for adenopathy. Reproductive: Slight soft tissue prominence of the cervix, likely incidental. No gross adnexal mass. Other: Small to moderate volume abdominopelvic ascites, most prominent in the pelvis in the region of dilated small bowel. No free air. Musculoskeletal: Severe S-shaped scoliotic curvature of the spine, distorting normal anatomy. Associated multilevel degenerative change. No focal bone lesion. IMPRESSION: 1. Findings suspicious for early small bowel obstruction with transition point in the right central abdomen. At the site of transition is circumferential small bowel wall thickening with questionable small bowel intussusception. There is associated mesenteric edema and small to moderate volume abdominopelvic ascites. 2. Enlarged mesenteric lymph node in the right lower quadrant measuring 12 mm versus small mesenteric mass with calcifications. In conjunction with small-bowel findings, findings raise possibility of carcinoid. Alternative small bowel thickening considerations include infectious or inflammatory enteritis. 3. Borderline appendix with wall thickening, but no disproportionate periappendiceal inflammatory changes. Suspect findings are reactive due to regional bowel inflammation rather than appendicitis. 4. Large hiatal hernia with greater than 50% of the stomach being intrathoracic. 5. Colonic diverticulosis without diverticulitis. Aortic Atherosclerosis (ICD10-I70.0). Electronically Signed   By: Keith Rake M.D.   On: 05/19/2018 22:18    EKG EKG Interpretation  Date/Time:  Sunday May 19 2018 19:42:11 EST Ventricular Rate:  107 PR Interval:  112 QRS Duration: 72 QT Interval:  322 QTC Calculation: 429 R Axis:   -9 Text Interpretation:  Sinus tachycardia Nonspecific ST abnormality Confirmed by Lajean Saver [phone]) on 05/19/2018 8:41:48 PM   Radiology Ct Abdomen Pelvis W Contrast  Result Date: 05/19/2018 CLINICAL DATA:  Acute abdominal pain. Upper abdominal pain and dry heaves. EXAM: CT ABDOMEN AND PELVIS WITH CONTRAST TECHNIQUE: Multidetector CT imaging of the abdomen and pelvis was performed using the standard protocol following bolus administration of intravenous contrast. CONTRAST:  147mL OMNIPAQUE IOHEXOL 300 MG/ML  SOLN COMPARISON:  No prior abdominal imaging. FINDINGS: Lower chest: Large hiatal hernia, adjacent compressive atelectasis in the left lower lobe. Tail the pancreas also extends into the hernia defect with tail in the lower thorax. Heart is normal in size. Hepatobiliary: Scattered cysts in the liver, largest measuring 3.5 cm centrally. No suspicious lesion. Gallbladder physiologically distended, no calcified stone. No biliary dilatation. Pancreas: No ductal dilatation or inflammation. Distal body and tail the pancreas extend into the lower thorax through large esophageal/hiatal hernia diaphragmatic defect. Spleen: Splenic cleft inferiorly. Normal in size without focal abnormality. Adrenals/Urinary Tract: Adrenal glands are grossly normal, partially obscured due to distorted anatomy secondary to severe scoliosis. No hydronephrosis or perinephric edema. Homogeneous renal enhancement with symmetric excretion on delayed phase imaging. Urinary bladder is physiologically distended without wall thickening. Stomach/Bowel: Large hiatal hernia with greater than 50% of the stomach being intrathoracic. No evidence of gastric outlet obstruction. Diffusely fluid-filled dilated small bowel in the central abdomen with transition in the right central abdomen, image 37 series 3 and image  20 series 6. transitional cars in area of circumferential small bowel wall thickening with questionable small bowel small bowel intussusception. There is associated mucosal enhancement and mesenteric edema. The more distal small bowel appears decompressed. Possible bowel wall thickening of pelvic small bowel which may be reactive secondary to free fluid, but nonspecific. Upper normal appendix measuring 7 mm with mild appendiceal wall thickening but no intraluminal fluid. Mild  adjacent periappendiceal stranding. Small volume of stool in the proximal colon, the more distal colon is decompressed, presence of intra-abdominal ascites partially limiting evaluation. Distal colonic diverticulosis without diverticulitis. Vascular/Lymphatic: Aortic atherosclerosis and tortuosity. No aneurysm. Enlarged mesenteric lymph node in the right lower quadrant measuring 12 mm for example images 40 6-47 series 3 versus small mesenteric mass. There are associated punctate calcifications. Paucity of intra-abdominal fat partially limits evaluation for adenopathy. Reproductive: Slight soft tissue prominence of the cervix, likely incidental. No gross adnexal mass. Other: Small to moderate volume abdominopelvic ascites, most prominent in the pelvis in the region of dilated small bowel. No free air. Musculoskeletal: Severe S-shaped scoliotic curvature of the spine, distorting normal anatomy. Associated multilevel degenerative change. No focal bone lesion. IMPRESSION: 1. Findings suspicious for early small bowel obstruction with transition point in the right central abdomen. At the site of transition is circumferential small bowel wall thickening with questionable small bowel intussusception. There is associated mesenteric edema and small to moderate volume abdominopelvic ascites. 2. Enlarged mesenteric lymph node in the right lower quadrant measuring 12 mm versus small mesenteric mass with calcifications. In conjunction with small-bowel  findings, findings raise possibility of carcinoid. Alternative small bowel thickening considerations include infectious or inflammatory enteritis. 3. Borderline appendix with wall thickening, but no disproportionate periappendiceal inflammatory changes. Suspect findings are reactive due to regional bowel inflammation rather than appendicitis. 4. Large hiatal hernia with greater than 50% of the stomach being intrathoracic. 5. Colonic diverticulosis without diverticulitis. Aortic Atherosclerosis (ICD10-I70.0). Electronically Signed   By: Keith Rake M.D.   On: 05/19/2018 22:18    Procedures Procedures (including critical care time)  Medications Ordered in ED Medications - No data to display   Initial Impression / Assessment and Plan / ED Course  I have reviewed the triage vital signs and the nursing notes.  Pertinent labs & imaging results that were available during my care of the patient were reviewed by me and considered in my medical decision making (see chart for details).  Iv ns bolus. Labs sent.   Imaging ordered.  Reviewed nursing notes and prior charts for additional history.   Labs reviewed - chem normal. Wbc mildly elev.  Await ct.   Recheck pt, pain improved but persists. Nausea improved.   Ct reviewed - ?sbo, ?intusseception.   Gen surgery consulted - discussed pt and ct - discussed with Dr Barry Dienes, they will see, requests medical service admission.  Hospitalists consulted for admission.    Final Clinical Impressions(s) / ED Diagnoses   Final diagnoses:  None    ED Discharge Orders    None          Lajean Saver, MD 05/19/18 [id number]

## 2018-05-19 NOTE — Consult Note (Signed)
Reason for Consult: SBO Referring Physician: Marvel Plan is an 82 y.o. female.  HPI:  Pt is a 82 yo F who presented to the ED with around 12-18 hours of crampy abdominal pain, nausea, dry heaves, and sweats. She has never had pain like this before.  She has had a BTL before, but no other surgeries.  She denies fever/chills.  She has not had any sick contacts of which she is aware.  She has not personally had a URI or gastroenteritis.    Past Medical History:  Diagnosis Date  . Anxiety   . Degenerative disc disease   . GERD (gastroesophageal reflux disease)   . Hiatal hernia 2010   GERD,esophageal dysmotility and paraesophageal hernia on esophogram  . Osteopetrosis    degenerative spine disease, lumbar  . Scoliosis     Past Surgical History:  Procedure Laterality Date  . BIOPSY  02/22/2018   Procedure: BIOPSY;  Surgeon: Thornton Park, MD;  Location: [facility];  Service: Gastroenterology;;  . BREAST EXCISIONAL BIOPSY Bilateral 08/2002   path: fibrocystic changes.  sclerosing adenosis with microcalcification.  Marland Kitchen BREAST LUMPECTOMY    . COLONOSCOPY  10/2007   screening study.  Dr Owens Loffler.  small internal hemorrhoids, no polyps, no diverticulosis.    . COLONOSCOPY WITH PROPOFOL N/A 02/23/2018   Procedure: COLONOSCOPY WITH PROPOFOL;  Surgeon: [name], MD;  Location: [facility] ENDOSCOPY;  Service: Endoscopy;  Laterality: N/A;  . ESOPHAGOGASTRODUODENOSCOPY N/A 02/22/2018   Procedure: ESOPHAGOGASTRODUODENOSCOPY (EGD);  Surgeon: Thornton Park, MD;  Location: [facility];  Service: Gastroenterology;  Laterality: N/A;    Family History  Problem Relation Age of Onset  . Stroke Mother   . Dementia Father   . Heart disease Father   . Breast cancer Neg Hx     Social History:  reports that she has never smoked. She has never used smokeless tobacco. She reports current alcohol use. She reports that she does not use drugs.  Allergies:  Allergies  Allergen  Reactions  . Codeine Nausea And Vomiting  . Penicillins Rash    Has patient had a PCN reaction causing immediate rash, facial/tongue/throat swelling, SOB or lightheadedness with hypotension: Yes Has patient had a PCN reaction causing severe rash involving mucus membranes or skin necrosis: No Has patient had a PCN reaction that required hospitalization: No Has patient had a PCN reaction occurring within the last 10 years: No If all of the above answers are "NO", then may proceed with Cephalosporin use.   . Tramadol Nausea Only    Intolerance    Medications:  Current Meds  Medication Sig  . acetaminophen (TYLENOL) 500 MG tablet Take 1,000 mg by mouth every 6 (six) hours as needed for headache.  . ALPRAZolam (XANAX) 1 MG tablet Take 0.5 mg by mouth at bedtime.   Marland Kitchen BIOTIN PO Take 1 capsule by mouth daily.  . Calcium Carbonate-Vitamin D (CALCIUM-D PO) Take 2 tablets by mouth 2 (two) times daily.  Marland Kitchen denosumab (PROLIA) 60 MG/ML SOSY injection Inject 60 mg into the skin every 6 (six) months.  . famotidine (PEPCID) 40 MG tablet Take 40 mg by mouth daily before supper.   . Ginger, Zingiber officinalis, (GINGER PO) Take 1 capsule by mouth daily.  . hydrocortisone cream 1 % Apply 1 application topically daily as needed for itching (wound care).  . Multiple Vitamin (MULTIVITAMIN WITH MINERALS) TABS Take 1 tablet by mouth daily.  Marland Kitchen OVER THE COUNTER MEDICATION Take 85 mg by mouth  See admin instructions. Hema-Plex - 85 mg amino chelated iron with 300 mg vitamin C, B complex and minerals - take one tablet by mouth three times weekly (Monday, Wednesday, Friday)  . Polyethyl Glycol-Propyl Glycol (SYSTANE OP) Place 2 drops into both eyes 2 (two) times daily.  . Probiotic Product (ALIGN PO) Take 1 tablet by mouth daily.  . vitamin C (ASCORBIC ACID) 500 MG tablet Take 500 mg by mouth daily.  Marland Kitchen VITAMIN D-VITAMIN K PO Take 1 tablet by mouth daily.     Results for orders placed or performed during the  hospital encounter of 05/19/18 (from the past 48 hour(s))  Lipase, blood     Status: None   Collection Time: 05/19/18  7:52 PM  Result Value Ref Range   Lipase 22 11 - 51 U/L    Comment: Performed at [facility], [place] [address], [place], [place] [address]  Comprehensive metabolic panel     Status: Abnormal   Collection Time: 05/19/18  7:52 PM  Result Value Ref Range   Sodium 133 (L) 135 - 145 mmol/L   Potassium 3.9 3.5 - 5.1 mmol/L   Chloride 97 (L) 98 - 111 mmol/L   CO2 22 22 - 32 mmol/L   Glucose, Bld 146 (H) 70 - 99 mg/dL   BUN 13 8 - 23 mg/dL   Creatinine, Ser 0.88 0.44 - 1.00 mg/dL   Calcium 8.6 (L) 8.9 - 10.3 mg/dL   Total Protein 6.6 6.5 - 8.1 g/dL   Albumin 4.0 3.5 - 5.0 g/dL   AST 26 15 - 41 U/L   ALT 18 0 - 44 U/L   Alkaline Phosphatase 37 (L) 38 - 126 U/L   Total Bilirubin 0.7 0.3 - 1.2 mg/dL   GFR calc non Af Amer >60 >60 mL/min   GFR calc Af Amer >60 >60 mL/min   Anion gap 14 5 - 15    Comment: Performed at [facility], [place] [address] [place], [place] [address]  CBC     Status: Abnormal   Collection Time: 05/19/18  7:52 PM  Result Value Ref Range   WBC 12.6 (H) 4.0 - 10.5 K/uL   RBC 4.29 3.87 - 5.11 MIL/uL   Hemoglobin 13.3 12.0 - 15.0 g/dL   HCT 40.8 36.0 - 46.0 %   MCV 95.1 80.0 - 100.0 fL   MCH 31.0 26.0 - 34.0 pg   MCHC 32.6 30.0 - 36.0 g/dL   RDW 17.1 (H) 11.5 - 15.5 %   Platelets 329 150 - 400 K/uL   nRBC 0.0 0.0 - 0.2 %    Comment: Performed at [facility], [place] [address], [place], [place] [address]  Urinalysis, Routine w reflex microscopic     Status: Abnormal   Collection Time: 05/19/18  8:21 PM  Result Value Ref Range   Color, Urine YELLOW YELLOW   APPearance HAZY (A) CLEAR   Specific Gravity, Urine 1.017 1.005 - 1.030   pH 6.0 5.0 - 8.0   Glucose, UA NEGATIVE NEGATIVE mg/dL   Hgb urine dipstick NEGATIVE NEGATIVE   Bilirubin Urine NEGATIVE NEGATIVE   Ketones, ur 5 (A) NEGATIVE mg/dL   Protein, ur NEGATIVE NEGATIVE mg/dL    Nitrite NEGATIVE NEGATIVE   Leukocytes, UA NEGATIVE NEGATIVE    Comment: Performed at [facility] [address] [place], [place] [address]    Ct Abdomen Pelvis W Contrast  Result Date: 05/19/2018 CLINICAL DATA:  Acute abdominal pain.  Upper abdominal pain and dry heaves. EXAM: CT ABDOMEN AND PELVIS WITH CONTRAST TECHNIQUE: Multidetector CT imaging of the abdomen and pelvis was performed using the standard protocol following bolus administration of intravenous contrast. CONTRAST:  138mL OMNIPAQUE IOHEXOL 300 MG/ML  SOLN COMPARISON:  No prior abdominal imaging. FINDINGS: Lower chest: Large hiatal hernia, adjacent compressive atelectasis in the left lower lobe. Tail the pancreas also extends into the hernia defect with tail in the lower thorax. Heart is normal in size. Hepatobiliary: Scattered cysts in the liver, largest measuring 3.5 cm centrally. No suspicious lesion. Gallbladder physiologically distended, no calcified stone. No biliary dilatation. Pancreas: No ductal dilatation or inflammation. Distal body and tail the pancreas extend into the lower thorax through large esophageal/hiatal hernia diaphragmatic defect. Spleen: Splenic cleft inferiorly. Normal in size without focal abnormality. Adrenals/Urinary Tract: Adrenal glands are grossly normal, partially obscured due to distorted anatomy secondary to severe scoliosis. No hydronephrosis or perinephric edema. Homogeneous renal enhancement with symmetric excretion on delayed phase imaging. Urinary bladder is physiologically distended without wall thickening. Stomach/Bowel: Large hiatal hernia with greater than 50% of the stomach being intrathoracic. No evidence of gastric outlet obstruction. Diffusely fluid-filled dilated small bowel in the central abdomen with transition in the right central abdomen, image 37 series 3 and image 20 series 6. transitional cars in area of circumferential small bowel wall thickening with questionable small bowel  small bowel intussusception. There is associated mucosal enhancement and mesenteric edema. The more distal small bowel appears decompressed. Possible bowel wall thickening of pelvic small bowel which may be reactive secondary to free fluid, but nonspecific. Upper normal appendix measuring 7 mm with mild appendiceal wall thickening but no intraluminal fluid. Mild adjacent periappendiceal stranding. Small volume of stool in the proximal colon, the more distal colon is decompressed, presence of intra-abdominal ascites partially limiting evaluation. Distal colonic diverticulosis without diverticulitis. Vascular/Lymphatic: Aortic atherosclerosis and tortuosity. No aneurysm. Enlarged mesenteric lymph node in the right lower quadrant measuring 12 mm for example images 40 6-47 series 3 versus small mesenteric mass. There are associated punctate calcifications. Paucity of intra-abdominal fat partially limits evaluation for adenopathy. Reproductive: Slight soft tissue prominence of the cervix, likely incidental. No gross adnexal mass. Other: Small to moderate volume abdominopelvic ascites, most prominent in the pelvis in the region of dilated small bowel. No free air. Musculoskeletal: Severe S-shaped scoliotic curvature of the spine, distorting normal anatomy. Associated multilevel degenerative change. No focal bone lesion. IMPRESSION: 1. Findings suspicious for early small bowel obstruction with transition point in the right central abdomen. At the site of transition is circumferential small bowel wall thickening with questionable small bowel intussusception. There is associated mesenteric edema and small to moderate volume abdominopelvic ascites. 2. Enlarged mesenteric lymph node in the right lower quadrant measuring 12 mm versus small mesenteric mass with calcifications. In conjunction with small-bowel findings, findings raise possibility of carcinoid. Alternative small bowel thickening considerations include infectious or  inflammatory enteritis. 3. Borderline appendix with wall thickening, but no disproportionate periappendiceal inflammatory changes. Suspect findings are reactive due to regional bowel inflammation rather than appendicitis. 4. Large hiatal hernia with greater than 50% of the stomach being intrathoracic. 5. Colonic diverticulosis without diverticulitis. Aortic Atherosclerosis (ICD10-I70.0). Electronically Signed   By: Keith Rake M.D.   On: 05/19/2018 22:18    Review of Systems  Constitutional: Negative.   HENT: Negative.   Eyes: Negative.   Respiratory: Negative.   Cardiovascular: Negative.   Gastrointestinal: Positive for abdominal pain, nausea and vomiting.  Genitourinary: Negative.  Musculoskeletal: Negative.   Skin: Negative.   Neurological: Negative.   Endo/Heme/Allergies: Negative.   Psychiatric/Behavioral: Negative.    Blood pressure 123/83, pulse (!) 102, temperature 97.7 F (36.5 C), temperature source Oral, resp. rate 17, SpO2 96 %. Physical Exam  Constitutional: She is oriented to person, place, and time. She appears well-developed and well-nourished. She appears distressed.  Very thin  HENT:  Head: Normocephalic.  Right Ear: External ear normal.  Left Ear: External ear normal.  Eyes: Pupils are equal, round, and reactive to light. Conjunctivae are normal. Right eye exhibits no discharge. Left eye exhibits no discharge. No scleral icterus.  Neck: Neck supple. No tracheal deviation present. No thyromegaly present.  Cardiovascular: Normal rate, regular rhythm, normal heart sounds and intact distal pulses. Exam reveals no gallop.  No murmur heard. Respiratory: Effort normal. No respiratory distress. She has no wheezes. She has no rales. She exhibits no tenderness.  GI: Soft. She exhibits distension (sl distended). She exhibits no mass. There is no abdominal tenderness. There is no rebound and no guarding.  Musculoskeletal: Normal range of motion.        General: No  tenderness, deformity or edema.  Neurological: She is alert and oriented to person, place, and time. Coordination normal.  Skin: Skin is warm and dry. No rash noted. She is not diaphoretic. No erythema. No pallor.  Psychiatric: She has a normal mood and affect. Her behavior is normal. Judgment and thought content normal.    Assessment/Plan: p SBO Possible intusussception on CT ? Enteritis   I suspect this is not truly an area of intussusception, but a psbo related to enteritis.  I agree with NGT and small bowel protocol in AM.   NPO IV fluids.   Hopefully we can determine early if she will need surgery.     Stark Klein 05/19/2018, 10:48 PM

## 2018-05-20 ENCOUNTER — Inpatient Hospital Stay (HOSPITAL_COMMUNITY): Payer: Medicare Other

## 2018-05-20 ENCOUNTER — Observation Stay (HOSPITAL_COMMUNITY): Payer: Medicare Other

## 2018-05-20 ENCOUNTER — Other Ambulatory Visit: Payer: Self-pay

## 2018-05-20 DIAGNOSIS — K5669 Other partial intestinal obstruction: Secondary | ICD-10-CM | POA: Diagnosis not present

## 2018-05-20 DIAGNOSIS — K219 Gastro-esophageal reflux disease without esophagitis: Secondary | ICD-10-CM | POA: Diagnosis present

## 2018-05-20 DIAGNOSIS — K449 Diaphragmatic hernia without obstruction or gangrene: Secondary | ICD-10-CM | POA: Diagnosis not present

## 2018-05-20 DIAGNOSIS — M810 Age-related osteoporosis without current pathological fracture: Secondary | ICD-10-CM | POA: Diagnosis present

## 2018-05-20 DIAGNOSIS — K56609 Unspecified intestinal obstruction, unspecified as to partial versus complete obstruction: Secondary | ICD-10-CM | POA: Diagnosis not present

## 2018-05-20 DIAGNOSIS — R195 Other fecal abnormalities: Secondary | ICD-10-CM | POA: Diagnosis not present

## 2018-05-20 DIAGNOSIS — Z0189 Encounter for other specified special examinations: Secondary | ICD-10-CM | POA: Diagnosis not present

## 2018-05-20 DIAGNOSIS — I10 Essential (primary) hypertension: Secondary | ICD-10-CM | POA: Diagnosis not present

## 2018-05-20 DIAGNOSIS — K56600 Partial intestinal obstruction, unspecified as to cause: Secondary | ICD-10-CM | POA: Diagnosis present

## 2018-05-20 DIAGNOSIS — R112 Nausea with vomiting, unspecified: Secondary | ICD-10-CM | POA: Diagnosis not present

## 2018-05-20 DIAGNOSIS — D509 Iron deficiency anemia, unspecified: Secondary | ICD-10-CM | POA: Diagnosis present

## 2018-05-20 DIAGNOSIS — Q782 Osteopetrosis: Secondary | ICD-10-CM | POA: Diagnosis not present

## 2018-05-20 DIAGNOSIS — F419 Anxiety disorder, unspecified: Secondary | ICD-10-CM | POA: Diagnosis present

## 2018-05-20 DIAGNOSIS — Z79899 Other long term (current) drug therapy: Secondary | ICD-10-CM | POA: Diagnosis not present

## 2018-05-20 DIAGNOSIS — M419 Scoliosis, unspecified: Secondary | ICD-10-CM | POA: Diagnosis present

## 2018-05-20 LAB — BASIC METABOLIC PANEL
Anion gap: 12 (ref 5–15)
BUN: 11 mg/dL (ref 8–23)
CO2: 22 mmol/L (ref 22–32)
Calcium: 8 mg/dL — ABNORMAL LOW (ref 8.9–10.3)
Chloride: 102 mmol/L (ref 98–111)
Creatinine, Ser: 0.76 mg/dL (ref 0.44–1.00)
GFR calc Af Amer: >60 mL/min (ref >60)
GLUCOSE: 142 mg/dL — AB (ref 70–99)
Potassium: 4.1 mmol/L (ref 3.5–5.1)
Sodium: 136 mmol/L (ref 135–145)

## 2018-05-20 LAB — CBC
HCT: 37.7 % (ref 36.0–46.0)
Hemoglobin: 12.5 g/dL (ref 12.0–15.0)
MCH: 31.2 pg (ref 26.0–34.0)
MCHC: 33.2 g/dL (ref 30.0–36.0)
MCV: 94 fL (ref 80.0–100.0)
Platelets: 309 10*3/uL (ref 150–400)
RBC: 4.01 MIL/uL (ref 3.87–5.11)
RDW: 17 % — ABNORMAL HIGH (ref 11.5–15.5)
WBC: 14.5 10*3/uL — ABNORMAL HIGH (ref 4.0–10.5)
nRBC: 0 % (ref 0.0–0.2)

## 2018-05-20 MED ORDER — SODIUM CHLORIDE 0.9% FLUSH
3.0000 mL | Freq: Two times a day (BID) | INTRAVENOUS | Status: DC
  Administered 2018-05-20 (×2): 3 mL via INTRAVENOUS

## 2018-05-20 MED ORDER — METOCLOPRAMIDE HCL 5 MG/ML IJ SOLN
10.0000 mg | Freq: Once | INTRAMUSCULAR | Status: AC
  Administered 2018-05-20: 10 mg via INTRAVENOUS
  Filled 2018-05-20: qty 2

## 2018-05-20 MED ORDER — DIATRIZOATE MEGLUMINE & SODIUM 66-10 % PO SOLN
90.0000 mL | Freq: Once | ORAL | Status: DC
  Filled 2018-05-20: qty 90

## 2018-05-20 MED ORDER — ENOXAPARIN SODIUM 40 MG/0.4ML ~~LOC~~ SOLN
40.0000 mg | SUBCUTANEOUS | Status: DC
  Administered 2018-05-20 – 2018-05-21 (×2): 40 mg via SUBCUTANEOUS
  Filled 2018-05-20 (×2): qty 0.4

## 2018-05-20 MED ORDER — LACTATED RINGERS IV SOLN: INTRAVENOUS | Status: DC

## 2018-05-20 MED ORDER — DIATRIZOATE MEGLUMINE & SODIUM 66-10 % PO SOLN
90.0000 mL | Freq: Once | ORAL | Status: AC
  Administered 2018-05-20: 90 mL via NASOGASTRIC
  Filled 2018-05-20: qty 90

## 2018-05-20 MED ORDER — SODIUM CHLORIDE 0.9 % IV SOLN
INTRAVENOUS | Status: DC
  Administered 2018-05-20 – 2018-05-22 (×3): via INTRAVENOUS

## 2018-05-20 NOTE — Progress Notes (Signed)
Subjective: Passing some flatus.  No c/o abdominal pain.  NGT with essentially no output.  Objective: Vital signs in last 24 hours: Temp:  [97.5 F (36.4 C)-98.2 F (36.8 C)] 97.5 F (36.4 C) (12/16 0517) Pulse Rate:  [100-118] 112 (12/16 0517) Resp:  [12-22] 13 (12/16 0100) BP: (116-164)/(71-89) 120/74 (12/16 0517) SpO2:  [92 %-97 %] 93 % (12/16 0517) Last BM Date: 05/18/18  Intake/Output from previous day: 12/15 0701 - 12/16 0700 In: 2000 [I.V.:1000; IV Piggyback:1000] Out: -  Intake/Output this shift: No intake/output data recorded.  PE: Heart: regular Lungs: CTAB Abd: soft, NT, ND, +BS, NGT flushed.  Only about 50cc of output.  Lab Results:  Recent Labs    05/19/18 1952 05/20/18 0322  WBC 12.6* 14.5*  HGB 13.3 12.5  HCT 40.8 37.7  PLT 329 309   BMET Recent Labs    05/19/18 1952 05/20/18 0322  NA 133* 136  K 3.9 4.1  CL 97* 102  CO2 22 22  GLUCOSE 146* 142*  BUN 13 11  CREATININE 0.88 0.76  CALCIUM 8.6* 8.0*   PT/INR No results for input(s): LABPROT, INR in the last 72 hours. CMP     Component Value Date/Time   NA 136 05/20/2018 0322   K 4.1 05/20/2018 0322   CL 102 05/20/2018 0322   CO2 22 05/20/2018 0322   GLUCOSE 142 (H) 05/20/2018 0322   BUN 11 05/20/2018 0322   CREATININE 0.76 05/20/2018 0322   CALCIUM 8.0 (L) 05/20/2018 0322   PROT 6.6 05/19/2018 1952   ALBUMIN 4.0 05/19/2018 1952   AST 26 05/19/2018 1952   ALT 18 05/19/2018 1952   ALKPHOS 37 (L) 05/19/2018 1952   BILITOT 0.7 05/19/2018 1952   GFRNONAA >60 05/20/2018 0322   GFRAA >60 05/20/2018 0322   Lipase     Component Value Date/Time   LIPASE 22 05/19/2018 1952       Studies/Results: Ct Abdomen Pelvis W Contrast  Result Date: 05/19/2018 CLINICAL DATA:  Acute abdominal pain. Upper abdominal pain and dry heaves. EXAM: CT ABDOMEN AND PELVIS WITH CONTRAST TECHNIQUE: Multidetector CT imaging of the abdomen and pelvis was performed using the standard protocol  following bolus administration of intravenous contrast. CONTRAST:  124mL OMNIPAQUE IOHEXOL 300 MG/ML  SOLN COMPARISON:  No prior abdominal imaging. FINDINGS: Lower chest: Large hiatal hernia, adjacent compressive atelectasis in the left lower lobe. Tail the pancreas also extends into the hernia defect with tail in the lower thorax. Heart is normal in size. Hepatobiliary: Scattered cysts in the liver, largest measuring 3.5 cm centrally. No suspicious lesion. Gallbladder physiologically distended, no calcified stone. No biliary dilatation. Pancreas: No ductal dilatation or inflammation. Distal body and tail the pancreas extend into the lower thorax through large esophageal/hiatal hernia diaphragmatic defect. Spleen: Splenic cleft inferiorly. Normal in size without focal abnormality. Adrenals/Urinary Tract: Adrenal glands are grossly normal, partially obscured due to distorted anatomy secondary to severe scoliosis. No hydronephrosis or perinephric edema. Homogeneous renal enhancement with symmetric excretion on delayed phase imaging. Urinary bladder is physiologically distended without wall thickening. Stomach/Bowel: Large hiatal hernia with greater than 50% of the stomach being intrathoracic. No evidence of gastric outlet obstruction. Diffusely fluid-filled dilated small bowel in the central abdomen with transition in the right central abdomen, image 37 series 3 and image 20 series 6. transitional cars in area of circumferential small bowel wall thickening with questionable small bowel small bowel intussusception. There is associated mucosal enhancement and mesenteric edema. The  more distal small bowel appears decompressed. Possible bowel wall thickening of pelvic small bowel which may be reactive secondary to free fluid, but nonspecific. Upper normal appendix measuring 7 mm with mild appendiceal wall thickening but no intraluminal fluid. Mild adjacent periappendiceal stranding. Small volume of stool in the proximal  colon, the more distal colon is decompressed, presence of intra-abdominal ascites partially limiting evaluation. Distal colonic diverticulosis without diverticulitis. Vascular/Lymphatic: Aortic atherosclerosis and tortuosity. No aneurysm. Enlarged mesenteric lymph node in the right lower quadrant measuring 12 mm for example images 40 6-47 series 3 versus small mesenteric mass. There are associated punctate calcifications. Paucity of intra-abdominal fat partially limits evaluation for adenopathy. Reproductive: Slight soft tissue prominence of the cervix, likely incidental. No gross adnexal mass. Other: Small to moderate volume abdominopelvic ascites, most prominent in the pelvis in the region of dilated small bowel. No free air. Musculoskeletal: Severe S-shaped scoliotic curvature of the spine, distorting normal anatomy. Associated multilevel degenerative change. No focal bone lesion. IMPRESSION: 1. Findings suspicious for early small bowel obstruction with transition point in the right central abdomen. At the site of transition is circumferential small bowel wall thickening with questionable small bowel intussusception. There is associated mesenteric edema and small to moderate volume abdominopelvic ascites. 2. Enlarged mesenteric lymph node in the right lower quadrant measuring 12 mm versus small mesenteric mass with calcifications. In conjunction with small-bowel findings, findings raise possibility of carcinoid. Alternative small bowel thickening considerations include infectious or inflammatory enteritis. 3. Borderline appendix with wall thickening, but no disproportionate periappendiceal inflammatory changes. Suspect findings are reactive due to regional bowel inflammation rather than appendicitis. 4. Large hiatal hernia with greater than 50% of the stomach being intrathoracic. 5. Colonic diverticulosis without diverticulitis. Aortic Atherosclerosis (ICD10-I70.0). Electronically Signed   By: Keith Rake  M.D.   On: 05/19/2018 22:18   Dg Abd Portable 1 View  Result Date: 05/20/2018 CLINICAL DATA:  Nasogastric tube placement. EXAM: PORTABLE ABDOMEN - 1 VIEW COMPARISON:  CT abdomen and pelvis May 19, 2018 FINDINGS: Nasogastric tube looped in large hiatal hernia. A few loops of gas distended bowel in the abdomen corresponding to known obstruction. Contrast in the urinary bladder. Severe upper lumbar levoscoliosis. IMPRESSION: 1. Nasogastric tube tip in large hiatal hernia. 2. A few loops of gas distended bowel consistent with known bowel obstruction. Electronically Signed   By: Elon Alas M.D.   On: 05/20/2018 01:43    Anti-infectives: Anti-infectives (From admission, onward)   None       Assessment/Plan  pSBO -already passing some flatus -will start SBO protocol today -NGT in large hiatal hernia so it isn't contributing a lot to any significant decompression -mobilize and pulm toilet  FEN - NPO/NGT/IVFs VTE - Lovenox ID - none   LOS: 0 days    Henreitta Cea , [facility] 05/20/2018, 11:28 AM Pager: [phone]

## 2018-05-20 NOTE — Plan of Care (Signed)
  Problem: Education: Goal: Knowledge of General Education information will improve Description: Including pain rating scale, medication(s)/side effects and non-pharmacologic comfort measures Outcome: Progressing   Problem: Health Behavior/Discharge Planning: Goal: Ability to manage health-related needs will improve Outcome: Progressing   Problem: Clinical Measurements: Goal: Respiratory complications will improve Outcome: Progressing   Problem: Activity: Goal: Risk for activity intolerance will decrease Outcome: Progressing   Problem: Coping: Goal: Level of anxiety will decrease Outcome: Progressing   Problem: Pain Managment: Goal: General experience of comfort will improve Outcome: Progressing   Problem: Safety: Goal: Ability to remain free from injury will improve Outcome: Progressing   Problem: Skin Integrity: Goal: Risk for impaired skin integrity will decrease Outcome: Progressing   

## 2018-05-20 NOTE — Progress Notes (Signed)
PROGRESS NOTE    Jennifer Kirk  IEP:[id number] DOB: January 24, 1936 DOA: 05/19/2018 PCP: [name], MD   Brief Narrative:  HPI On 05/20/2018 by Dr. Cheri Rous This is an 82 year old woman with medical problems including anxiety, osteoporosis, scoliosis, hypertension, history of iron deficiency anemia discovered in September 2019 without clear cause presenting with abdominal pain nausea and vomiting.  History is obtained via patient report as well as son at the bedside.  She reports 1 day prior to admission developing crampy abdominal pain located periumbilical, severe in intensity.  Associated with nausea and vomiting, mostly dry heaves.  Reports last bowel movement was May 18, 2018 at 11 PM.  Does report flatus the following day.  No aggravating or alleviating symptoms.  She reports at times using some ambulatory assist device on account of her scoliosis, but is independent in her ADLs and IADLs.  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  Lives near [place].  Denies any chest pain, shortness of breath, fevers.  No recent viral illnesses.  Assessment & Plan   Admitted earlier today by Dr. Stana Bunting. See H&P for details.  Partial small bowel obstruction -Patient presenting with 1 day onset of abdominal pain, nausea and vomiting -CT abdomen pelvis is suggestive of early small bowel obstruction -General surgery consulted and appreciated, recommending SBO protocol -NG tube currently in place-no output noted -Continue conservative managemen n.p.o., antiemetics and pain control -Patient does have positive bowel sounds on exam, however continues to complain of immense pain -Will place on IVF  Leukocytosis -Can [place] to the above  Anxiety -home meds  -will place on IV ativan if needed  Essential hypertension -Does not appear to be on any home medications, will continue to  monitor and placed on IV medications as needed -BP currrently stable  Iron deficiency anemia -hemoglobin currently stable, 12.5 -has been taking iron supplemenation -continue to monitor CBC  Osteoporosis -Patient uses denosumab as an outpatient  DVT Prophylaxis  lovenox  Code Status: Full  Family Communication: Son at bedside  Disposition Plan: Admitted. Pending improvement in symptoms/resolution of SBO. Suspect home when medically stable.  Consultants General surgery  Procedures  None  Antibiotics   Anti-infectives (From admission, onward)   None      Subjective:   Jennifer Kirk seen and examined today.  Continues to complain of abdominal pain, 10 out of 10.  States that she does not want to take IV pain medications because it causes her to have nausea.  Denies current vomiting.  Has had some gas passage, with a bowel movement yesterday.  Denies current chest pain, shortness of breath, headache or dizziness.  Objective:   Vitals:   05/20/18 0000 05/20/18 0100 05/20/18 0147 05/20/18 0517  BP: 116/71 (!) 154/80 (!) 143/85 120/74  Pulse: (!) 104 (!) 115 (!) 110 (!) 112  Resp: 12 13    Temp:   98.2 F (36.8 C) (!) 97.5 F (36.4 C)  TempSrc:   Oral Oral  SpO2: 95% 93% 94% 93%  Height:   4\' 10"  (1.473 m)     Intake/Output Summary (Last 24 hours) at 05/20/2018 1239 Last data filed at 05/20/2018 6606 Gross per 24 hour  Intake 2000 ml  Output -  Net 2000 ml   There were no vitals filed for this visit.  Exam  General: Well developed, frail, mild distress (pain)  HEENT: NCAT, mucous membranes moist.  Neck: Supple  Cardiovascular: S1 S2 auscultated, RRR  Respiratory: Clear to auscultation bilaterally with equal chest rise  Abdomen: Soft, TTP, nondistended, + bowel sounds  Extremities: warm dry without cyanosis clubbing or edema  Neuro: AAOx3, nonfocal  Psych: Normal affect and demeanor with intact judgement and insight    Data Reviewed: I have  personally reviewed following labs and imaging studies  CBC: Recent Labs  Lab 05/19/18 1952 05/20/18 0322  WBC 12.6* 14.5*  HGB 13.3 12.5  HCT 40.8 37.7  MCV 95.1 94.0  PLT 329 809   Basic Metabolic Panel: Recent Labs  Lab 05/19/18 1952 05/20/18 0322  NA 133* 136  K 3.9 4.1  CL 97* 102  CO2 22 22  GLUCOSE 146* 142*  BUN 13 11  CREATININE 0.88 0.76  CALCIUM 8.6* 8.0*   GFR: CrCl cannot be calculated (Unknown ideal weight.). Liver Function Tests: Recent Labs  Lab 05/19/18 1952  AST 26  ALT 18  ALKPHOS 37*  BILITOT 0.7  PROT 6.6  ALBUMIN 4.0   Recent Labs  Lab 05/19/18 1952  LIPASE 22   No results for input(s): AMMONIA in the last 168 hours. Coagulation Profile: No results for input(s): INR, PROTIME in the last 168 hours. Cardiac Enzymes: No results for input(s): CKTOTAL, CKMB, CKMBINDEX, TROPONINI in the last 168 hours. BNP (last 3 results) No results for input(s): PROBNP in the last 8760 hours. HbA1C: No results for input(s): HGBA1C in the last 72 hours. CBG: No results for input(s): GLUCAP in the last 168 hours. Lipid Profile: No results for input(s): CHOL, HDL, LDLCALC, TRIG, CHOLHDL, LDLDIRECT in the last 72 hours. Thyroid Function Tests: No results for input(s): TSH, T4TOTAL, FREET4, T3FREE, THYROIDAB in the last 72 hours. Anemia Panel: No results for input(s): VITAMINB12, FOLATE, FERRITIN, TIBC, IRON, RETICCTPCT in the last 72 hours. Urine analysis:    Component Value Date/Time   COLORURINE YELLOW 05/19/2018 2021   APPEARANCEUR HAZY (A) 05/19/2018 2021   LABSPEC 1.017 05/19/2018 2021   PHURINE 6.0 05/19/2018 2021   GLUCOSEU NEGATIVE 05/19/2018 2021   HGBUR NEGATIVE 05/19/2018 2021   [place] NEGATIVE 05/19/2018 2021   KETONESUR 5 (A) 05/19/2018 2021   PROTEINUR NEGATIVE 05/19/2018 2021   NITRITE NEGATIVE 05/19/2018 2021   LEUKOCYTESUR NEGATIVE 05/19/2018 2021   Sepsis Labs: @LABRCNTIP (procalcitonin:4,lacticidven:4)  )No results  found for this or any previous visit (from the past 240 hour(s)).    Radiology Studies: Ct Abdomen Pelvis W Contrast  Result Date: 05/19/2018 CLINICAL DATA:  Acute abdominal pain. Upper abdominal pain and dry heaves. EXAM: CT ABDOMEN AND PELVIS WITH CONTRAST TECHNIQUE: Multidetector CT imaging of the abdomen and pelvis was performed using the standard protocol following bolus administration of intravenous contrast. CONTRAST:  157mL OMNIPAQUE IOHEXOL 300 MG/ML  SOLN COMPARISON:  No prior abdominal imaging. FINDINGS: Lower chest: Large hiatal hernia, adjacent compressive atelectasis in the left lower lobe. Tail the pancreas also extends into the hernia defect with tail in the lower thorax. Heart is normal in size. Hepatobiliary: Scattered cysts in the liver, largest measuring 3.5 cm centrally. No suspicious lesion. Gallbladder physiologically distended, no calcified stone. No biliary dilatation. Pancreas: No ductal dilatation or inflammation. Distal body and tail the pancreas extend into the lower thorax through large esophageal/hiatal hernia diaphragmatic defect. Spleen: Splenic cleft inferiorly. Normal in size without focal abnormality. Adrenals/Urinary Tract: Adrenal glands are grossly normal, partially obscured due to distorted anatomy secondary to severe scoliosis. No hydronephrosis or perinephric edema. Homogeneous renal enhancement with symmetric excretion on  delayed phase imaging. Urinary bladder is physiologically distended without wall thickening. Stomach/Bowel: Large hiatal hernia with greater than 50% of the stomach being intrathoracic. No evidence of gastric outlet obstruction. Diffusely fluid-filled dilated small bowel in the central abdomen with transition in the right central abdomen, image 37 series 3 and image 20 series 6. transitional cars in area of circumferential small bowel wall thickening with questionable small bowel small bowel intussusception. There is associated mucosal enhancement  and mesenteric edema. The more distal small bowel appears decompressed. Possible bowel wall thickening of pelvic small bowel which may be reactive secondary to free fluid, but nonspecific. Upper normal appendix measuring 7 mm with mild appendiceal wall thickening but no intraluminal fluid. Mild adjacent periappendiceal stranding. Small volume of stool in the proximal colon, the more distal colon is decompressed, presence of intra-abdominal ascites partially limiting evaluation. Distal colonic diverticulosis without diverticulitis. Vascular/Lymphatic: Aortic atherosclerosis and tortuosity. No aneurysm. Enlarged mesenteric lymph node in the right lower quadrant measuring 12 mm for example images 40 6-47 series 3 versus small mesenteric mass. There are associated punctate calcifications. Paucity of intra-abdominal fat partially limits evaluation for adenopathy. Reproductive: Slight soft tissue prominence of the cervix, likely incidental. No gross adnexal mass. Other: Small to moderate volume abdominopelvic ascites, most prominent in the pelvis in the region of dilated small bowel. No free air. Musculoskeletal: Severe S-shaped scoliotic curvature of the spine, distorting normal anatomy. Associated multilevel degenerative change. No focal bone lesion. IMPRESSION: 1. Findings suspicious for early small bowel obstruction with transition point in the right central abdomen. At the site of transition is circumferential small bowel wall thickening with questionable small bowel intussusception. There is associated mesenteric edema and small to moderate volume abdominopelvic ascites. 2. Enlarged mesenteric lymph node in the right lower quadrant measuring 12 mm versus small mesenteric mass with calcifications. In conjunction with small-bowel findings, findings raise possibility of carcinoid. Alternative small bowel thickening considerations include infectious or inflammatory enteritis. 3. Borderline appendix with wall thickening,  but no disproportionate periappendiceal inflammatory changes. Suspect findings are reactive due to regional bowel inflammation rather than appendicitis. 4. Large hiatal hernia with greater than 50% of the stomach being intrathoracic. 5. Colonic diverticulosis without diverticulitis. Aortic Atherosclerosis (ICD10-I70.0). Electronically Signed   By: Keith Rake M.D.   On: 05/19/2018 22:18   Dg Abd Portable 1 View  Result Date: 05/20/2018 CLINICAL DATA:  Nasogastric tube placement. EXAM: PORTABLE ABDOMEN - 1 VIEW COMPARISON:  CT abdomen and pelvis May 19, 2018 FINDINGS: Nasogastric tube looped in large hiatal hernia. A few loops of gas distended bowel in the abdomen corresponding to known obstruction. Contrast in the urinary bladder. Severe upper lumbar levoscoliosis. IMPRESSION: 1. Nasogastric tube tip in large hiatal hernia. 2. A few loops of gas distended bowel consistent with known bowel obstruction. Electronically Signed   By: Elon Alas M.D.   On: 05/20/2018 01:43     Scheduled Meds: . diatrizoate meglumine-sodium  90 mL Per NG tube Once  . enoxaparin (LOVENOX) injection  40 mg Subcutaneous Q24H  . sodium chloride flush  3 mL Intravenous Q12H   Continuous Infusions: . sodium chloride 75 mL/hr at 05/20/18 0926     LOS: 0 days   Time Spent in minutes   30 minutes  Breon Diss D.O. on 05/20/2018 at 12:39 PM  Between 7am to 7pm - Please see pager noted on [email or web address]  After 7pm go to [email or web address]  And look for the night coverage person covering for me after hours  [facility] Office  [phone]

## 2018-05-20 NOTE — ED Notes (Signed)
Attempted NG tube placement without success. Seeking assistance.

## 2018-05-20 NOTE — H&P (Signed)
History and Physical   Jennifer Kirk XVQ:[id number] DOB: [birth year] DOA: 05/19/2018  PCP: [name], MD  Chief Complaint: abdominal pain  HPI: This is an 82 year old woman with medical problems including anxiety, osteoporosis, scoliosis, hypertension, history of iron deficiency anemia discovered in September 2019 without clear cause presenting with abdominal pain nausea and vomiting.  History is obtained via patient report as well as son at the bedside.  She reports 1 day prior to admission developing crampy abdominal pain located periumbilical, severe in intensity.  Associated with nausea and vomiting, mostly dry heaves.  Reports last bowel movement was May 18, 2018 at 11 PM.  Does report flatus the following day.  No aggravating or alleviating symptoms.  She reports at times using some ambulatory assist device on account of her scoliosis, but is independent in her ADLs and IADLs.  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  [place] near .  Denies any chest pain, shortness of breath, fevers.  No recent viral illnesses.  ED Course: Vital signs remarkable for tachycardia heart rate max 118, most recent 093, systolic blood pressure ranging from 120s to 164.  CBC with white count of 12.6.  BMP unremarkable.  UA with ketones.  CT of the abdomen pelvis remarkable for large hiatal hernia with atelectasis of the left lower lobe, findings suspicious for early small bowel obstruction with transition point in the right central abdomen.  General surgery consulted for optimal management, they recommended n.p.o., place an NG tube, small bowel protocol imaging; pain management, admission to hospital medicine for further comanagement.  Review of Systems: A complete ROS was obtained; pertinent positives negatives are denoted in the HPI. Otherwise, all systems are negative.   Past Medical  History:  Diagnosis Date  . Anxiety   . Degenerative disc disease   . GERD (gastroesophageal reflux disease)   . Hiatal hernia 2010   GERD,esophageal dysmotility and paraesophageal hernia on esophogram  . Osteopetrosis    degenerative spine disease, lumbar  . Scoliosis    Social History   Socioeconomic History  . Marital status: Widowed    Spouse name: Not on file  . Number of children: Not on file  . Years of education: Not on file  . Highest education level: Not on file  Occupational History  . Not on file  Social Needs  . Financial resource strain: Not on file  . Food insecurity:    Worry: Not on file    Inability: Not on file  . Transportation needs:    Medical: Not on file    Non-medical: Not on file  Tobacco Use  . Smoking status: Never Smoker  . Smokeless tobacco: Never Used  Substance and Sexual Activity  . Alcohol use: Yes  . Drug use: No  . Sexual activity: Not on file  Lifestyle  . Physical activity:    Days per week: Not on file    Minutes per session: Not on file  . Stress: Not on file  Relationships  . Social connections:    Talks on phone: Not on file    Gets together: Not on file    Attends religious service: Not on file    Active member of club or organization: Not on file    Attends meetings of clubs or organizations: Not on file    Relationship status: Not on file  . Intimate partner violence:    Fear  of current or ex partner: Not on file    Emotionally abused: Not on file    Physically abused: Not on file    Forced sexual activity: Not on file  Other Topics Concern  . Not on file  Social History Narrative   The patient is widowed   Is a never smoker and does drink occasional alcohol   Family History  Problem Relation Age of Onset  . Stroke Mother   . Dementia Father   . Heart disease Father   . Breast cancer Neg Hx     Physical Exam: Vitals:   05/19/18 2230 05/19/18 2300 05/19/18 2315 05/19/18 2330  BP: (!) 152/83 (!) 162/89   (!) 164/84  Pulse: (!) 118 (!) 116 (!) 113 (!) 109  Resp: (!) 22 17 15 12   Temp:      TempSrc:      SpO2: 96% 96% 96% 92%   General: Frail elderly white woman, mild discomfort, alert and oriented. ENT: Grossly normal hearing, MMM. Cardiovascular: RRR. No M/R/G. No LE edema.  Respiratory: CTA bilaterally. No wheezes or crackles. Normal respiratory effort.  Breathing room air Abdomen: Bowel sounds present, mildly distended, tender to palpation periumbilical region Skin: No rash or induration seen on limited exam. Musculoskeletal: Grossly normal tone BUE/BLE. Appropriate ROM.  Globally decreased muscle mass Psychiatric: Grossly normal mood and affect. Neurologic: Moves all extremities in coordinated fashion.  I have personally reviewed the following labs, culture data, and imaging studies.  Assessment/Plan:  #Partial small bowel obstruction, likely Course: presenting symptoms of 1 day onset of abdominal pain, nausea, and vomiting. CT of abdomen and pelvis suggestive of early SBO A/P: occurring in the background of iron deficiency anemia discovered earlier this year is worrisome for small bowel malignancy as cause; will heed general surgery team's recommendations and continue with IVF, IV anti-emetic and pain control, NGT to suction, and have placed radiographic imaging (SBO protocol) to be performed later this morning. If becomes febrile or septic appearing, initiate antimicrobial therapy. NPO.  #Other problems: -Anxiety: takes alprazolam 0.5 mg qhs; hold for now, consider IV benzodiazepine if needed -Osteoporosis: present on admission, on outpatient denosumab -HTN: patient reports not on ay medication in the OP setting -Iron deficiency anemia: has resolved from 02/2018; she wonders if PO iron is causing some of these symptoms -Hiatal hernia: present, causing some LLL atelectasis  DVT prophylaxis: Subq Lovenox Code Status: full code upon admission discussion Disposition Plan: Anticipate  D/C home when medically stable Consults called: general surgery team consulted by emergency medicine team, appreciate their ongoing input Admission status: admit to hospital medicine service.   Cheri Rous, MD [facility] [phone]  If 7PM-7AM, please contact night-coverage [email or web address] Password [email or web address]  This document was created using the aid of voice recognition / dication software.

## 2018-05-21 DIAGNOSIS — F419 Anxiety disorder, unspecified: Secondary | ICD-10-CM

## 2018-05-21 DIAGNOSIS — I10 Essential (primary) hypertension: Secondary | ICD-10-CM

## 2018-05-21 LAB — CBC
HEMATOCRIT: 32.6 % — AB (ref 36.0–46.0)
Hemoglobin: 10.6 g/dL — ABNORMAL LOW (ref 12.0–15.0)
MCH: 31.4 pg (ref 26.0–34.0)
MCHC: 32.5 g/dL (ref 30.0–36.0)
MCV: 96.4 fL (ref 80.0–100.0)
Platelets: 230 10*3/uL (ref 150–400)
RBC: 3.38 MIL/uL — ABNORMAL LOW (ref 3.87–5.11)
RDW: 17.1 % — ABNORMAL HIGH (ref 11.5–15.5)
WBC: 8 10*3/uL (ref 4.0–10.5)
nRBC: 0 % (ref 0.0–0.2)

## 2018-05-21 LAB — BASIC METABOLIC PANEL
Anion gap: 9 (ref 5–15)
BUN: 11 mg/dL (ref 8–23)
CO2: 25 mmol/L (ref 22–32)
Calcium: 7.3 mg/dL — ABNORMAL LOW (ref 8.9–10.3)
Chloride: 107 mmol/L (ref 98–111)
Creatinine, Ser: 0.73 mg/dL (ref 0.44–1.00)
GFR calc non Af Amer: >60 mL/min (ref >60)
Glucose, Bld: 94 mg/dL (ref 70–99)
Potassium: 3.4 mmol/L — ABNORMAL LOW (ref 3.5–5.1)
Sodium: 141 mmol/L (ref 135–145)

## 2018-05-21 MED ORDER — ALPRAZOLAM 0.5 MG PO TABS
0.5000 mg | ORAL_TABLET | Freq: Every day | ORAL | Status: DC
  Administered 2018-05-21: 0.5 mg via ORAL
  Filled 2018-05-21: qty 1

## 2018-05-21 MED ORDER — FAMOTIDINE 20 MG PO TABS: 40.0000 mg | ORAL_TABLET | Freq: Every day | ORAL | Status: DC

## 2018-05-21 MED ORDER — ACETAMINOPHEN 325 MG PO TABS: 650.0000 mg | ORAL_TABLET | Freq: Four times a day (QID) | ORAL | Status: DC | PRN

## 2018-05-21 MED ORDER — FAMOTIDINE 20 MG PO TABS
20.0000 mg | ORAL_TABLET | Freq: Every day | ORAL | Status: DC
  Administered 2018-05-21: 20 mg via ORAL
  Filled 2018-05-21: qty 1

## 2018-05-21 NOTE — Plan of Care (Signed)

## 2018-05-21 NOTE — Progress Notes (Signed)
[facility] Progress Note     Subjective: CC:  Having flatus. Had 4 BMs. Mobilizing to bathroom. Abdominal discomfort improving. C/o NG discomfort. Feels her oral iron therapy caused this event.   Objective: Vital signs in last 24 hours: Temp:  [98 F (36.7 C)-99.6 F (37.6 C)] 98.2 F (36.8 C) (12/17 0524) Pulse Rate:  [95-98] 95 (12/17 0524) Resp:  [15-18] 15 (12/17 0524) BP: (129-136)/(68-73) 136/68 (12/17 0524) SpO2:  [88 %-94 %] 94 % (12/17 0524) Last BM Date: 05/21/18  Intake/Output from previous day: 12/16 0701 - 12/17 0700 In: 0  Out: 110 [Emesis/NG output:110] Intake/Output this shift: No intake/output data recorded.  PE: Gen:  Alert, NAD, pleasant Card:  Regular rate and rhythm, pedal pulses 2+ BL Pulm:  Normal effort, clear to auscultation bilaterally Abd: Soft, non-tender, mild distention, +BS NG removed at bedside. Skin: warm and dry, no rashes  Psych: A&Ox3   Lab Results:  Recent Labs    05/20/18 0322 05/21/18 0352  WBC 14.5* 8.0  HGB 12.5 10.6*  HCT 37.7 32.6*  PLT 309 230   BMET Recent Labs    05/20/18 0322 05/21/18 0352  NA 136 141  K 4.1 3.4*  CL 102 107  CO2 22 25  GLUCOSE 142* 94  BUN 11 11  CREATININE 0.76 0.73  CALCIUM 8.0* 7.3*   PT/INR No results for input(s): LABPROT, INR in the last 72 hours. CMP     Component Value Date/Time   NA 141 05/21/2018 0352   K 3.4 (L) 05/21/2018 0352   CL 107 05/21/2018 0352   CO2 25 05/21/2018 0352   GLUCOSE 94 05/21/2018 0352   BUN 11 05/21/2018 0352   CREATININE 0.73 05/21/2018 0352   CALCIUM 7.3 (L) 05/21/2018 0352   PROT 6.6 05/19/2018 1952   ALBUMIN 4.0 05/19/2018 1952   AST 26 05/19/2018 1952   ALT 18 05/19/2018 1952   ALKPHOS 37 (L) 05/19/2018 1952   BILITOT 0.7 05/19/2018 1952   GFRNONAA >60 05/21/2018 0352   GFRAA >60 05/21/2018 0352   Lipase     Component Value Date/Time   LIPASE 22 05/19/2018 1952       Studies/Results: Ct Abdomen Pelvis W  Contrast  Result Date: 05/19/2018 CLINICAL DATA:  Acute abdominal pain. Upper abdominal pain and dry heaves. EXAM: CT ABDOMEN AND PELVIS WITH CONTRAST TECHNIQUE: Multidetector CT imaging of the abdomen and pelvis was performed using the standard protocol following bolus administration of intravenous contrast. CONTRAST:  153mL OMNIPAQUE IOHEXOL 300 MG/ML  SOLN COMPARISON:  No prior abdominal imaging. FINDINGS: Lower chest: Large hiatal hernia, adjacent compressive atelectasis in the left lower lobe. Tail the pancreas also extends into the hernia defect with tail in the lower thorax. Heart is normal in size. Hepatobiliary: Scattered cysts in the liver, largest measuring 3.5 cm centrally. No suspicious lesion. Gallbladder physiologically distended, no calcified stone. No biliary dilatation. Pancreas: No ductal dilatation or inflammation. Distal body and tail the pancreas extend into the lower thorax through large esophageal/hiatal hernia diaphragmatic defect. Spleen: Splenic cleft inferiorly. Normal in size without focal abnormality. Adrenals/Urinary Tract: Adrenal glands are grossly normal, partially obscured due to distorted anatomy secondary to severe scoliosis. No hydronephrosis or perinephric edema. Homogeneous renal enhancement with symmetric excretion on delayed phase imaging. Urinary bladder is physiologically distended without wall thickening. Stomach/Bowel: Large hiatal hernia with greater than 50% of the stomach being intrathoracic. No evidence of gastric outlet obstruction. Diffusely fluid-filled dilated small bowel in the central abdomen with transition  in the right central abdomen, image 37 series 3 and image 20 series 6. transitional cars in area of circumferential small bowel wall thickening with questionable small bowel small bowel intussusception. There is associated mucosal enhancement and mesenteric edema. The more distal small bowel appears decompressed. Possible bowel wall thickening of pelvic  small bowel which may be reactive secondary to free fluid, but nonspecific. Upper normal appendix measuring 7 mm with mild appendiceal wall thickening but no intraluminal fluid. Mild adjacent periappendiceal stranding. Small volume of stool in the proximal colon, the more distal colon is decompressed, presence of intra-abdominal ascites partially limiting evaluation. Distal colonic diverticulosis without diverticulitis. Vascular/Lymphatic: Aortic atherosclerosis and tortuosity. No aneurysm. Enlarged mesenteric lymph node in the right lower quadrant measuring 12 mm for example images 40 6-47 series 3 versus small mesenteric mass. There are associated punctate calcifications. Paucity of intra-abdominal fat partially limits evaluation for adenopathy. Reproductive: Slight soft tissue prominence of the cervix, likely incidental. No gross adnexal mass. Other: Small to moderate volume abdominopelvic ascites, most prominent in the pelvis in the region of dilated small bowel. No free air. Musculoskeletal: Severe S-shaped scoliotic curvature of the spine, distorting normal anatomy. Associated multilevel degenerative change. No focal bone lesion. IMPRESSION: 1. Findings suspicious for early small bowel obstruction with transition point in the right central abdomen. At the site of transition is circumferential small bowel wall thickening with questionable small bowel intussusception. There is associated mesenteric edema and small to moderate volume abdominopelvic ascites. 2. Enlarged mesenteric lymph node in the right lower quadrant measuring 12 mm versus small mesenteric mass with calcifications. In conjunction with small-bowel findings, findings raise possibility of carcinoid. Alternative small bowel thickening considerations include infectious or inflammatory enteritis. 3. Borderline appendix with wall thickening, but no disproportionate periappendiceal inflammatory changes. Suspect findings are reactive due to regional  bowel inflammation rather than appendicitis. 4. Large hiatal hernia with greater than 50% of the stomach being intrathoracic. 5. Colonic diverticulosis without diverticulitis. Aortic Atherosclerosis (ICD10-I70.0). Electronically Signed   By: Keith Rake M.D.   On: 05/19/2018 22:18   Dg Abd Portable 1v-small Bowel Obstruction Protocol-initial, 8 Hr Delay  Result Date: 05/20/2018 CLINICAL DATA:  History of small bowel dilatation, 8 hour follow-up examination following contrast administration. EXAM: PORTABLE ABDOMEN - 1 VIEW COMPARISON:  Film from earlier in the same day. FINDINGS: Nasogastric catheter has been common off the film. The administered contrast material has passed through the small bowel into the colon. A minimal amount of residual contrast within the small bowel is seen. Minimally prominent loops are noted although the previously seen dilatation has predominately resolved. IMPRESSION: Administered contrast has passed almost completely into the colon. Electronically Signed   By: Inez Catalina M.D.   On: 05/20/2018 21:43   Dg Abd Portable 1 View  Result Date: 05/20/2018 CLINICAL DATA:  Nasogastric tube placement. EXAM: PORTABLE ABDOMEN - 1 VIEW COMPARISON:  CT abdomen and pelvis May 19, 2018 FINDINGS: Nasogastric tube looped in large hiatal hernia. A few loops of gas distended bowel in the abdomen corresponding to known obstruction. Contrast in the urinary bladder. Severe upper lumbar levoscoliosis. IMPRESSION: 1. Nasogastric tube tip in large hiatal hernia. 2. A few loops of gas distended bowel consistent with known bowel obstruction. Electronically Signed   By: Elon Alas M.D.   On: 05/20/2018 01:43    Anti-infectives: Anti-infectives (From admission, onward)   None     Assessment/Plan pSBO - SB protocol 12/16 >> contrast in the colon - having flatus and BMs  -  D/C NG tube and advance to SOFT diet as tolerated - mobilize - we discussed dietary recommendations  following SBO - low residue diet for 1 week then gradually advance diet back to high fiber to avoid constipation.   FEN - full liquids, advance to SOFT as tolerated  VTE - Lovenox ID - none  LOS: 1 day    Obie Dredge, [facility] Pager: [phone]

## 2018-05-21 NOTE — Plan of Care (Signed)

## 2018-05-21 NOTE — Progress Notes (Signed)
PROGRESS NOTE    Jennifer Kirk  IDP:[id number] DOB: [birth year] DOA: 05/19/2018 PCP: [name], MD   Brief Narrative:  HPI On 05/20/2018 by Dr. Cheri Rous This is an 82 year old woman with medical problems including anxiety, osteoporosis, scoliosis, hypertension, history of iron deficiency anemia discovered in September 2019 without clear cause presenting with abdominal pain nausea and vomiting.  History is obtained via patient report as well as son at the bedside.  She reports 1 day prior to admission developing crampy abdominal pain located periumbilical, severe in intensity.  Associated with nausea and vomiting, mostly dry heaves.  Reports last bowel movement was May 18, 2018 at 11 PM.  Does report flatus the following day.  No aggravating or alleviating symptoms.  She reports at times using some ambulatory assist device on account of her scoliosis, but is independent in her ADLs and IADLs.  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  Lives near [place].  Denies any chest pain, shortness of breath, fevers.  No recent viral illnesses.  Interim history Admitted for small bowel obstruction, general surgery consulted and appreciated.  Improving slowly, now placed on full liquid diet.  Assessment & Plan   Partial small bowel obstruction -Patient presenting with 1 day onset of abdominal pain, nausea and vomiting -CT abdomen pelvis is suggestive of early small bowel obstruction -General surgery consulted and appreciated -NG tube currently in place-no output noted -On exam, patient with positive bowel sounds, flatus and states she is having bowel movements -Given small bowel protocol on 05/20/2016, with x-ray showing contrast throughout the colon -Patient being placed on full liquid diet by general surgery today and to advance to soft diet as tolerated -NG tube to be  discontinued today. -Continue to mobilize patient -Continue pain control and antiemetics as needed -Continue IV fluids until patient is able to tolerate oral intake  Leukocytosis -Resolved, secondary to the above  Anxiety -Will restart patient's home xanax  Essential hypertension -Does not appear to be on any home medications, will continue to monitor and placed on IV medications as needed -Pressure appears to be stable  Iron deficiency anemia -hemoglobin down to 10.6.   -Unfortunately unable to deduce baseline given previous labs in epic system.  Suspect drop in in hemoglobin likely secondary to a dilutional component as patient was receiving IV fluids. -has been taking iron supplementation and feel this led to her SBO -continue to monitor CBC  Osteoporosis -Patient uses denosumab as an outpatient  DVT Prophylaxis  lovenox  Code Status: Full  Family Communication: Son at bedside  Disposition Plan: Admitted.  Bowel obstruction improving, suspect patient will likely be able to go home on 05/22/2018.  Consultants General surgery  Procedures  None  Antibiotics   Anti-infectives (From admission, onward)   None      Subjective:   Jennifer Kirk seen and examined today.  Feels abdominal pain is improving continues to have slight pain.  Feels very thirsty.  Denies current chest pain, shortness of breath, nausea or vomiting, headache or dizziness.  Was able to have some gas passage along with bowel movement.    Objective:   Vitals:   05/20/18 0706 05/20/18 1336 05/20/18 2226 05/21/18 0524  BP:  129/73 136/69 136/68  Pulse:  98 97 95  Resp:  16 18 15   Temp:  98 F (36.7 C) 99.6 F (37.6 C) 98.2 F (36.8 C)  TempSrc:  Oral Oral Oral  SpO2:  94% (!) 88% 94%  Weight: 44 kg     Height: 4\' 10"  (1.473 m)       Intake/Output Summary (Last 24 hours) at 05/21/2018 1217 Last data filed at 05/21/2018 0842 Gross per 24 hour  Intake 0 ml  Output 110 ml  Net -110 ml     Filed Weights   05/20/18 0706  Weight: 44 kg   Exam  General: Well developed, thin, elderly, NAD  HEENT: NCAT, mucous membranes moist.   Neck: Supple  Cardiovascular: S1 S2 auscultated, no murmur, RRR  Respiratory: Clear to auscultation bilaterally with equal chest rise  Abdomen: Soft, nontender, mildly distended, + bowel sounds  Extremities: warm dry without cyanosis clubbing or edema  Neuro: AAOx3, nonfocal  Psych: Pleasant, appropriate mood and affect  Data Reviewed: I have personally reviewed following labs and imaging studies  CBC: Recent Labs  Lab 05/19/18 1952 05/20/18 0322 05/21/18 0352  WBC 12.6* 14.5* 8.0  HGB 13.3 12.5 10.6*  HCT 40.8 37.7 32.6*  MCV 95.1 94.0 96.4  PLT 329 309 301   Basic Metabolic Panel: Recent Labs  Lab 05/19/18 1952 05/20/18 0322 05/21/18 0352  NA 133* 136 141  K 3.9 4.1 3.4*  CL 97* 102 107  CO2 22 22 25   GLUCOSE 146* 142* 94  BUN 13 11 11   CREATININE 0.88 0.76 0.73  CALCIUM 8.6* 8.0* 7.3*   GFR: Estimated Creatinine Clearance: 36.2 mL/min (by C-G formula based on SCr of 0.73 mg/dL). Liver Function Tests: Recent Labs  Lab 05/19/18 1952  AST 26  ALT 18  ALKPHOS 37*  BILITOT 0.7  PROT 6.6  ALBUMIN 4.0   Recent Labs  Lab 05/19/18 1952  LIPASE 22   No results for input(s): AMMONIA in the last 168 hours. Coagulation Profile: No results for input(s): INR, PROTIME in the last 168 hours. Cardiac Enzymes: No results for input(s): CKTOTAL, CKMB, CKMBINDEX, TROPONINI in the last 168 hours. BNP (last 3 results) No results for input(s): PROBNP in the last 8760 hours. HbA1C: No results for input(s): HGBA1C in the last 72 hours. CBG: No results for input(s): GLUCAP in the last 168 hours. Lipid Profile: No results for input(s): CHOL, HDL, LDLCALC, TRIG, CHOLHDL, LDLDIRECT in the last 72 hours. Thyroid Function Tests: No results for input(s): TSH, T4TOTAL, FREET4, T3FREE, THYROIDAB in the last 72 hours. Anemia  Panel: No results for input(s): VITAMINB12, FOLATE, FERRITIN, TIBC, IRON, RETICCTPCT in the last 72 hours. Urine analysis:    Component Value Date/Time   COLORURINE YELLOW 05/19/2018 2021   APPEARANCEUR HAZY (A) 05/19/2018 2021   LABSPEC 1.017 05/19/2018 2021   PHURINE 6.0 05/19/2018 2021   GLUCOSEU NEGATIVE 05/19/2018 2021   HGBUR NEGATIVE 05/19/2018 2021   [place] NEGATIVE 05/19/2018 2021   KETONESUR 5 (A) 05/19/2018 2021   PROTEINUR NEGATIVE 05/19/2018 2021   NITRITE NEGATIVE 05/19/2018 2021   LEUKOCYTESUR NEGATIVE 05/19/2018 2021   Sepsis Labs: @LABRCNTIP (procalcitonin:4,lacticidven:4)  )No results found for this or any previous visit (from the past 240 hour(s)).    Radiology Studies: Ct Abdomen Pelvis W Contrast  Result Date: 05/19/2018 CLINICAL DATA:  Acute abdominal pain. Upper abdominal pain and dry heaves. EXAM: CT ABDOMEN AND PELVIS WITH CONTRAST TECHNIQUE: Multidetector CT imaging of the abdomen and pelvis was performed using the standard protocol following bolus administration of intravenous contrast. CONTRAST:  145mL OMNIPAQUE IOHEXOL 300 MG/ML  SOLN COMPARISON:  No prior abdominal imaging. FINDINGS: Lower chest: Large hiatal hernia, adjacent compressive  atelectasis in the left lower lobe. Tail the pancreas also extends into the hernia defect with tail in the lower thorax. Heart is normal in size. Hepatobiliary: Scattered cysts in the liver, largest measuring 3.5 cm centrally. No suspicious lesion. Gallbladder physiologically distended, no calcified stone. No biliary dilatation. Pancreas: No ductal dilatation or inflammation. Distal body and tail the pancreas extend into the lower thorax through large esophageal/hiatal hernia diaphragmatic defect. Spleen: Splenic cleft inferiorly. Normal in size without focal abnormality. Adrenals/Urinary Tract: Adrenal glands are grossly normal, partially obscured due to distorted anatomy secondary to severe scoliosis. No hydronephrosis  or perinephric edema. Homogeneous renal enhancement with symmetric excretion on delayed phase imaging. Urinary bladder is physiologically distended without wall thickening. Stomach/Bowel: Large hiatal hernia with greater than 50% of the stomach being intrathoracic. No evidence of gastric outlet obstruction. Diffusely fluid-filled dilated small bowel in the central abdomen with transition in the right central abdomen, image 37 series 3 and image 20 series 6. transitional cars in area of circumferential small bowel wall thickening with questionable small bowel small bowel intussusception. There is associated mucosal enhancement and mesenteric edema. The more distal small bowel appears decompressed. Possible bowel wall thickening of pelvic small bowel which may be reactive secondary to free fluid, but nonspecific. Upper normal appendix measuring 7 mm with mild appendiceal wall thickening but no intraluminal fluid. Mild adjacent periappendiceal stranding. Small volume of stool in the proximal colon, the more distal colon is decompressed, presence of intra-abdominal ascites partially limiting evaluation. Distal colonic diverticulosis without diverticulitis. Vascular/Lymphatic: Aortic atherosclerosis and tortuosity. No aneurysm. Enlarged mesenteric lymph node in the right lower quadrant measuring 12 mm for example images 40 6-47 series 3 versus small mesenteric mass. There are associated punctate calcifications. Paucity of intra-abdominal fat partially limits evaluation for adenopathy. Reproductive: Slight soft tissue prominence of the cervix, likely incidental. No gross adnexal mass. Other: Small to moderate volume abdominopelvic ascites, most prominent in the pelvis in the region of dilated small bowel. No free air. Musculoskeletal: Severe S-shaped scoliotic curvature of the spine, distorting normal anatomy. Associated multilevel degenerative change. No focal bone lesion. IMPRESSION: 1. Findings suspicious for early  small bowel obstruction with transition point in the right central abdomen. At the site of transition is circumferential small bowel wall thickening with questionable small bowel intussusception. There is associated mesenteric edema and small to moderate volume abdominopelvic ascites. 2. Enlarged mesenteric lymph node in the right lower quadrant measuring 12 mm versus small mesenteric mass with calcifications. In conjunction with small-bowel findings, findings raise possibility of carcinoid. Alternative small bowel thickening considerations include infectious or inflammatory enteritis. 3. Borderline appendix with wall thickening, but no disproportionate periappendiceal inflammatory changes. Suspect findings are reactive due to regional bowel inflammation rather than appendicitis. 4. Large hiatal hernia with greater than 50% of the stomach being intrathoracic. 5. Colonic diverticulosis without diverticulitis. Aortic Atherosclerosis (ICD10-I70.0). Electronically Signed   By: Keith Rake M.D.   On: 05/19/2018 22:18   Dg Abd Portable 1v-small Bowel Obstruction Protocol-initial, 8 Hr Delay  Result Date: 05/20/2018 CLINICAL DATA:  History of small bowel dilatation, 8 hour follow-up examination following contrast administration. EXAM: PORTABLE ABDOMEN - 1 VIEW COMPARISON:  Film from earlier in the same day. FINDINGS: Nasogastric catheter has been common off the film. The administered contrast material has passed through the small bowel into the colon. A minimal amount of residual contrast within the small bowel is seen. Minimally prominent loops are noted although the previously seen dilatation has predominately resolved. IMPRESSION: Administered  contrast has passed almost completely into the colon. Electronically Signed   By: Inez Catalina M.D.   On: 05/20/2018 21:43   Dg Abd Portable 1 View  Result Date: 05/20/2018 CLINICAL DATA:  Nasogastric tube placement. EXAM: PORTABLE ABDOMEN - 1 VIEW COMPARISON:  CT  abdomen and pelvis May 19, 2018 FINDINGS: Nasogastric tube looped in large hiatal hernia. A few loops of gas distended bowel in the abdomen corresponding to known obstruction. Contrast in the urinary bladder. Severe upper lumbar levoscoliosis. IMPRESSION: 1. Nasogastric tube tip in large hiatal hernia. 2. A few loops of gas distended bowel consistent with known bowel obstruction. Electronically Signed   By: Elon Alas M.D.   On: 05/20/2018 01:43     Scheduled Meds: . ALPRAZolam  0.5 mg Oral QHS  . enoxaparin (LOVENOX) injection  40 mg Subcutaneous Q24H  . famotidine  40 mg Oral QAC supper  . sodium chloride flush  3 mL Intravenous Q12H   Continuous Infusions: . sodium chloride 75 mL/hr at 05/21/18 0105     LOS: 1 day   Time Spent in minutes   30 minutes  Monisha Siebel D.O. on 05/21/2018 at 12:17 PM  Between 7am to 7pm - Please see pager noted on [email or web address]  After 7pm go to [email or web address]  And look for the night coverage person covering for me after hours  [facility] Office  [phone]

## 2018-05-21 NOTE — Discharge Instructions (Signed)
Low-Fiber Diet (for about 7 days after hospitalization) Fiber is found in fruits, vegetables, and whole grains. A low-fiber diet restricts fibrous foods that are not digested in the small intestine. A diet . Low-fiber diets may be used to:  Promote healing and rest the bowel during intestinal flare-ups.  Prevent blockage of a partially obstructed or narrowed gastrointestinal tract.  Reduce fecal weight and volume.  Slow the movement of feces.  You may be on a low-fiber diet as a transitional diet following surgery, after an injury (trauma), or because of a short (acute) or lifelong (chronic) illness. Your health care provider will determine the length of time you need to stay on this diet. What do I need to know about a low-fiber diet? Always check the fiber content on the packaging's Nutrition Facts label, especially on foods from the grains list. Ask your dietitian if you have questions about specific foods that are related to your condition, especially if the food is not listed below. In general, a low-fiber food will have less than 2 g of fiber. What foods can I eat? Grains All breads and crackers made with white flour. Sweet rolls, doughnuts, waffles, pancakes, [place] toast, bagels. Pretzels, Melba toast, zwieback. Well-cooked cereals, such as cornmeal, farina, or cream cereals. Dry cereals that do not contain whole grains, fruit, or nuts, such as refined corn, wheat, rice, and oat cereals. Potatoes prepared any way without skins, plain pastas and noodles, refined white rice. Use white flour for baking and making sauces. Use allowed list of grains for casseroles, dumplings, and puddings. Vegetables Strained tomato and vegetable juices. Fresh lettuce, cucumber, spinach. Well-cooked (no skin or pulp) or canned vegetables, such as asparagus, bean sprouts, beets, carrots, green beans, mushrooms, potatoes, pumpkin, spinach, yellow squash, tomato sauce/puree, turnips, yams, and zucchini. Keep  servings limited to  cup. Fruits All fruit juices except prune juice. Cooked or canned fruits without skin and seeds, such as applesauce, apricots, cherries, fruit cocktail, grapefruit, grapes, mandarin oranges, melons, peaches, pears, pineapple, and plums. Fresh fruits without skin, such as apricots, avocados, bananas, melons, pineapple, nectarines, and peaches. Keep servings limited to  cup or 1 piece. Meat and Other Protein Sources Ground or well-cooked tender beef, ham, veal, lamb, pork, or poultry. Eggs, plain cheese. Fish, oysters, shrimp, lobster, and other seafood. Liver, organ meats. Smooth nut butters. Dairy All milk products and alternative dairy substitutes, such as soy, rice, almond, and coconut, not containing added whole nuts, seeds, or added fruit. Beverages Decaf coffee, fruit, and vegetable juices or smoothies (small amounts, with no pulp or skins, and with fruits from allowed list), sports drinks, herbal tea. Condiments Ketchup, mustard, vinegar, cream sauce, cheese sauce, cocoa powder. Spices in moderation, such as allspice, basil, bay leaves, celery powder or leaves, cinnamon, cumin powder, curry powder, ginger, mace, marjoram, onion or garlic powder, oregano, paprika, parsley flakes, ground pepper, rosemary, sage, savory, tarragon, thyme, and turmeric. Sweets and Desserts Plain cakes and cookies, pie made with allowed fruit, pudding, custard, cream pie. Gelatin, fruit, ice, sherbet, frozen ice pops. Ice cream, ice milk without nuts. Plain hard candy, honey, jelly, molasses, syrup, sugar, chocolate syrup, gumdrops, marshmallows. Limit overall sugar intake. Fats and Oil Margarine, butter, cream, mayonnaise, salad oils, plain salad dressings made from allowed foods. Choose healthy fats such as olive oil, canola oil, and omega-3 fatty acids (such as found in salmon or tuna) when possible. Other Bouillon, broth, or cream soups made from allowed foods. Any strained soup. Casseroles  or mixed  dishes made with allowed foods. The items listed above may not be a complete list of recommended foods or beverages. Contact your dietitian for more options. What foods are not recommended? Grains All whole wheat and whole grain breads and crackers. Multigrains, rye, bran seeds, nuts, or coconut. Cereals containing whole grains, multigrains, bran, coconut, nuts, raisins. Cooked or dry oatmeal, steel-cut oats. Coarse wheat cereals, granola. Cereals advertised as high fiber. Potato skins. Whole grain pasta, wild or brown rice. Popcorn. Coconut flour. Bran, buckwheat, corn bread, multigrains, rye, wheat germ. Vegetables Fresh, cooked or canned vegetables, such as artichokes, asparagus, beet greens, broccoli, Brussels sprouts, cabbage, celery, cauliflower, corn, eggplant, kale, legumes or beans, okra, peas, and tomatoes. Avoid large servings of any vegetables, especially raw vegetables. Fruits Fresh fruits, such as apples with or without skin, berries, cherries, figs, grapes, grapefruit, guavas, kiwis, mangoes, oranges, papayas, pears, persimmons, pineapple, and pomegranate. Prune juice and juices with pulp, stewed or dried prunes. Dried fruits, dates, raisins. Fruit seeds or skins. Avoid large servings of all fresh fruits. Meats and Other Protein Sources Tough, fibrous meats with gristle. Chunky nut butter. Cheese made with seeds, nuts, or other foods not recommended. Nuts, seeds, legumes (beans, including baked beans), dried peas, beans, lentils. Dairy Yogurt or cheese that contains nuts, seeds, or added fruit. Beverages Fruit juices with high pulp, prune juice. Caffeinated coffee and teas. Condiments Coconut, maple syrup, pickles, olives. Sweets and Desserts Desserts, cookies, or candies that contain nuts or coconut, chunky peanut butter, dried fruits. Jams, preserves with seeds, marmalade. Large amounts of sugar and sweets. Any other dessert made with fruits from the not recommended  list. Other Soups made from vegetables that are not recommended or that contain other foods not recommended. The items listed above may not be a complete list of foods and beverages to avoid. Contact your dietitian for more information. This information is not intended to replace advice given to you by your health care provider. Make sure you discuss any questions you have with your health care provider. Document Released: 11/11/2001 Document Revised: 10/28/2015 Document Reviewed: 04/14/2013 Elsevier Interactive Patient Education  2017 [employer].

## 2018-05-21 NOTE — Plan of Care (Signed)
  Problem: Clinical Measurements: Goal: Will remain free from infection Outcome: Progressing   Problem: Activity: Goal: Risk for activity intolerance will decrease Outcome: Progressing   Problem: Nutrition: Goal: Adequate nutrition will be maintained Outcome: Progressing   Problem: Elimination: Goal: Will not experience complications related to bowel motility Outcome: Progressing   Problem: Pain Managment: Goal: General experience of comfort will improve Outcome: Progressing   Problem: Safety: Goal: Ability to remain free from injury will improve Outcome: Progressing   Problem: Skin Integrity: Goal: Risk for impaired skin integrity will decrease Outcome: Progressing

## 2018-05-22 DIAGNOSIS — Z0189 Encounter for other specified special examinations: Secondary | ICD-10-CM

## 2018-05-22 MED ORDER — DOCUSATE SODIUM 100 MG PO CAPS: 100.0000 mg | ORAL_CAPSULE | Freq: Every day | ORAL | 2 refills | Status: DC

## 2018-05-22 MED ORDER — MENTHOL 3 MG MT LOZG
1.0000 | LOZENGE | OROMUCOSAL | Status: DC | PRN
  Administered 2018-05-22: 3 mg via ORAL

## 2018-05-22 MED ORDER — POLYETHYLENE GLYCOL 3350 17 G PO PACK: 17.0000 g | PACK | Freq: Every day | ORAL | 0 refills | Status: AC

## 2018-05-22 MED ORDER — POLYETHYLENE GLYCOL 3350 17 G PO PACK
17.0000 g | PACK | Freq: Every day | ORAL | Status: DC
  Filled 2018-05-22: qty 1

## 2018-05-22 MED ORDER — PHENOL 1.4 % MT LIQD: 1.0000 | OROMUCOSAL | Status: DC | PRN

## 2018-05-22 NOTE — Final Consult Note (Signed)
Consultant Final Sign-Off Note    Assessment/Final recommendations  Jennifer Kirk is a 82 y.o. female followed by me for SBO. - AXR 12/16 showed contrast in the colon - having BM's and flatus, tolerating soft diet   Wound care (if applicable):    Diet at discharge: low fiber for 3-5 days then regular diet   Activity at discharge: per primary team   Follow-up appointment:  None needed   Pending results:  Unresulted Labs (From admission, onward)   None       Medication recommendations: stool softener and miralax to avoid constipation while taking iron   Other recommendations:    Thank you for allowing [name] to participate in the care of your patient!  Please consult [name] again if you have further needs for your patient.  Kalman Drape 05/22/2018 9:18 AM    Subjective   CC: SBO  Pt states BM's and flatus. Tolerating diet. No issues overnight. No nausea or vomiting. Pt states she takes a stool softener but not miralax. She still believes this was caused by her iron. Son at bedside.   Objective  Vital signs in last 24 hours: Temp:  [97.5 F (36.4 C)-100.4 F (38 C)] 97.5 F (36.4 C) (12/18 0437) Pulse Rate:  [77-79] 77 (12/18 0437) Resp:  [16-18] 16 (12/18 0437) BP: (119-120)/(61-73) 119/73 (12/18 0437) SpO2:  [92 %-96 %] 96 % (12/18 0437)  PE: General: well appearing, NAD, pleasant Lungs: rate and effort normal GI: +BS, ND, soft, mild TTP in LUQ without guarding, no peritonitis  Skin: Warm and dry  Pertinent labs and Studies: Recent Labs    05/19/18 1952 05/20/18 0322 05/21/18 0352  WBC 12.6* 14.5* 8.0  HGB 13.3 12.5 10.6*  HCT 40.8 37.7 32.6*   BMET Recent Labs    05/20/18 0322 05/21/18 0352  NA 136 141  K 4.1 3.4*  CL 102 107  CO2 22 25  GLUCOSE 142* 94  BUN 11 11  CREATININE 0.76 0.73  CALCIUM 8.0* 7.3*   No results for input(s): LABURIN in the last 72 hours. No results found for this or any previous visit.  Imaging: No results  found.

## 2018-05-22 NOTE — Progress Notes (Signed)
Pt feeling a lot better. Tolerating soft diet. Had a bowel movement this morning. Discharge instructions given to pt and her son.  Discharged to home accompanied by son.

## 2018-05-22 NOTE — Discharge Summary (Signed)
Physician Discharge Summary  Jennifer Kirk TSV:[id number] DOB: Jul 01, 1935 DOA: 05/19/2018  PCP: [name], MD  Admit date: 05/19/2018 Discharge date: 05/22/2018  Admitted From:home Disposition: home Recommendations for Outpatient Follow-up:  1. Follow up with PCP in 1-2 weeks 2. Please obtain BMP/CBC in one week  Home Health none Equipment/Devices none Discharge Condition stable CODE STATUS full Diet recommendation:soft diet  Brief/Interim Summary:82 year old woman with medical problems including anxiety, osteoporosis, scoliosis, hypertension, history of iron deficiency anemia discovered in September 2019 without clear cause presenting with abdominal pain nausea and vomiting.  History is obtained via patient report as well as son at the bedside. She reports 1 day prior to admission developing crampy abdominal pain located periumbilical, severe in intensity. Associated with nausea and vomiting, mostly dry heaves. Reports last bowel movement was May 18, 2018 at 11 PM. Does report flatus the following day. No aggravating or alleviating symptoms. She reports at times using some ambulatory assist device on account of her scoliosis, but is independent in her ADLs and IADLs.  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 Lives near [place]. Denies any chest pain, shortness of breath, fevers.No recent viral illnesses.   Discharge Diagnoses:  Active Problems:   SBO (small bowel obstruction) (HCC)   Essential hypertension  Partial small bowel obstruction -Patient presenting with 1 day onset of abdominal pain, nausea and vomiting -CT abdomen pelvis is suggestive of early small bowel obstruction -General surgery consulted recommended NG tube.  Patient had positive bowel sounds and flatus and bowel movements on the day of discharge.-Given small bowel protocol on  05/20/2016, with x-ray showing contrast throughout the colon.  Patient able to tolerate a soft diet on the day of discharge.  Leukocytosis -Resolved, secondary to the above  Anxiety -Continue home xanax  Essential hypertension does not take any medicines at home blood pressure have been stable.  Iron deficiency anemia -hemoglobin down to 10.6.   -Unfortunately unable to deduce baseline given previous labs in epic system.  Suspect drop in in hemoglobin likely secondary to a dilutional component as patient was receiving IV fluids. -has been taking iron supplementation and feel this led to her SBO -Restart iron with MiraLAX and Colace.  Osteoporosis -Patient uses denosumab      Estimated body mass index is 20.27 kg/m as calculated from the following:   Height as of this encounter: 4\' 10"  (1.473 m).   Weight as of this encounter: 44 kg.  Discharge Instructions  Discharge Instructions    Call MD for:  difficulty breathing, headache or visual disturbances   Complete by:  As directed    Call MD for:  persistant dizziness or light-headedness   Complete by:  As directed    Call MD for:  persistant nausea and vomiting   Complete by:  As directed    Diet - low sodium heart healthy   Complete by:  As directed    Increase activity slowly   Complete by:  As directed      Allergies as of 05/22/2018      Reactions   Codeine Nausea And Vomiting   Penicillins Rash   Has patient had a PCN reaction causing immediate rash, facial/tongue/throat swelling, SOB or lightheadedness with hypotension: Yes Has patient had a PCN reaction causing severe rash involving mucus membranes or skin necrosis: No Has patient had a PCN reaction that required hospitalization: No Has patient had a PCN  reaction occurring within the last 10 years: No If all of the above answers are "NO", then may proceed with Cephalosporin use.   Tramadol Nausea Only   Intolerance      Medication List    TAKE these  medications   acetaminophen 500 MG tablet Commonly known as:  TYLENOL Take 1,000 mg by mouth every 6 (six) hours as needed for headache.   ALIGN PO Take 1 tablet by mouth daily.   ALPRAZolam 1 MG tablet Commonly known as:  XANAX Take 0.5 mg by mouth at bedtime.   BIOTIN PO Take 1 capsule by mouth daily.   CALCIUM-D PO Take 2 tablets by mouth 2 (two) times daily.   denosumab 60 MG/ML Sosy injection Commonly known as:  PROLIA Inject 60 mg into the skin every 6 (six) months.   docusate sodium 100 MG capsule Commonly known as:  COLACE Take 1 capsule (100 mg total) by mouth daily.   famotidine 40 MG tablet Commonly known as:  PEPCID Take 40 mg by mouth daily before supper.   ferrous sulfate 325 (65 FE) MG tablet Take 1 tablet (325 mg total) by mouth 2 (two) times daily with a meal.   GINGER PO Take 1 capsule by mouth daily.   hydrocortisone cream 1 % Apply 1 application topically daily as needed for itching (wound care).   multivitamin with minerals Tabs tablet Take 1 tablet by mouth daily.   OVER THE COUNTER MEDICATION Take 85 mg by mouth See admin instructions. Hema-Plex - 85 mg amino chelated iron with 300 mg vitamin C, B complex and minerals - take one tablet by mouth three times weekly (Monday, Wednesday, Friday)   polyethylene glycol packet Commonly known as:  MIRALAX / GLYCOLAX Take 17 g by mouth daily.   SYSTANE OP Place 2 drops into both eyes 2 (two) times daily.   vitamin C 500 MG tablet Commonly known as:  ASCORBIC ACID Take 500 mg by mouth daily.   VITAMIN D-VITAMIN K PO Take 1 tablet by mouth daily.      Follow-up Information    [name], MD Follow up.   Specialty:  Internal Medicine Contact information: [address]  [place] [id number] [phone]          Allergies  Allergen Reactions  . Codeine Nausea And Vomiting  . Penicillins Rash    Has patient had a PCN reaction causing immediate rash,  facial/tongue/throat swelling, SOB or lightheadedness with hypotension: Yes Has patient had a PCN reaction causing severe rash involving mucus membranes or skin necrosis: No Has patient had a PCN reaction that required hospitalization: No Has patient had a PCN reaction occurring within the last 10 years: No If all of the above answers are "NO", then may proceed with Cephalosporin use.   . Tramadol Nausea Only    Intolerance    Consultations: General surgery   Procedures/Studies: Ct Abdomen Pelvis W Contrast  Result Date: 05/19/2018 CLINICAL DATA:  Acute abdominal pain. Upper abdominal pain and dry heaves. EXAM: CT ABDOMEN AND PELVIS WITH CONTRAST TECHNIQUE: Multidetector CT imaging of the abdomen and pelvis was performed using the standard protocol following bolus administration of intravenous contrast. CONTRAST:  183mL OMNIPAQUE IOHEXOL 300 MG/ML  SOLN COMPARISON:  No prior abdominal imaging. FINDINGS: Lower chest: Large hiatal hernia, adjacent compressive atelectasis in the left lower lobe. Tail the pancreas also extends into the hernia defect with tail in the lower thorax. Heart is normal in size. Hepatobiliary: Scattered cysts  in the liver, largest measuring 3.5 cm centrally. No suspicious lesion. Gallbladder physiologically distended, no calcified stone. No biliary dilatation. Pancreas: No ductal dilatation or inflammation. Distal body and tail the pancreas extend into the lower thorax through large esophageal/hiatal hernia diaphragmatic defect. Spleen: Splenic cleft inferiorly. Normal in size without focal abnormality. Adrenals/Urinary Tract: Adrenal glands are grossly normal, partially obscured due to distorted anatomy secondary to severe scoliosis. No hydronephrosis or perinephric edema. Homogeneous renal enhancement with symmetric excretion on delayed phase imaging. Urinary bladder is physiologically distended without wall thickening. Stomach/Bowel: Large hiatal hernia with greater than 50%  of the stomach being intrathoracic. No evidence of gastric outlet obstruction. Diffusely fluid-filled dilated small bowel in the central abdomen with transition in the right central abdomen, image 37 series 3 and image 20 series 6. transitional cars in area of circumferential small bowel wall thickening with questionable small bowel small bowel intussusception. There is associated mucosal enhancement and mesenteric edema. The more distal small bowel appears decompressed. Possible bowel wall thickening of pelvic small bowel which may be reactive secondary to free fluid, but nonspecific. Upper normal appendix measuring 7 mm with mild appendiceal wall thickening but no intraluminal fluid. Mild adjacent periappendiceal stranding. Small volume of stool in the proximal colon, the more distal colon is decompressed, presence of intra-abdominal ascites partially limiting evaluation. Distal colonic diverticulosis without diverticulitis. Vascular/Lymphatic: Aortic atherosclerosis and tortuosity. No aneurysm. Enlarged mesenteric lymph node in the right lower quadrant measuring 12 mm for example images 40 6-47 series 3 versus small mesenteric mass. There are associated punctate calcifications. Paucity of intra-abdominal fat partially limits evaluation for adenopathy. Reproductive: Slight soft tissue prominence of the cervix, likely incidental. No gross adnexal mass. Other: Small to moderate volume abdominopelvic ascites, most prominent in the pelvis in the region of dilated small bowel. No free air. Musculoskeletal: Severe S-shaped scoliotic curvature of the spine, distorting normal anatomy. Associated multilevel degenerative change. No focal bone lesion. IMPRESSION: 1. Findings suspicious for early small bowel obstruction with transition point in the right central abdomen. At the site of transition is circumferential small bowel wall thickening with questionable small bowel intussusception. There is associated mesenteric edema  and small to moderate volume abdominopelvic ascites. 2. Enlarged mesenteric lymph node in the right lower quadrant measuring 12 mm versus small mesenteric mass with calcifications. In conjunction with small-bowel findings, findings raise possibility of carcinoid. Alternative small bowel thickening considerations include infectious or inflammatory enteritis. 3. Borderline appendix with wall thickening, but no disproportionate periappendiceal inflammatory changes. Suspect findings are reactive due to regional bowel inflammation rather than appendicitis. 4. Large hiatal hernia with greater than 50% of the stomach being intrathoracic. 5. Colonic diverticulosis without diverticulitis. Aortic Atherosclerosis (ICD10-I70.0). Electronically Signed   By: Keith Rake M.D.   On: 05/19/2018 22:18   Dg Abd Portable 1v-small Bowel Obstruction Protocol-initial, 8 Hr Delay  Result Date: 05/20/2018 CLINICAL DATA:  History of small bowel dilatation, 8 hour follow-up examination following contrast administration. EXAM: PORTABLE ABDOMEN - 1 VIEW COMPARISON:  Film from earlier in the same day. FINDINGS: Nasogastric catheter has been common off the film. The administered contrast material has passed through the small bowel into the colon. A minimal amount of residual contrast within the small bowel is seen. Minimally prominent loops are noted although the previously seen dilatation has predominately resolved. IMPRESSION: Administered contrast has passed almost completely into the colon. Electronically Signed   By: Inez Catalina M.D.   On: 05/20/2018 21:43   Dg Abd Portable 1 View  Result Date: 05/20/2018 CLINICAL DATA:  Nasogastric tube placement. EXAM: PORTABLE ABDOMEN - 1 VIEW COMPARISON:  CT abdomen and pelvis May 19, 2018 FINDINGS: Nasogastric tube looped in large hiatal hernia. A few loops of gas distended bowel in the abdomen corresponding to known obstruction. Contrast in the urinary bladder. Severe upper lumbar  levoscoliosis. IMPRESSION: 1. Nasogastric tube tip in large hiatal hernia. 2. A few loops of gas distended bowel consistent with known bowel obstruction. Electronically Signed   By: Elon Alas M.D.   On: 05/20/2018 01:43    (Echo, Carotid, EGD, Colonoscopy, ERCP)    Subjective: Sitting up in chair eating toast soft diet Oats son by the bedside she reports having a bowel movement and flatus last night and this morning.  Discharge Exam: Vitals:   05/21/18 2121 05/22/18 0437  BP:  119/73  Pulse:  77  Resp:  16  Temp: 98.7 F (37.1 C) (!) 97.5 F (36.4 C)  SpO2:  96%   Vitals:   05/21/18 0524 05/21/18 2002 05/21/18 2121 05/22/18 0437  BP: 136/68 120/61  119/73  Pulse: 95 79  77  Resp: 15 18  16   Temp: 98.2 F (36.8 C) (!) 100.4 F (38 C) 98.7 F (37.1 C) (!) 97.5 F (36.4 C)  TempSrc: Oral Oral Oral Oral  SpO2: 94% 92%  96%  Weight:      Height:        General: Pt is alert, awake, not in acute distress Cardiovascular: RRR, S1/S2 +, no rubs, no gallops Respiratory: CTA bilaterally, no wheezing, no rhonchi Abdominal: Soft, NT, ND, bowel sounds + Extremities: no edema, no cyanosis    The results of significant diagnostics from this hospitalization (including imaging, microbiology, ancillary and laboratory) are listed below for reference.     Microbiology: No results found for this or any previous visit (from the past 240 hour(s)).   Labs: BNP (last 3 results) No results for input(s): BNP in the last 8760 hours. Basic Metabolic Panel: Recent Labs  Lab 05/19/18 1952 05/20/18 0322 05/21/18 0352  NA 133* 136 141  K 3.9 4.1 3.4*  CL 97* 102 107  CO2 22 22 25   GLUCOSE 146* 142* 94  BUN 13 11 11   CREATININE 0.88 0.76 0.73  CALCIUM 8.6* 8.0* 7.3*   Liver Function Tests: Recent Labs  Lab 05/19/18 1952  AST 26  ALT 18  ALKPHOS 37*  BILITOT 0.7  PROT 6.6  ALBUMIN 4.0   Recent Labs  Lab 05/19/18 1952  LIPASE 22   No results for input(s): AMMONIA  in the last 168 hours. CBC: Recent Labs  Lab 05/19/18 1952 05/20/18 0322 05/21/18 0352  WBC 12.6* 14.5* 8.0  HGB 13.3 12.5 10.6*  HCT 40.8 37.7 32.6*  MCV 95.1 94.0 96.4  PLT 329 309 230   Cardiac Enzymes: No results for input(s): CKTOTAL, CKMB, CKMBINDEX, TROPONINI in the last 168 hours. BNP: Invalid input(s): POCBNP CBG: No results for input(s): GLUCAP in the last 168 hours. D-Dimer No results for input(s): DDIMER in the last 72 hours. Hgb A1c No results for input(s): HGBA1C in the last 72 hours. Lipid Profile No results for input(s): CHOL, HDL, LDLCALC, TRIG, CHOLHDL, LDLDIRECT in the last 72 hours. Thyroid function studies No results for input(s): TSH, T4TOTAL, T3FREE, THYROIDAB in the last 72 hours.  Invalid input(s): FREET3 Anemia work up No results for input(s): VITAMINB12, FOLATE, FERRITIN, TIBC, IRON, RETICCTPCT in the last 72 hours. Urinalysis    Component Value Date/Time  [place] YELLOW 05/19/2018 2021   APPEARANCEUR HAZY (A) 05/19/2018 2021   LABSPEC 1.017 05/19/2018 2021   PHURINE 6.0 05/19/2018 2021   GLUCOSEU NEGATIVE 05/19/2018 2021   HGBUR NEGATIVE 05/19/2018 [place] NEGATIVE 05/19/2018 2021   KETONESUR 5 (A) 05/19/2018 2021   PROTEINUR NEGATIVE 05/19/2018 2021   NITRITE NEGATIVE 05/19/2018 2021   LEUKOCYTESUR NEGATIVE 05/19/2018 2021   Sepsis Labs Invalid input(s): PROCALCITONIN,  WBC,  LACTICIDVEN Microbiology No results found for this or any previous visit (from the past 240 hour(s)).   Time coordinating discharge: 35 minutes  SIGNED:   Georgette Shell, MD  [facility] 05/22/2018, 9:40 AM  If 7PM-7AM, please contact night-coverage [email or web address] Password [email or web address]

## 2018-06-06 DIAGNOSIS — Z79899 Other long term (current) drug therapy: Secondary | ICD-10-CM | POA: Diagnosis not present

## 2018-06-06 DIAGNOSIS — M4125 Other idiopathic scoliosis, thoracolumbar region: Secondary | ICD-10-CM | POA: Diagnosis not present

## 2018-06-06 DIAGNOSIS — D500 Iron deficiency anemia secondary to blood loss (chronic): Secondary | ICD-10-CM | POA: Diagnosis not present

## 2018-06-06 DIAGNOSIS — R1084 Generalized abdominal pain: Secondary | ICD-10-CM | POA: Diagnosis not present

## 2018-06-10 DIAGNOSIS — M4124 Other idiopathic scoliosis, thoracic region: Secondary | ICD-10-CM | POA: Diagnosis not present

## 2018-06-10 DIAGNOSIS — M546 Pain in thoracic spine: Secondary | ICD-10-CM | POA: Diagnosis not present

## 2018-06-10 DIAGNOSIS — M4004 Postural kyphosis, thoracic region: Secondary | ICD-10-CM | POA: Diagnosis not present

## 2018-06-19 DIAGNOSIS — M546 Pain in thoracic spine: Secondary | ICD-10-CM | POA: Diagnosis not present

## 2018-06-19 DIAGNOSIS — M4004 Postural kyphosis, thoracic region: Secondary | ICD-10-CM | POA: Diagnosis not present

## 2018-06-19 DIAGNOSIS — M4124 Other idiopathic scoliosis, thoracic region: Secondary | ICD-10-CM | POA: Diagnosis not present

## 2018-06-26 DIAGNOSIS — M4124 Other idiopathic scoliosis, thoracic region: Secondary | ICD-10-CM | POA: Diagnosis not present

## 2018-06-26 DIAGNOSIS — M4004 Postural kyphosis, thoracic region: Secondary | ICD-10-CM | POA: Diagnosis not present

## 2018-06-26 DIAGNOSIS — M546 Pain in thoracic spine: Secondary | ICD-10-CM | POA: Diagnosis not present

## 2018-07-02 ENCOUNTER — Telehealth: Payer: Self-pay | Admitting: Internal Medicine

## 2018-07-02 NOTE — Telephone Encounter (Signed)
Patient reports that she had an episode last night of vomiting and dry heaves.  She was worried she had a repeat small bowel obstruction.  She is feeling much better today.  She is advised to stay on liquids for the next 24-48 hours then slowly add back solid foods.  She will call back for any additional questions or concerns.

## 2018-07-02 NOTE — Telephone Encounter (Signed)
Spoke with pt she stated that she needed to speak with the nurse about not having another episode of intestinal l blockage.

## 2018-07-10 DIAGNOSIS — M546 Pain in thoracic spine: Secondary | ICD-10-CM | POA: Diagnosis not present

## 2018-07-10 DIAGNOSIS — M4124 Other idiopathic scoliosis, thoracic region: Secondary | ICD-10-CM | POA: Diagnosis not present

## 2018-07-10 DIAGNOSIS — M4004 Postural kyphosis, thoracic region: Secondary | ICD-10-CM | POA: Diagnosis not present

## 2018-07-23 ENCOUNTER — Other Ambulatory Visit (INDEPENDENT_AMBULATORY_CARE_PROVIDER_SITE_OTHER): Payer: Medicare Other

## 2018-07-23 ENCOUNTER — Encounter: Payer: Self-pay | Admitting: Internal Medicine

## 2018-07-23 ENCOUNTER — Encounter

## 2018-07-23 ENCOUNTER — Telehealth: Payer: Self-pay | Admitting: Internal Medicine

## 2018-07-23 ENCOUNTER — Ambulatory Visit (INDEPENDENT_AMBULATORY_CARE_PROVIDER_SITE_OTHER): Payer: Medicare Other | Admitting: Internal Medicine

## 2018-07-23 VITALS — BP 112/72 | HR 82 | Ht <= 58 in | Wt 96.0 lb

## 2018-07-23 DIAGNOSIS — K56609 Unspecified intestinal obstruction, unspecified as to partial versus complete obstruction: Secondary | ICD-10-CM

## 2018-07-23 DIAGNOSIS — K449 Diaphragmatic hernia without obstruction or gangrene: Secondary | ICD-10-CM | POA: Diagnosis not present

## 2018-07-23 DIAGNOSIS — R9389 Abnormal findings on diagnostic imaging of other specified body structures: Secondary | ICD-10-CM | POA: Diagnosis not present

## 2018-07-23 DIAGNOSIS — K5730 Diverticulosis of large intestine without perforation or abscess without bleeding: Secondary | ICD-10-CM

## 2018-07-23 DIAGNOSIS — D508 Other iron deficiency anemias: Secondary | ICD-10-CM

## 2018-07-23 LAB — BUN: BUN: 19 mg/dL (ref 6–23)

## 2018-07-23 LAB — CREATININE, SERUM: Creatinine, Ser: 1.12 mg/dL (ref 0.40–1.20)

## 2018-07-23 NOTE — Patient Instructions (Addendum)
We have given you a low fiber diet to read today.   If you get an attack go to clear liquids, small amounts of soft foods, crackers are okay.   You have been scheduled for a CT scan of the abdomen and pelvis at [facility] ([address]---this is in the same building as Press photographer).   You are scheduled on ______________ at _______________. You should arrive 15 minutes prior to your appointment time for registration. Please follow the written instructions below on the day of your exam:  WARNING: IF YOU ARE ALLERGIC TO IODINE/X-RAY DYE, PLEASE NOTIFY RADIOLOGY IMMEDIATELY AT [phone]! YOU WILL BE GIVEN A 13 HOUR PREMEDICATION PREP.  1) Do not eat or drink anything after ______________ (4 hours prior to your test) 2) You have been given 2 bottles of oral contrast to drink. The solution may taste better if refrigerated, but do NOT add ice or any other liquid to this solution. Shake well before drinking.    Drink 1 bottle of contrast @ ____________ (2 hours prior to your exam)  Drink 1 bottle of contrast @ ____________ (1 hour prior to your exam)  You may take any medications as prescribed with a small amount of water, if necessary. If you take any of the following medications: METFORMIN, GLUCOPHAGE, GLUCOVANCE, AVANDAMET, RIOMET, FORTAMET, [employer] MET, JANUMET, GLUMETZA or METAGLIP, you MAY be asked to HOLD this medication 48 hours AFTER the exam.  The purpose of you drinking the oral contrast is to aid in the visualization of your intestinal tract. The contrast solution may cause some diarrhea. Depending on your individual set of symptoms, you may also receive an intravenous injection of x-ray contrast/dye. Plan on being at [facility] for 30 minutes or longer, depending on the type of exam you are having performed.  This test typically takes 30-45 minutes to complete.  If you have any questions regarding your exam or if you need to reschedule, you may call  the CT department at [phone] between the hours of 8:00 am and 5:00 pm, Monday-Friday.  ________________________________________________________________________      I appreciate the opportunity to care for you. [name], MD, [facility]

## 2018-07-23 NOTE — Telephone Encounter (Signed)
Pt requested an early afternoon appt for CT scan.

## 2018-07-23 NOTE — Progress Notes (Signed)
Normal so schedule CT as discussed

## 2018-07-23 NOTE — Progress Notes (Signed)
CT appointment made for 08/09/2018 at 11:30AM. Unable to reach patient today with this information. Will try her again in the morning.

## 2018-07-23 NOTE — Telephone Encounter (Signed)
Left message to call me back.

## 2018-07-23 NOTE — Progress Notes (Signed)
Jennifer Kirk 83 y.o. 08/10/1935 [id number]  Assessment & Plan:   Encounter Diagnoses  Name Primary?  . Small bowel obstruction ([place]) Yes  . Other iron deficiency anemia   . Large hiatal hernia   . Diverticulosis of colon with sigmoid stenosis     Low fiber diet 4-10 g/day Wine ok white better than red, 3-4 hrs before going to bed If gets attacks go to clear liquids, small amount of soft foods, crackers ok Water in the office we had decided on a conservative approach with as I reread this CT report I think it is reasonable to repeat a CT scan given the 12 mm lesion they thought could have been a carcinoid tumor.  I called her and she understands and we weill arrange F/U PCP re: any future iron tx  I appreciate the opportunity to care for this patient. CC: [name], MD   Subjective:   Chief Complaint: Follow-up after small bowel obstruction  HPI The patient is here for follow-up after she had a small bowel obstruction in December 2019.  She had been in the hospital before that with iron deficiency anemia where an EGD and a colonoscopy were notable for a very large hiatal hernia, and severe sigmoid diverticulosis with stenosis.  She had been on iron therapy that caused bad constipation.  I saw her in November about that.  She had been forcing a lot of fiber with supplements with fiber like Metamucil, and roughage to try to promote better defecation on iron therapy.  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  That was on December 15.  CT scan showed findings suspicious for early small bowel obstruction with a transition point in the right central abdomen.  She did have an enlarged mesenteric lymph node in the right lower quadrant measuring 12 mm versus a small mesenteric mass with  calcifications that raise the possibility of a carcinoid.  There was a question of small bowel intussusception as well at the site of the transition point. Wt Readings from Last 3 Encounters:  07/23/18 96 lb (43.5 kg)  05/20/18 97 lb (44 kg)  04/11/18 97 lb (44 kg)   She has been managing okay with 1 very transient episode since the hospitalization of bloating and distention.  Bowel movements are still small but a half dose of MiraLAX a day is working well.  She has been on a low fiber diet.  The day she had some problems she had eaten an orange and some coleslaw.  In general she has tried to restrict fiber.  She is not having any indigestion or heartburn type symptomatology that I can tell and she wonders if it is okay to have wine at night because somebody told her she should not have wine with her hiatal hernia.  She is currently off iron therapy and her most recent hemoglobin in January was normal. Allergies  Allergen Reactions  . Codeine Nausea And Vomiting  . Penicillins Rash    Has patient had a PCN reaction causing immediate rash, facial/tongue/throat swelling, SOB or lightheadedness with hypotension: Yes Has patient had a PCN reaction causing severe rash involving mucus membranes or skin necrosis: No Has patient had a PCN reaction that required hospitalization: No Has patient had a PCN reaction occurring within the  last 10 years: No If all of the above answers are "NO", then may proceed with Cephalosporin use.   . Tramadol Nausea Only    Intolerance   Current Meds  Medication Sig  . acetaminophen (TYLENOL) 500 MG tablet Take 1,000 mg by mouth every 6 (six) hours as needed for headache.  . ALPRAZolam (XANAX) 1 MG tablet Take 0.5 mg by mouth at bedtime.   Marland Kitchen BIOTIN PO Take 1 capsule by mouth daily.  . Calcium Carbonate-Vitamin D (CALCIUM-D PO) Take 2 tablets by mouth 2 (two) times daily.  Marland Kitchen denosumab (PROLIA) 60 MG/ML SOSY injection Inject 60 mg into the skin every 6 (six) months.  .  famotidine (PEPCID) 40 MG tablet Take 40 mg by mouth daily before supper.   . Ginger, Zingiber officinalis, (GINGER PO) Take 1 capsule by mouth daily.  . hydrocortisone cream 1 % Apply 1 application topically daily as needed for itching (wound care).  . Misc Natural Products (APPLE CIDER VINEGAR DIET PO) Take by mouth 2 (two) times daily. 1 tablespoon daily  . Multiple Vitamin (MULTIVITAMIN WITH MINERALS) TABS Take 1 tablet by mouth daily.  . NON FORMULARY Digest  Basic enzyme  1-2 w/meals  . Polyethyl Glycol-Propyl Glycol (SYSTANE OP) Place 2 drops into both eyes 2 (two) times daily.  . polyethylene glycol (MIRALAX / GLYCOLAX) packet Take 17 g by mouth daily.  . Probiotic Product (ALIGN PO) Take 1 tablet by mouth daily.  . vitamin C (ASCORBIC ACID) 500 MG tablet Take 500 mg by mouth daily.  Marland Kitchen VITAMIN D-VITAMIN K PO Take 1 tablet by mouth daily.   Past Medical History:  Diagnosis Date  . Anxiety   . Degenerative disc disease   . GERD (gastroesophageal reflux disease)   . Hiatal hernia 2010   GERD,esophageal dysmotility and paraesophageal hernia on esophogram  . Osteopetrosis    degenerative spine disease, lumbar  . Scoliosis    Past Surgical History:  Procedure Laterality Date  . BIOPSY  02/22/2018   Procedure: BIOPSY;  Surgeon: Thornton Park, MD;  Location: [place];  Service: Gastroenterology;;  . BREAST EXCISIONAL BIOPSY Bilateral 08/2002   path: fibrocystic changes.  sclerosing adenosis with microcalcification.  Marland Kitchen BREAST LUMPECTOMY    . COLONOSCOPY  10/2007   screening study.  Dr Owens Loffler.  small internal hemorrhoids, no polyps, no diverticulosis.    . COLONOSCOPY WITH PROPOFOL N/A 02/23/2018   Procedure: COLONOSCOPY WITH PROPOFOL;  Surgeon: [name], MD;  Location: [facility] ENDOSCOPY;  Service: Endoscopy;  Laterality: N/A;  . ESOPHAGOGASTRODUODENOSCOPY N/A 02/22/2018   Procedure: ESOPHAGOGASTRODUODENOSCOPY (EGD);  Surgeon: Thornton Park, MD;  Location: [place];  Service: Gastroenterology;  Laterality: N/A;   Social History   Social History Narrative   The patient is widowed   Is a never smoker and does drink occasional alcohol   family history includes Dementia in her father; Heart disease in her father; Stroke in her mother.   Review of Systems As per HPI  Objective:   Physical Exam BP 112/72   Pulse 82   Ht 4\' 10"  (1.473 m)   Wt 96 lb (43.5 kg)   BMI 20.06 kg/m  No acute distress

## 2018-07-23 NOTE — Telephone Encounter (Signed)
After patient left Dr Carlean Purl office visit today he decided to order a CT abdomen and pelvis with IV and oral contrast. She came back and had labs drawn and I have the CT set up for 08/09/2018 at 11:30AM, arrive at 11:15 AM. She picked up her contrast when she was here.

## 2018-07-24 NOTE — Telephone Encounter (Signed)
Patient informed of date/time and questions about prepping answered. She was very appreciative of Dr Celesta Aver time and concern.

## 2018-07-24 NOTE — Telephone Encounter (Signed)
Pt is returning your call regarding CT appt.

## 2018-07-29 DIAGNOSIS — M4004 Postural kyphosis, thoracic region: Secondary | ICD-10-CM | POA: Diagnosis not present

## 2018-07-29 DIAGNOSIS — M546 Pain in thoracic spine: Secondary | ICD-10-CM | POA: Diagnosis not present

## 2018-07-29 DIAGNOSIS — M4124 Other idiopathic scoliosis, thoracic region: Secondary | ICD-10-CM | POA: Diagnosis not present

## 2018-08-09 ENCOUNTER — Ambulatory Visit (INDEPENDENT_AMBULATORY_CARE_PROVIDER_SITE_OTHER)
Admission: RE | Admit: 2018-08-09 | Discharge: 2018-08-09 | Disposition: A | Discharge status: Home / Self Care | Payer: Medicare Other | Source: Ambulatory Visit | Attending: Internal Medicine | Admitting: Internal Medicine

## 2018-08-09 DIAGNOSIS — R9389 Abnormal findings on diagnostic imaging of other specified body structures: Secondary | ICD-10-CM

## 2018-08-09 DIAGNOSIS — K5730 Diverticulosis of large intestine without perforation or abscess without bleeding: Secondary | ICD-10-CM | POA: Diagnosis not present

## 2018-08-09 DIAGNOSIS — K56609 Unspecified intestinal obstruction, unspecified as to partial versus complete obstruction: Secondary | ICD-10-CM

## 2018-08-09 DIAGNOSIS — K449 Diaphragmatic hernia without obstruction or gangrene: Secondary | ICD-10-CM | POA: Diagnosis not present

## 2018-08-09 MED ORDER — IOHEXOL 300 MG/ML  SOLN
75.0000 mL | Freq: Once | INTRAMUSCULAR | Status: AC | PRN
  Administered 2018-08-09: 75 mL via INTRAVENOUS

## 2018-08-12 ENCOUNTER — Other Ambulatory Visit: Payer: Self-pay

## 2018-08-12 ENCOUNTER — Encounter: Payer: Self-pay | Admitting: Internal Medicine

## 2018-08-12 DIAGNOSIS — K56609 Unspecified intestinal obstruction, unspecified as to partial versus complete obstruction: Secondary | ICD-10-CM

## 2018-08-12 DIAGNOSIS — R9389 Abnormal findings on diagnostic imaging of other specified body structures: Secondary | ICD-10-CM

## 2018-08-12 NOTE — Progress Notes (Signed)
Spoke to patient  Plan is to get the [place] PET-CT scan to look for possible carcinoid  Dx: mesenteric nodule/mass Recurrent small bowel obstructions  If those dxs do not meet criteria to do this let me know - I would hope they would

## 2018-08-13 DIAGNOSIS — M4124 Other idiopathic scoliosis, thoracic region: Secondary | ICD-10-CM | POA: Diagnosis not present

## 2018-08-13 DIAGNOSIS — M546 Pain in thoracic spine: Secondary | ICD-10-CM | POA: Diagnosis not present

## 2018-08-13 DIAGNOSIS — M4004 Postural kyphosis, thoracic region: Secondary | ICD-10-CM | POA: Diagnosis not present

## 2018-08-14 ENCOUNTER — Telehealth: Payer: Self-pay | Admitting: Internal Medicine

## 2018-08-14 NOTE — Telephone Encounter (Signed)
Discussed instructions with the patient.  All questions answered.

## 2018-08-21 ENCOUNTER — Telehealth: Payer: Self-pay | Admitting: Internal Medicine

## 2018-08-21 NOTE — Telephone Encounter (Signed)
appt PET was cancelled will call later to reschedule. Staff message to call pt in a few weeks

## 2018-08-21 NOTE — Telephone Encounter (Signed)
Left message for patient to call back Dr. Carlean Purl can this wait in your opinion?

## 2018-08-21 NOTE — Telephone Encounter (Signed)
Pt called wanting to speak with the nurse about the schedule pet scan that is coming up.

## 2018-08-21 NOTE — Telephone Encounter (Signed)
Yes this can wait  Will need her to call us back or have a reminder otherwise  Thank you

## 2018-08-23 DIAGNOSIS — M4124 Other idiopathic scoliosis, thoracic region: Secondary | ICD-10-CM | POA: Diagnosis not present

## 2018-08-23 DIAGNOSIS — M4004 Postural kyphosis, thoracic region: Secondary | ICD-10-CM | POA: Diagnosis not present

## 2018-08-23 DIAGNOSIS — M546 Pain in thoracic spine: Secondary | ICD-10-CM | POA: Diagnosis not present

## 2018-08-28 ENCOUNTER — Ambulatory Visit (HOSPITAL_COMMUNITY): Payer: Medicare Other

## 2018-10-04 NOTE — Telephone Encounter (Signed)
Please advise on timing of PET.  Continue to delay due to Covid or reschedule now?

## 2018-10-10 NOTE — Telephone Encounter (Signed)
Should do PET when radiology doing non-urgent procedures again

## 2018-10-17 ENCOUNTER — Telehealth: Payer: Self-pay | Admitting: Internal Medicine

## 2018-10-17 NOTE — Telephone Encounter (Signed)
Pt states that she had an episode last night of intense stomach cramps and spasm but it resolved. She think that she has inflammation in her GI tract and would like some nutritional advise. Pls call her.

## 2018-10-17 NOTE — Telephone Encounter (Signed)
Patient reports that she had abdominal pain last night.  She reports that the pain has resolved as of this morning.  She does feel that her switching to a liquid diet help resolve her symptoms.  She feels weak today, but is much better.  She is advised to continue on a low residue diet.  She is still very concerned about Covid and wants to avoid any hospital encounters.  We discussed that she should let me know if she has continued pain and not ignore her symptoms.  She verbalized understanding that we can offer her care safely and will minimize any exposures, but she should not minimize her symptoms due to a fear of Covid.  She verbalized understanding and promises to call back if she has continued discomfort despite her dietary restrictions. She thanked me for the call, but wanted to reassure she feels much better today

## 2018-10-22 ENCOUNTER — Telehealth: Payer: Self-pay | Admitting: Internal Medicine

## 2018-10-22 NOTE — Telephone Encounter (Signed)
Pt reported that her blockage has cleared and she is having small BMs but they are not satisfying.  She is afraid of constipation -- currently taking 17 g Miralax d.  She would like a call to discuss increased dosage or switch over to another laxative.

## 2018-10-23 NOTE — Telephone Encounter (Signed)
Patient notified that she can adjust and titrate her Miralax to effect. She is otherwise doing well. She will call back if she has continued symptoms.

## 2018-11-13 NOTE — Telephone Encounter (Signed)
I left a message with Taylor's VM at [place].

## 2018-11-13 NOTE — Telephone Encounter (Signed)
I was able to reach her today 9had tried 1-2 other x)  She has had about 2 episodes of bloating and "feeling blocked" that were short-lived Goes to clear liquids and waits  Had bad diarrhea the other night  Weight up a few #  She is ok to do her [place] PET (NETSPOT GA 36 DOTATATE) scan to evaluate the mesenteric lesion on CT from 08/2018  If this done at [facility] imaging we should do there but if only at [facility] ok

## 2018-11-13 NOTE — Telephone Encounter (Signed)
Patient notified to arrive at [facility] on 11/21/18 at 2:45 and NPO after 9:00 am.  She verbalized understanding.

## 2018-11-15 ENCOUNTER — Telehealth: Payer: Self-pay | Admitting: Internal Medicine

## 2018-11-15 DIAGNOSIS — D649 Anemia, unspecified: Secondary | ICD-10-CM | POA: Diagnosis not present

## 2018-11-15 DIAGNOSIS — M810 Age-related osteoporosis without current pathological fracture: Secondary | ICD-10-CM | POA: Diagnosis not present

## 2018-11-15 NOTE — Telephone Encounter (Signed)
Pt stated that her instructions on MyChart are different from her verbal instructions and requested clarification.

## 2018-11-15 NOTE — Telephone Encounter (Signed)
Patient called wanting to speak with the nurse about her upcoming pet scan she stated that she had some questions that need clarifying. She will like a call back today if possible after 4pm

## 2018-11-15 NOTE — Telephone Encounter (Signed)
Patient has multiple questions about PET scan. She would like to hear from the department.  They are gone for the weekend.  I will try and reach them on Monday

## 2018-11-18 NOTE — Telephone Encounter (Signed)
I spoke with NM they will call the patient and answer all her questions.

## 2018-11-21 ENCOUNTER — Other Ambulatory Visit: Payer: Self-pay

## 2018-11-21 ENCOUNTER — Ambulatory Visit (HOSPITAL_COMMUNITY)
Admission: RE | Admit: 2018-11-21 | Discharge: 2018-11-21 | Disposition: A | Discharge status: Home / Self Care | Payer: Medicare Other | Source: Ambulatory Visit | Attending: Internal Medicine | Admitting: Internal Medicine

## 2018-11-21 DIAGNOSIS — K56609 Unspecified intestinal obstruction, unspecified as to partial versus complete obstruction: Secondary | ICD-10-CM | POA: Diagnosis not present

## 2018-11-21 DIAGNOSIS — R9389 Abnormal findings on diagnostic imaging of other specified body structures: Secondary | ICD-10-CM | POA: Diagnosis not present

## 2018-11-21 DIAGNOSIS — K654 Sclerosing mesenteritis: Secondary | ICD-10-CM | POA: Diagnosis not present

## 2018-11-21 MED ORDER — GALLIUM GA 68 DOTATATE IV KIT
2.1000 | PACK | Freq: Once | INTRAVENOUS | Status: AC | PRN
  Administered 2018-11-21: 2.1 via INTRAVENOUS

## 2018-11-22 NOTE — Progress Notes (Signed)
Jennifer Kirk,  This test is negative and that means nothing to suggest any type of tumor or cancer.  So great news.  Are you taking any iron supplements now?  You do need to have follow-up on the iron-deficiency anemia.  Have you had a visit with Dr. Felipa Eth?  Warm regards,  [name], MD, [facility]

## 2018-11-25 ENCOUNTER — Telehealth: Payer: Self-pay | Admitting: Internal Medicine

## 2018-11-25 NOTE — Telephone Encounter (Signed)
Pt states she could not figure out how to reply to the [place] message that Dr. Carlean Purl sent her. Pt wanted to let Dr. Carlean Purl know that she is not taking Iron supplements, she saw Dr. Felipa Eth on 6/12 and her Hgb was 12.1. She is so happy about pet result but wants to know what the area is, is it a benign growth and what should she expect from this area down the road. Please advise.

## 2018-11-25 NOTE — Telephone Encounter (Signed)
Patient called said she would like to answer Dr. Carlean Purl question regarding his CT scans that he asked her through the [place] on Friday 6-19

## 2018-11-26 DIAGNOSIS — M4124 Other idiopathic scoliosis, thoracic region: Secondary | ICD-10-CM | POA: Diagnosis not present

## 2018-11-26 DIAGNOSIS — M4004 Postural kyphosis, thoracic region: Secondary | ICD-10-CM | POA: Diagnosis not present

## 2018-11-26 DIAGNOSIS — M546 Pain in thoracic spine: Secondary | ICD-10-CM | POA: Diagnosis not present

## 2018-11-26 NOTE — Telephone Encounter (Signed)
The conclusion is it is a slightly enlarged lymph nopde that is not of consequence.  Please let her know.  She may see me prn

## 2018-11-27 NOTE — Telephone Encounter (Signed)
Spoke with pt and she is aware.

## 2018-12-17 DIAGNOSIS — M4124 Other idiopathic scoliosis, thoracic region: Secondary | ICD-10-CM | POA: Diagnosis not present

## 2018-12-17 DIAGNOSIS — M4004 Postural kyphosis, thoracic region: Secondary | ICD-10-CM | POA: Diagnosis not present

## 2018-12-17 DIAGNOSIS — M546 Pain in thoracic spine: Secondary | ICD-10-CM | POA: Diagnosis not present

## 2018-12-26 ENCOUNTER — Ambulatory Visit (INDEPENDENT_AMBULATORY_CARE_PROVIDER_SITE_OTHER): Payer: Medicare Other | Admitting: Internal Medicine

## 2018-12-26 ENCOUNTER — Encounter: Payer: Self-pay | Admitting: Internal Medicine

## 2018-12-26 DIAGNOSIS — K56609 Unspecified intestinal obstruction, unspecified as to partial versus complete obstruction: Secondary | ICD-10-CM

## 2018-12-26 DIAGNOSIS — K449 Diaphragmatic hernia without obstruction or gangrene: Secondary | ICD-10-CM | POA: Diagnosis not present

## 2018-12-26 DIAGNOSIS — D508 Other iron deficiency anemias: Secondary | ICD-10-CM

## 2018-12-26 NOTE — Assessment & Plan Note (Signed)
I do not think this is part of her symptoms though the fact that so much of her stomach is in her chest and she has scoliosis she could be having some sort of abnormal rotation of her bowels at times that we have not identified but is related to her periumbilical symptoms.

## 2018-12-26 NOTE — Progress Notes (Signed)
TELEHEALTH ENCOUNTER IN SETTING OF COVID-19 PANDEMIC - REQUESTED BY PATIENT SERVICE PROVIDED BY TELEMEDECINE - TYPE: Doximity AV PATIENT LOCATION: Home PATIENT HAS CONSENTED TO TELEHEALTH VISIT PROVIDER LOCATION: OFFICE REFERRING PROVIDER:N/A PARTICIPANTS OTHER THAN PATIENT:none TIME SPENT ON CALL:23 mins 62 sec   Jennifer Kirk 82 y.o. 1935-12-31 [id number]  Assessment & Plan:   Small bowel obstruction ([place]) She has not had another full-blown episodes though she continues to have some problems that make her think she might, she goes on a liquid diet with water she is using Gas-X but still have these issues which impair her quality of life.  It is possible she is having just some IBS problems as well.  We are going to try IBgard 2 before supper to see what that does and she will contact me with those results and will figure out where to go from there.  She might need something like an anticholinergic antispasmodic but I am going to try to avoid that given her age though she is a very fit 39.  We got very good information from the scan to see if this lesion in her small bowel was a carcinoid tumor and the scan is negative.  I do not think any further testing is indicated now and we discussed that we could run another test but she is not inclined to do so either.  Should I evaluate her further would consider something like a CT enterography.  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  I have her on a low fiber diet and she has been extremely diligent about that and would like to eat some other things like collard's.  We reviewed how pineapple is a very woody fruit and it should be chopped and cooked and very soft should she eat any of that.  She is having some emotional difficulty if you will dealing with this and talked about seeing a dietitian and I think that is a great idea so we will make a referral.  Iron deficiency anemia She tells me her last  hemoglobin was 12 she follows with Dr. Felipa Eth on this.  Large hiatal hernia I do not think this is part of her symptoms though the fact that so much of her stomach is in her chest and she has scoliosis she could be having some sort of abnormal rotation of her bowels at times that we have not identified but is related to her periumbilical symptoms.     Subjective:   Chief Complaint:  HPI  Allergies  Allergen Reactions  . Codeine Nausea And Vomiting  . Penicillins Rash    Has patient had a PCN reaction causing immediate rash, facial/tongue/throat swelling, SOB or lightheadedness with hypotension: Yes Has patient had a PCN reaction causing severe rash involving mucus membranes or skin necrosis: No Has patient had a PCN reaction that required hospitalization: No Has patient had a PCN reaction occurring within the last 10 years: No If all of the above answers are "NO", then may proceed with Cephalosporin use.   . Mobic [Meloxicam] Other (See Comments)    Loss of blood per patient  . Tramadol Nausea Only    Intolerance   Current Meds  Medication Sig  . acetaminophen (TYLENOL) 500 MG tablet Take 1,000 mg by mouth every 6 (six) hours as needed for headache.  . ALPRAZolam (XANAX) 1 MG tablet Take 0.5 mg by mouth at bedtime.   Marland Kitchen BIOTIN PO Take 1 capsule by mouth  daily.  . Calcium Carbonate-Vitamin D (CALCIUM-D PO) Take 2 tablets by mouth 2 (two) times daily.  Marland Kitchen denosumab (PROLIA) 60 MG/ML SOSY injection Inject 60 mg into the skin every 6 (six) months.  . famotidine (PEPCID) 40 MG tablet Take 40 mg by mouth daily before supper.   . Ginger, Zingiber officinalis, (GINGER PO) Take 1 capsule by mouth daily.  . hydrocortisone cream 1 % Apply 1 application topically daily as needed for itching (wound care).  . Misc Natural Products (APPLE CIDER VINEGAR DIET PO) Take by mouth 2 (two) times daily. 1 tablespoon daily , she adds ginger  . Multiple Vitamin (MULTIVITAMIN WITH MINERALS) TABS Take  1 tablet by mouth daily.  . NON FORMULARY Digest  Basic enzyme  1-2 w/meals  . Polyethyl Glycol-Propyl Glycol (SYSTANE OP) Place 2 drops into both eyes 2 (two) times daily.  . polyethylene glycol (MIRALAX / GLYCOLAX) packet Take 17 g by mouth daily.  . Probiotic Product (ALIGN PO) Take 1 tablet by mouth daily.  . vitamin C (ASCORBIC ACID) 500 MG tablet Take 500 mg by mouth daily.  Marland Kitchen VITAMIN D-VITAMIN K PO 1 tablet. Takes one twice a week (Tues and Sat)   Past Medical History:  Diagnosis Date  . Anxiety   . Degenerative disc disease   . GERD (gastroesophageal reflux disease)   . Hiatal hernia 2010   GERD,esophageal dysmotility and paraesophageal hernia on esophogram  . Osteopetrosis    degenerative spine disease, lumbar  . Scoliosis    Past Surgical History:  Procedure Laterality Date  . BIOPSY  02/22/2018   Procedure: BIOPSY;  Surgeon: Thornton Park, MD;  Location: [place];  Service: Gastroenterology;;  . BREAST EXCISIONAL BIOPSY Bilateral 08/2002   path: fibrocystic changes.  sclerosing adenosis with microcalcification.  Marland Kitchen BREAST LUMPECTOMY    . COLONOSCOPY  10/2007   screening study.  Dr Owens Loffler.  small internal hemorrhoids, no polyps, no diverticulosis.    . COLONOSCOPY WITH PROPOFOL N/A 02/23/2018   Procedure: COLONOSCOPY WITH PROPOFOL;  Surgeon: [name], MD;  [facility] ENDOSCOPY;  Service: Endoscopy;  Laterality: N/A;  . ESOPHAGOGASTRODUODENOSCOPY N/A 02/22/2018   Procedure: ESOPHAGOGASTRODUODENOSCOPY (EGD);  Surgeon: Thornton Park, MD;  Location: [place];  Service: Gastroenterology;  Laterality: N/A;   Social History   Social History Narrative   The patient is widowed   Is a never smoker and does drink occasional alcohol   family history includes Dementia in her father; Heart disease in her father; Stroke in her mother.   Review of Systems   Wt Readings from Last 3 Encounters:  12/26/18 98 lb (44.5 kg)  07/23/18 96 lb (43.5 kg)   05/20/18 97 lb (44 kg)

## 2018-12-26 NOTE — Assessment & Plan Note (Addendum)
She has not had another full-blown episodes though she continues to have some problems that make her think she might, she goes on a liquid diet with water she is using Gas-X but still have these issues which impair her quality of life.  It is possible she is having just some IBS problems as well.  We are going to try IBgard 2 before supper to see what that does and she will contact me with those results and will figure out where to go from there.  She might need something like an anticholinergic antispasmodic but I am going to try to avoid that given her age though she is a very fit 78.  We got very good information from the scan to see if this lesion in her small bowel was a carcinoid tumor and the scan is negative.  I do not think any further testing is indicated now and we discussed that we could run another test but she is not inclined to do so either.  Should I evaluate her further would consider something like a CT enterography.  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  I have her on a low fiber diet and she has been extremely diligent about that and would like to eat some other things like collard's.  We reviewed how pineapple is a very woody fruit and it should be chopped and cooked and very soft should she eat any of that.  She is having some emotional difficulty if you will dealing with this and talked about seeing a dietitian and I think that is a great idea so we will make a referral.

## 2018-12-26 NOTE — Patient Instructions (Addendum)
   As we discussed you may come pick up samples of IBgard to take 2 before supper.  If you have problems plan etc. you can take these at other times as well as written on the box.  Please let me know in a week or 2 how it is going with this and if it helps we will continue it, might consider a cheaper alternative that the patient told me about recently, they found it on [place].  The idea is to have the peppermint oil relax possible spasms in the small intestine.  Once I know how you are doing on this we will determine the need and timing of any telehealth or in person follow-up.   We will make a referral to a dietitian to help guide you in a diet that would reduce recurrent small bowel obstruction and the abdominal and gastrointestinal symptoms you are having that might be related to irritable bowel problems.   Please continue to follow-up with Dr. Felipa Eth about your hemoglobin and iron levels.  I appreciate the opportunity to care for you. [name], MD, [facility]

## 2018-12-26 NOTE — Assessment & Plan Note (Signed)
She tells me her last hemoglobin was 12 she follows with Dr. Felipa Eth on this.

## 2018-12-31 ENCOUNTER — Telehealth: Payer: Self-pay | Admitting: Internal Medicine

## 2018-12-31 ENCOUNTER — Telehealth: Payer: Self-pay

## 2018-12-31 DIAGNOSIS — M4004 Postural kyphosis, thoracic region: Secondary | ICD-10-CM | POA: Diagnosis not present

## 2018-12-31 DIAGNOSIS — M4124 Other idiopathic scoliosis, thoracic region: Secondary | ICD-10-CM | POA: Diagnosis not present

## 2018-12-31 DIAGNOSIS — M546 Pain in thoracic spine: Secondary | ICD-10-CM | POA: Diagnosis not present

## 2018-12-31 NOTE — Telephone Encounter (Signed)
I spoke with Jennifer Kirk and I'm mailing her the information she requested to save her a trip up here.

## 2018-12-31 NOTE — Telephone Encounter (Signed)
She is doing well on the IBgard and thinks it is helping. She will continue and call us back with an update in a week or so.

## 2018-12-31 NOTE — Telephone Encounter (Signed)
Pt would like some brochure about IBS, she stated that she forgot to take some last time she was at the office. She will come to pick it up today. Pls leave it at the front desk.

## 2019-01-06 ENCOUNTER — Telehealth: Payer: Self-pay | Admitting: Internal Medicine

## 2019-01-06 NOTE — Telephone Encounter (Signed)
Would you like her to continue Sir, daily or PRN?

## 2019-01-06 NOTE — Telephone Encounter (Signed)
Patient informed and wrote down the name of the alternate choice as well. She has a 40% off coupon at [facility] that she is going to use on the IBgard. She feels better using this and she will call us with any more problems/pain that she has.

## 2019-01-06 NOTE — Telephone Encounter (Signed)
As needed is fine - it makes her feel better - doesn't change anything long-term  She can buy in store  She can buy on [place] and also can try doctor MK's NATURAL Digestive support which is very similar and much ccheaper ([place]za[place])

## 2019-01-14 DIAGNOSIS — M4004 Postural kyphosis, thoracic region: Secondary | ICD-10-CM | POA: Diagnosis not present

## 2019-01-14 DIAGNOSIS — M4124 Other idiopathic scoliosis, thoracic region: Secondary | ICD-10-CM | POA: Diagnosis not present

## 2019-01-14 DIAGNOSIS — M546 Pain in thoracic spine: Secondary | ICD-10-CM | POA: Diagnosis not present

## 2019-01-21 ENCOUNTER — Encounter: Payer: Self-pay | Admitting: Skilled Nursing Facility1

## 2019-01-21 ENCOUNTER — Encounter: Payer: Medicare Other | Attending: Internal Medicine | Admitting: Skilled Nursing Facility1

## 2019-01-21 ENCOUNTER — Other Ambulatory Visit: Payer: Self-pay

## 2019-01-21 DIAGNOSIS — K56609 Unspecified intestinal obstruction, unspecified as to partial versus complete obstruction: Secondary | ICD-10-CM | POA: Diagnosis not present

## 2019-01-21 DIAGNOSIS — D508 Other iron deficiency anemias: Secondary | ICD-10-CM | POA: Diagnosis not present

## 2019-01-21 DIAGNOSIS — Z713 Dietary counseling and surveillance: Secondary | ICD-10-CM | POA: Diagnosis not present

## 2019-01-21 NOTE — Progress Notes (Signed)
Assessment:  Primary concerns today: SBO-Adhesions   Pt states she cannot tolerate iron supplements causing GI disturbances. Pt states she originally went to the hospital for anemia. Pt states she then suffered from intestinal pains finding she had a SBO. Pt states she has been on a low fiber diet and is still feeling like she is about to have a blockage. Pt states she may have an enlarged lymph node (not cancerous) in the small intestine. Pt sates she is so scared she will have another blockage. Pt states IBgaurd has been working really well. Pt states she is still experiencing gas pains in her abdomen.  Pt states yesterday she did not eat anything differently than usually started having pains in her lower abdomen with a feeling of urgency.  Pt states she eats doughnuts and ice cream to keep her weight up. Pt states she has been keeping a diary of her foods. Keeping up with protein, fiber, calories.  Pt states she she avoids artifical sugars.  Pt states this has consumed her life.  Pt states she has been following 10 grams of fiber since February.  Pt states she has had a couple episodes needing more IBgaurd than usual stopping her pains and pushes water.  Pt states she sometimes gets lght headed at night Pt states she is going to make more notes on when she is feeling lightheaded.  Pt states she is taking a OTC enzyme supplement.  Pt states her energy veries but in all good.  Pt states she misses church and being social which has been hard.  Pt states when all of her symptoms started she was about 59 pounds.   Pt states she really wants to try increasing her fiber content but also states she only gets to 10 grams of fiber 1-2 times a week hitting usually around 5-8 on most days.   MEDICATIONS: See List   DIETARY INTAKE:  Usual eating pattern includes 3 meals and 2-3 snacks per day.  Everyday foods include none stated.  Avoided foods include high fiber.    24-hr recall: ice cream 3-4  times a week B ( AM): before coffee(with cream and sugar) 1/2 applesauce and 1/2 yogurt (finishing with medications later) Snk ( AM): physical therapy exercises for scoliosis  L ( PM): frozen meal: chipped beef and crazy with 2 slices of toast (made into 3 breakfasts) Snk ( PM): sandwich or boiled eggs or cheese sandwich or cottage cheese 1/2 D ( 6 PM): rice and chicken pie Snk ( PM):  Beverages: 50-60 ounces of water a day  Usual physical activity:   Estimated energy needs: 1000 calories 112 g carbohydrates 50-60 g protein 35-40 g fat  Progress Towards Goal(s):  In progress.    Intervention:  Nutrition counseling. Plan: Try reaching 10 grams of fiber for 1 month if you experience cramping back down to 5-8 for 2 days; if this goes well we can try to increase after the month if you still want to increase  Goals: -Get your fiber up to 10 grams a day -Get in a minimum of 60 ounces of fluid a day -Do not have fresh fruits or vegetables with peels consecutively  -Start writing your symptoms down in the same log as your foods -Try 4 ounces of kefir in the morning (any flavor in the yogurt aisle)  -Try to get close to 50- 60 grams of protein a day -If you experience any cramping back down to 5 grams of fiber for 2  days -Check your blood pressure if you feel lightheaded   Teaching Method Utilized:  Visual Auditory Hands on  Handouts given during visit include:  Low fiber meal plan and foods  Barriers to learning/adherence to lifestyle change: none identified   Demonstrated degree of understanding via:  Teach Back   Monitoring/Evaluation:  Dietary intake, exercise, and body weight prn.

## 2019-01-21 NOTE — Patient Instructions (Addendum)
-  Get your fiber up to 10 grams a day  -Get in a minimum of 60 ounces of fluid a day  -Do not have fresh fruits or vegetables with peels consecutively   -Start writing your symptoms down in the same log as your foods  -Try 4 ounces of kefir in the morning (any flavor in the yogurt aisle)   -Try to get close to 50- 60 grams of protein a day  -If you experience any cramping back down to 5 grams of fiber for 2 days  -Check your blood pressure if you feel lightheaded

## 2019-01-27 DIAGNOSIS — M4124 Other idiopathic scoliosis, thoracic region: Secondary | ICD-10-CM | POA: Diagnosis not present

## 2019-01-27 DIAGNOSIS — M4004 Postural kyphosis, thoracic region: Secondary | ICD-10-CM | POA: Diagnosis not present

## 2019-01-27 DIAGNOSIS — M546 Pain in thoracic spine: Secondary | ICD-10-CM | POA: Diagnosis not present

## 2019-02-11 ENCOUNTER — Other Ambulatory Visit: Payer: Self-pay

## 2019-02-11 ENCOUNTER — Encounter: Payer: Medicare Other | Attending: Internal Medicine | Admitting: Skilled Nursing Facility1

## 2019-02-11 DIAGNOSIS — Z713 Dietary counseling and surveillance: Secondary | ICD-10-CM | POA: Diagnosis not present

## 2019-02-11 DIAGNOSIS — K56609 Unspecified intestinal obstruction, unspecified as to partial versus complete obstruction: Secondary | ICD-10-CM

## 2019-02-11 DIAGNOSIS — D508 Other iron deficiency anemias: Secondary | ICD-10-CM | POA: Insufficient documentation

## 2019-02-11 NOTE — Progress Notes (Signed)
  Assessment:  Primary concerns today: SBO-Adhesions    Pt states she has a large hiatal hernia. Pt states liquid ginger really helps her indigestion very quickly. Pt states most days she had about 9 grams of fiber int he last 18 days. Pt states the only symptom she had was some bloat but with IBgaurd it was fine and only had to take it 2-3 times above the 2 she takes daily. Pt states she really wants romaine lettuce dietitian advised take the stalk out of the middle and have 1 piece and wait 2 days to decide if it went well. Pt states she tried the kefir and did not have a reaction.    MEDICATIONS: See List   DIETARY INTAKE:  Usual eating pattern includes 3 meals and 2-3 snacks per day.  Everyday foods include none stated.  Avoided foods include high fiber.    24-hr recall: ice cream 3-4 times a week B ( AM): before coffee(with cream and sugar) 1/2 applesauce and 1/2 yogurt (finishing with medications later) Snk ( AM): physical therapy exercises for scoliosis  L ( PM): frozen meal: chipped beef and crazy with 2 slices of toast (made into 3 breakfasts) Snk ( PM): sandwich or boiled eggs or cheese sandwich or cottage cheese 1/2 D ( 6 PM): rice and chicken pie Snk ( PM):  Beverages: 50-60 ounces of water a day  Usual physical activity:   Estimated energy needs: 1000 calories 112 g carbohydrates 50-60 g protein 35-40 g fat  Progress Towards Goal(s):  In progress.    Intervention:  Nutrition counseling. Plan: Try reaching 10 grams of fiber for 1 month if you experience cramping back down to 5-8 for 2 days; if this goes well we can try to increase after the month if you still want to increase  Goals: Maintain 10 grams of fiber a day On days you are trying raw foods knock it down to 8 grams of fiber total Be sure to chew your food very well  Teaching Method Utilized:  Visual Auditory Hands on  Handouts given during visit include:  Low fiber meal plan and foods  Barriers to  learning/adherence to lifestyle change: none identified   Demonstrated degree of understanding via:  Teach Back   Monitoring/Evaluation:  Dietary intake, exercise, and body weight prn.

## 2019-02-12 ENCOUNTER — Telehealth: Payer: Self-pay | Admitting: Internal Medicine

## 2019-02-12 NOTE — Telephone Encounter (Signed)
I spoke with Jennifer Kirk and she is taking 2 IBgard before dinner with success having only occasional cramping and bloating. She usually eats between 6-6:30pm. If she does have more symptoms after dinner she wanted to know if she could take an extra one and would she need to eat with it.    I told her we didn't sign her up for the My fitness plan. She will check with her other Dr.'s   She is up to 99 lbs and trying to eat 10 grams of fiber and 60 grams of protein daily.  She said she is very appreciative of Dr Celesta Aver care.

## 2019-02-13 DIAGNOSIS — M546 Pain in thoracic spine: Secondary | ICD-10-CM | POA: Diagnosis not present

## 2019-02-13 DIAGNOSIS — M4004 Postural kyphosis, thoracic region: Secondary | ICD-10-CM | POA: Diagnosis not present

## 2019-02-13 DIAGNOSIS — M4124 Other idiopathic scoliosis, thoracic region: Secondary | ICD-10-CM | POA: Diagnosis not present

## 2019-02-13 NOTE — Telephone Encounter (Signed)
Left a detailed message on her home voicemail with what Dr Carlean Purl said.

## 2019-02-13 NOTE — Telephone Encounter (Signed)
Dietitian may heve set up the fitness thing  She may use extra IB Gard at bedtime

## 2019-02-19 ENCOUNTER — Ambulatory Visit: Payer: Medicare Other | Admitting: Skilled Nursing Facility1

## 2019-02-25 DIAGNOSIS — M546 Pain in thoracic spine: Secondary | ICD-10-CM | POA: Diagnosis not present

## 2019-02-25 DIAGNOSIS — M4004 Postural kyphosis, thoracic region: Secondary | ICD-10-CM | POA: Diagnosis not present

## 2019-02-25 DIAGNOSIS — M4124 Other idiopathic scoliosis, thoracic region: Secondary | ICD-10-CM | POA: Diagnosis not present

## 2019-03-03 ENCOUNTER — Telehealth: Payer: Self-pay | Admitting: Internal Medicine

## 2019-03-03 NOTE — Telephone Encounter (Signed)
The pt states she had a lot of bloating and abd pain over the weekend.  She says she took IBguard over the weekend and started passing gas and did have a bowel movement.  She states that she feels very well now and will call back if the symptoms return.  She has a history of SBO. Please advise of any further recommendations

## 2019-03-03 NOTE — Telephone Encounter (Signed)
The patient has been notified of this information and all questions answered.

## 2019-03-03 NOTE — Telephone Encounter (Signed)
If she feels ok now then nothing further.  I expect these will come and go and agree with how she handled it

## 2019-03-17 DIAGNOSIS — M4124 Other idiopathic scoliosis, thoracic region: Secondary | ICD-10-CM | POA: Diagnosis not present

## 2019-03-17 DIAGNOSIS — M4004 Postural kyphosis, thoracic region: Secondary | ICD-10-CM | POA: Diagnosis not present

## 2019-03-17 DIAGNOSIS — M546 Pain in thoracic spine: Secondary | ICD-10-CM | POA: Diagnosis not present

## 2019-04-08 DIAGNOSIS — M546 Pain in thoracic spine: Secondary | ICD-10-CM | POA: Diagnosis not present

## 2019-04-08 DIAGNOSIS — M4124 Other idiopathic scoliosis, thoracic region: Secondary | ICD-10-CM | POA: Diagnosis not present

## 2019-04-08 DIAGNOSIS — M4004 Postural kyphosis, thoracic region: Secondary | ICD-10-CM | POA: Diagnosis not present

## 2019-04-14 DIAGNOSIS — Z23 Encounter for immunization: Secondary | ICD-10-CM | POA: Diagnosis not present

## 2019-04-14 DIAGNOSIS — Z1389 Encounter for screening for other disorder: Secondary | ICD-10-CM | POA: Diagnosis not present

## 2019-04-14 DIAGNOSIS — Z0000 Encounter for general adult medical examination without abnormal findings: Secondary | ICD-10-CM | POA: Diagnosis not present

## 2019-04-14 DIAGNOSIS — D500 Iron deficiency anemia secondary to blood loss (chronic): Secondary | ICD-10-CM | POA: Diagnosis not present

## 2019-04-14 DIAGNOSIS — Z79899 Other long term (current) drug therapy: Secondary | ICD-10-CM | POA: Diagnosis not present

## 2019-04-14 DIAGNOSIS — M810 Age-related osteoporosis without current pathological fracture: Secondary | ICD-10-CM | POA: Diagnosis not present

## 2019-04-14 DIAGNOSIS — E7800 Pure hypercholesterolemia, unspecified: Secondary | ICD-10-CM | POA: Diagnosis not present

## 2019-04-22 DIAGNOSIS — M546 Pain in thoracic spine: Secondary | ICD-10-CM | POA: Diagnosis not present

## 2019-04-22 DIAGNOSIS — M4124 Other idiopathic scoliosis, thoracic region: Secondary | ICD-10-CM | POA: Diagnosis not present

## 2019-04-22 DIAGNOSIS — M4004 Postural kyphosis, thoracic region: Secondary | ICD-10-CM | POA: Diagnosis not present

## 2019-05-06 DIAGNOSIS — M546 Pain in thoracic spine: Secondary | ICD-10-CM | POA: Diagnosis not present

## 2019-05-06 DIAGNOSIS — M4004 Postural kyphosis, thoracic region: Secondary | ICD-10-CM | POA: Diagnosis not present

## 2019-05-06 DIAGNOSIS — M4124 Other idiopathic scoliosis, thoracic region: Secondary | ICD-10-CM | POA: Diagnosis not present

## 2019-06-03 DIAGNOSIS — M4004 Postural kyphosis, thoracic region: Secondary | ICD-10-CM | POA: Diagnosis not present

## 2019-06-03 DIAGNOSIS — M546 Pain in thoracic spine: Secondary | ICD-10-CM | POA: Diagnosis not present

## 2019-06-03 DIAGNOSIS — M4124 Other idiopathic scoliosis, thoracic region: Secondary | ICD-10-CM | POA: Diagnosis not present

## 2019-07-01 DIAGNOSIS — M546 Pain in thoracic spine: Secondary | ICD-10-CM | POA: Diagnosis not present

## 2019-07-01 DIAGNOSIS — M4124 Other idiopathic scoliosis, thoracic region: Secondary | ICD-10-CM | POA: Diagnosis not present

## 2019-07-01 DIAGNOSIS — M4004 Postural kyphosis, thoracic region: Secondary | ICD-10-CM | POA: Diagnosis not present

## 2019-07-22 DIAGNOSIS — M4124 Other idiopathic scoliosis, thoracic region: Secondary | ICD-10-CM | POA: Diagnosis not present

## 2019-07-22 DIAGNOSIS — M546 Pain in thoracic spine: Secondary | ICD-10-CM | POA: Diagnosis not present

## 2019-07-22 DIAGNOSIS — M4004 Postural kyphosis, thoracic region: Secondary | ICD-10-CM | POA: Diagnosis not present

## 2019-07-31 DIAGNOSIS — M546 Pain in thoracic spine: Secondary | ICD-10-CM | POA: Diagnosis not present

## 2019-07-31 DIAGNOSIS — M4004 Postural kyphosis, thoracic region: Secondary | ICD-10-CM | POA: Diagnosis not present

## 2019-07-31 DIAGNOSIS — M4124 Other idiopathic scoliosis, thoracic region: Secondary | ICD-10-CM | POA: Diagnosis not present

## 2019-08-05 DIAGNOSIS — M810 Age-related osteoporosis without current pathological fracture: Secondary | ICD-10-CM | POA: Diagnosis not present

## 2019-08-12 DIAGNOSIS — M4124 Other idiopathic scoliosis, thoracic region: Secondary | ICD-10-CM | POA: Diagnosis not present

## 2019-08-12 DIAGNOSIS — M546 Pain in thoracic spine: Secondary | ICD-10-CM | POA: Diagnosis not present

## 2019-08-12 DIAGNOSIS — M4004 Postural kyphosis, thoracic region: Secondary | ICD-10-CM | POA: Diagnosis not present

## 2019-08-26 DIAGNOSIS — M546 Pain in thoracic spine: Secondary | ICD-10-CM | POA: Diagnosis not present

## 2019-08-26 DIAGNOSIS — M4004 Postural kyphosis, thoracic region: Secondary | ICD-10-CM | POA: Diagnosis not present

## 2019-08-26 DIAGNOSIS — M4124 Other idiopathic scoliosis, thoracic region: Secondary | ICD-10-CM | POA: Diagnosis not present

## 2019-09-16 DIAGNOSIS — M4004 Postural kyphosis, thoracic region: Secondary | ICD-10-CM | POA: Diagnosis not present

## 2019-09-16 DIAGNOSIS — M546 Pain in thoracic spine: Secondary | ICD-10-CM | POA: Diagnosis not present

## 2019-09-16 DIAGNOSIS — M4124 Other idiopathic scoliosis, thoracic region: Secondary | ICD-10-CM | POA: Diagnosis not present

## 2019-09-17 ENCOUNTER — Telehealth: Payer: Self-pay | Admitting: Internal Medicine

## 2019-09-17 NOTE — Telephone Encounter (Signed)
Spoke with patient and used [email or web address] and her zipcode and was able to find the [facility] in [place] has them in stock. She verbalized understanding. Walked her through how to use the website if she needs it in the future. She also has an OTC [place] with peppermint oil and fennel, she was ok with taking that if she runs out of IB gard, I told her that was fine. She has been taking 2 tablets twice a day and it has really helped. She will call back if she needs anything.

## 2019-09-30 DIAGNOSIS — M4124 Other idiopathic scoliosis, thoracic region: Secondary | ICD-10-CM | POA: Diagnosis not present

## 2019-09-30 DIAGNOSIS — M4004 Postural kyphosis, thoracic region: Secondary | ICD-10-CM | POA: Diagnosis not present

## 2019-09-30 DIAGNOSIS — M546 Pain in thoracic spine: Secondary | ICD-10-CM | POA: Diagnosis not present

## 2019-10-07 DIAGNOSIS — H52203 Unspecified astigmatism, bilateral: Secondary | ICD-10-CM | POA: Diagnosis not present

## 2019-10-07 DIAGNOSIS — Z961 Presence of intraocular lens: Secondary | ICD-10-CM | POA: Diagnosis not present

## 2019-10-07 DIAGNOSIS — H1789 Other corneal scars and opacities: Secondary | ICD-10-CM | POA: Diagnosis not present

## 2019-10-07 DIAGNOSIS — H26491 Other secondary cataract, right eye: Secondary | ICD-10-CM | POA: Diagnosis not present

## 2019-10-16 DIAGNOSIS — M4004 Postural kyphosis, thoracic region: Secondary | ICD-10-CM | POA: Diagnosis not present

## 2019-10-16 DIAGNOSIS — M4124 Other idiopathic scoliosis, thoracic region: Secondary | ICD-10-CM | POA: Diagnosis not present

## 2019-10-16 DIAGNOSIS — M546 Pain in thoracic spine: Secondary | ICD-10-CM | POA: Diagnosis not present

## 2019-10-27 DIAGNOSIS — M546 Pain in thoracic spine: Secondary | ICD-10-CM | POA: Diagnosis not present

## 2019-10-27 DIAGNOSIS — M4124 Other idiopathic scoliosis, thoracic region: Secondary | ICD-10-CM | POA: Diagnosis not present

## 2019-10-27 DIAGNOSIS — M4004 Postural kyphosis, thoracic region: Secondary | ICD-10-CM | POA: Diagnosis not present

## 2019-11-17 DIAGNOSIS — M4124 Other idiopathic scoliosis, thoracic region: Secondary | ICD-10-CM | POA: Diagnosis not present

## 2019-11-17 DIAGNOSIS — M4004 Postural kyphosis, thoracic region: Secondary | ICD-10-CM | POA: Diagnosis not present

## 2019-11-17 DIAGNOSIS — M546 Pain in thoracic spine: Secondary | ICD-10-CM | POA: Diagnosis not present

## 2019-12-09 IMAGING — RF DG ESOPHAGUS
6 series · 14 of 24 positions shown · non-contrast
Comparison: None.

CLINICAL DATA: Dysphagia.

EXAM:
ESOPHOGRAM / BARIUM SWALLOW / BARIUM TABLET STUDY
TECHNIQUE: Combined double contrast and single contrast examination performed
using effervescent crystals, thick barium liquid, and thin barium
liquid. The patient was observed with fluoroscopy swallowing a 13 mm
barium sulphate tablet.
FLUOROSCOPY TIME:  Fluoroscopy Time:  2 minutes 6 seconds
Radiation Exposure Index (if provided by the fluoroscopic device):
24 mGy
Number of Acquired Spot Images: 12

[Series 1: sequence · 1 of 5 frames shown (1 of 4)]
[frame 1/5]
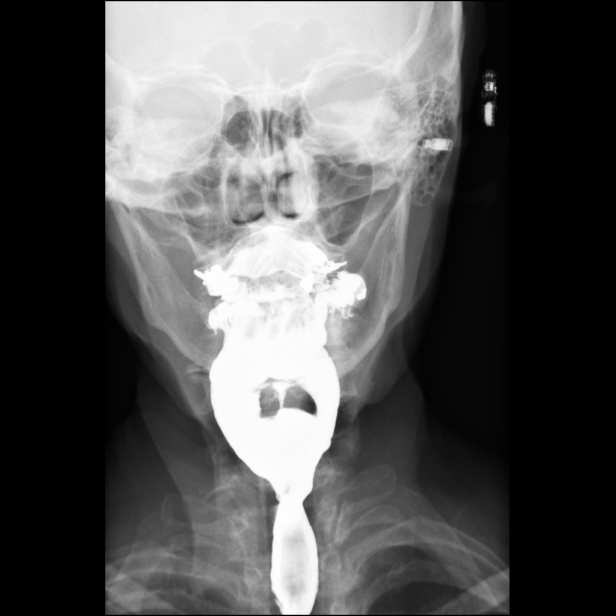

[Series 2: one shot · 1 of 1 slices shown (1 of 2)]
[im 1/1]
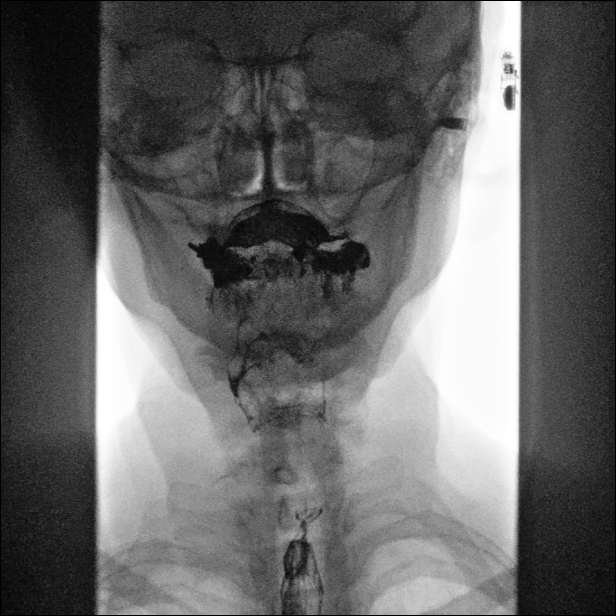

[Series 3: sequence · 1 of 7 frames shown (2 of 4)]
[frame 6/7]
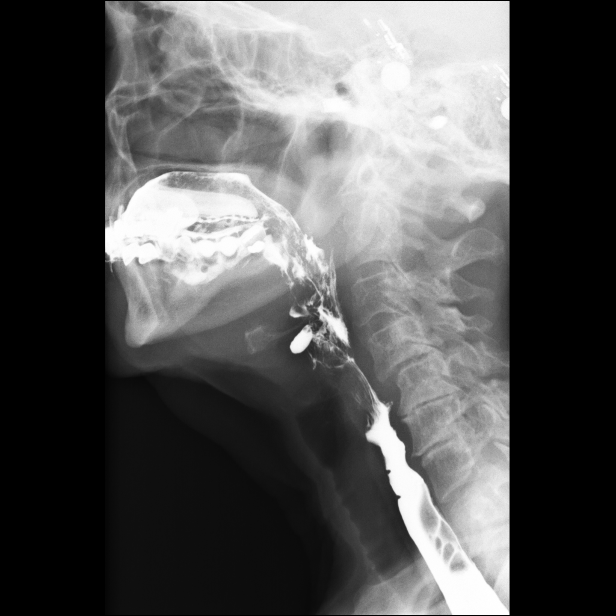

[Series 5: sequence · 2 of 17 frames shown (3 of 4)]
[frame 3/17]
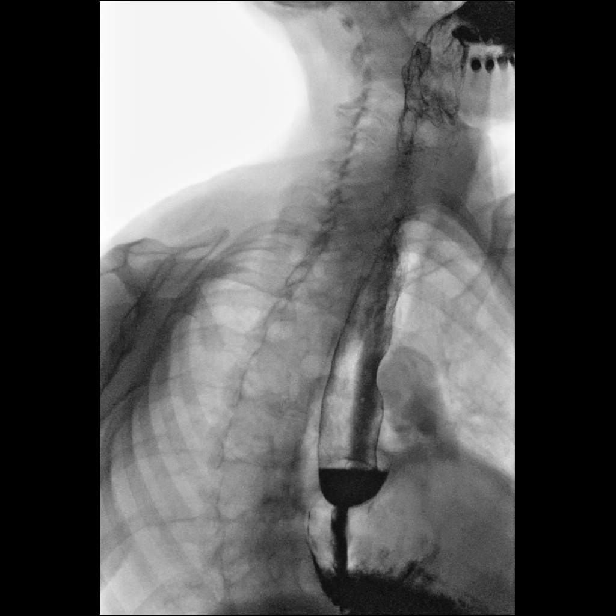
[frame 4/17]
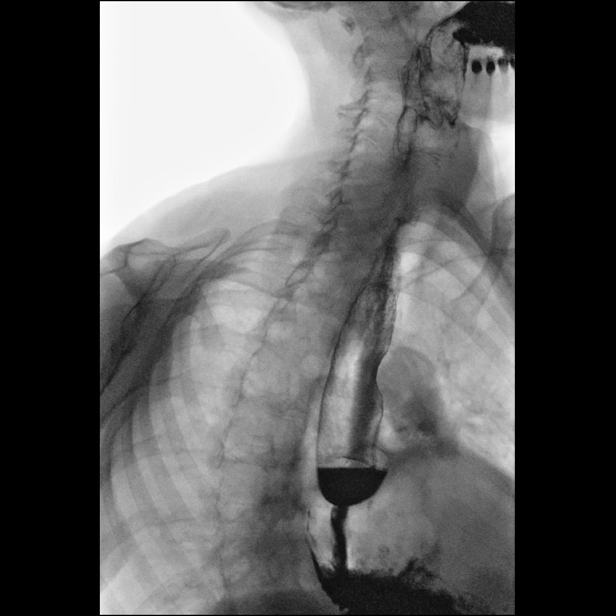

[Series 6: sequence · 2 of 18 frames shown (4 of 4)]
[frame 3/18]
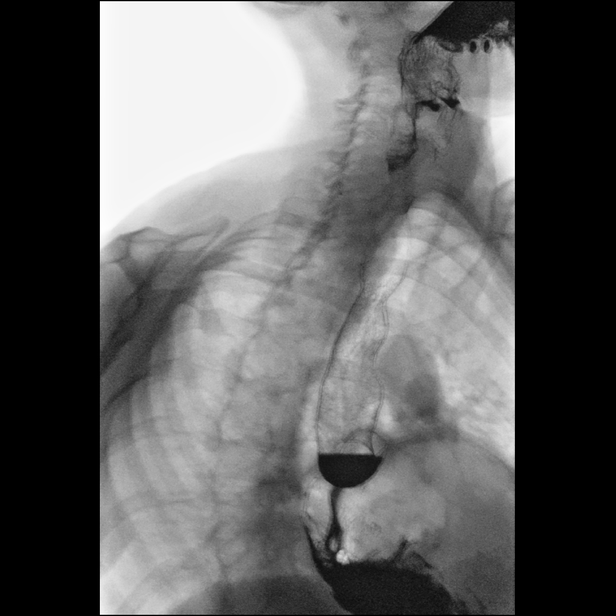
[frame 16/18]
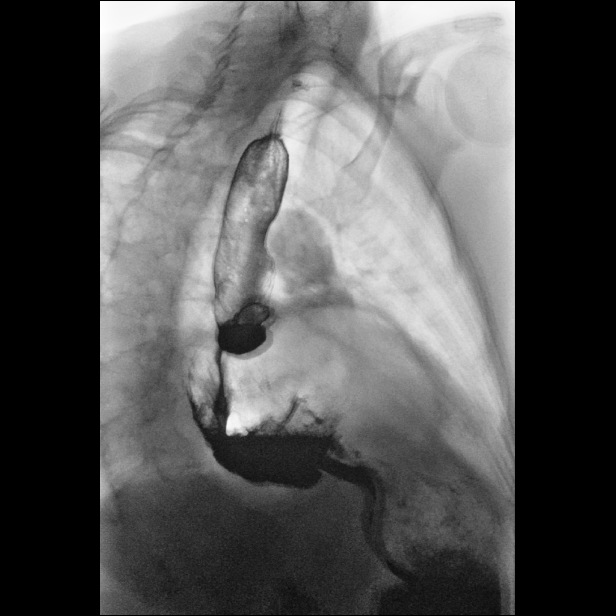

[Series 7: one shot · 7 of 17 slices shown (2 of 2)]
[im 1/17]
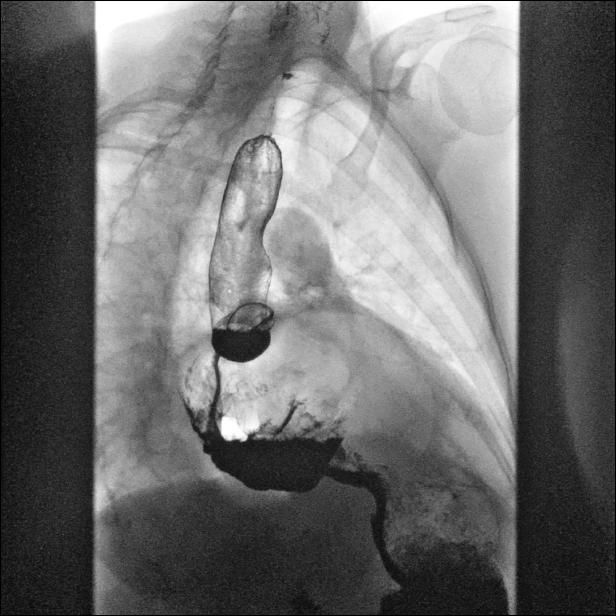
[im 4/17]
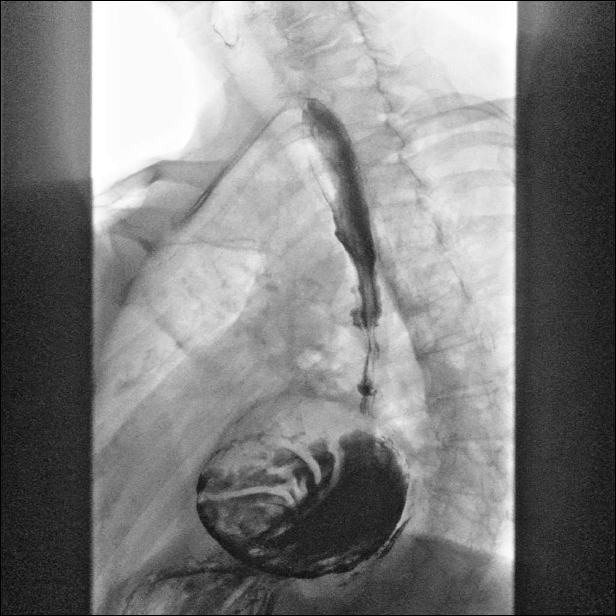
[im 7/17]
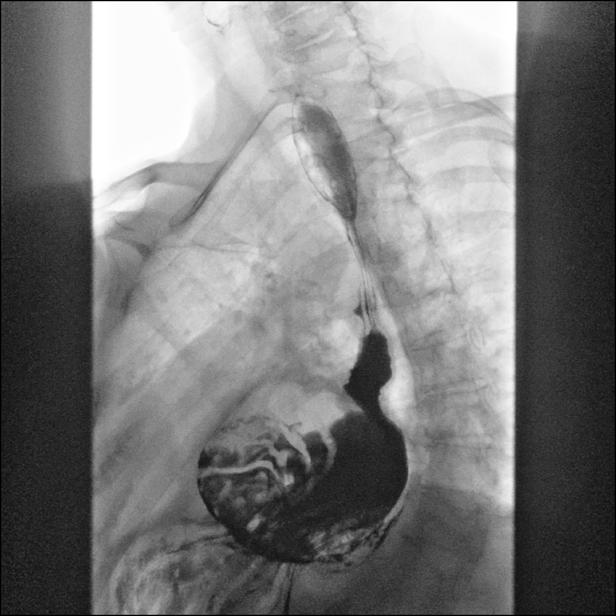
[im 10/17]
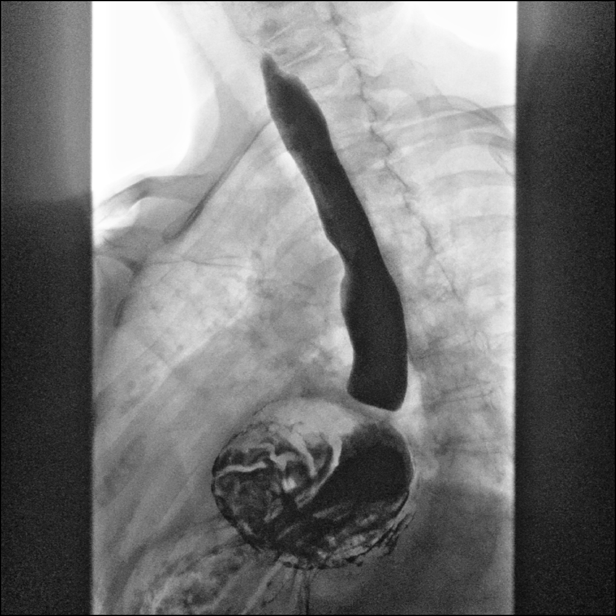
[im 11/17]
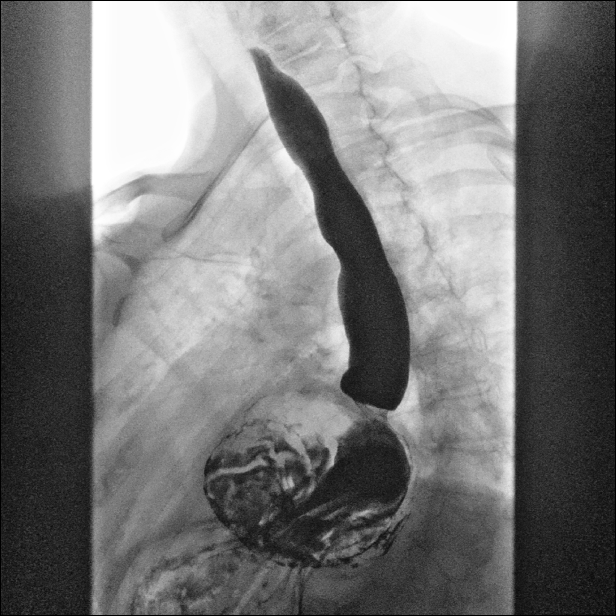
[im 14/17]
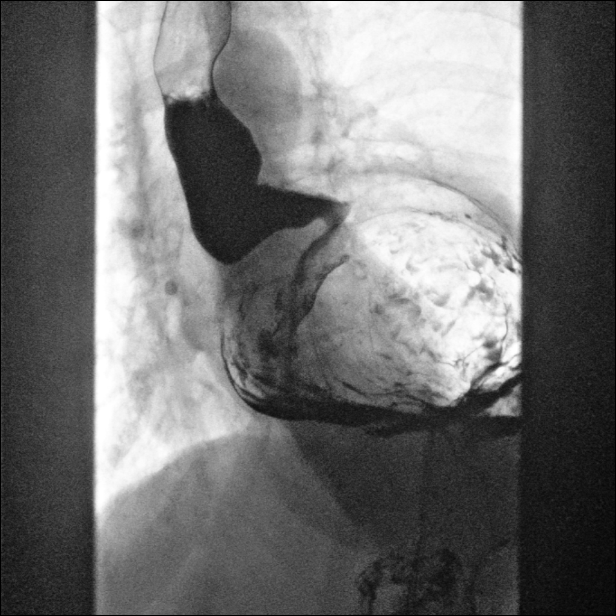
[im 17/17]
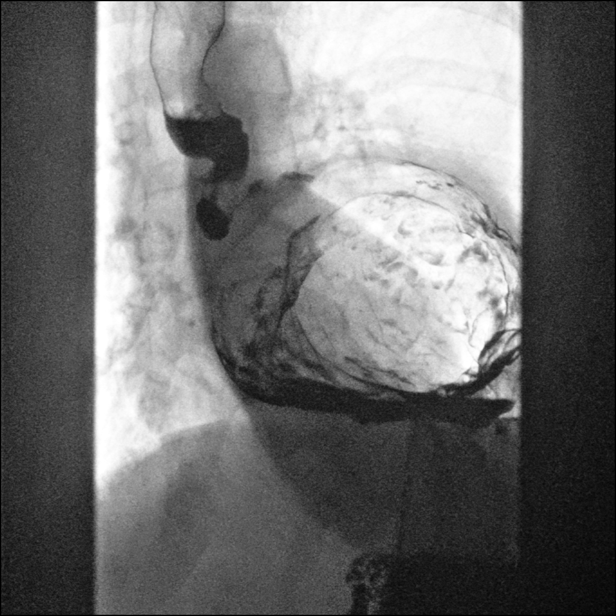

[14 of 24 positions shown; findings below may reference images not displayed]

FINDINGS: Swallowing mechanism is within normal limits. Mild esophageal
dysmotility. No esophageal fold thickening, stricture or
obstruction. Large hiatal hernia. A 13 mm barium tablet would not
pass beyond the distal esophagus and is felt to be due to distal
esophageal tortuosity, rather than a stricture.
IMPRESSION: 1. Mild esophageal dysmotility.
2. Large hiatal hernia.
3. Failure of a 13 mm barium tablet to pass into the stomach is felt
to be due to distal esophageal tortuosity rather than stricture.

## 2020-01-01 DIAGNOSIS — M4004 Postural kyphosis, thoracic region: Secondary | ICD-10-CM | POA: Diagnosis not present

## 2020-01-01 DIAGNOSIS — M4124 Other idiopathic scoliosis, thoracic region: Secondary | ICD-10-CM | POA: Diagnosis not present

## 2020-01-01 DIAGNOSIS — M546 Pain in thoracic spine: Secondary | ICD-10-CM | POA: Diagnosis not present

## 2020-01-03 IMAGING — CR DG LUMBAR SPINE 2-3V
3 series · 3 of 3 positions shown · non-contrast
Comparison: MRI lumbar spine 02/20/2012, lumbar spine radiographs
01/27/2005

CLINICAL DATA: Acute LEFT side low back pain with LEFT-sided
sciatica

EXAM:
LUMBAR SPINE - 2-3 VIEW

[t l-spine a.p.]
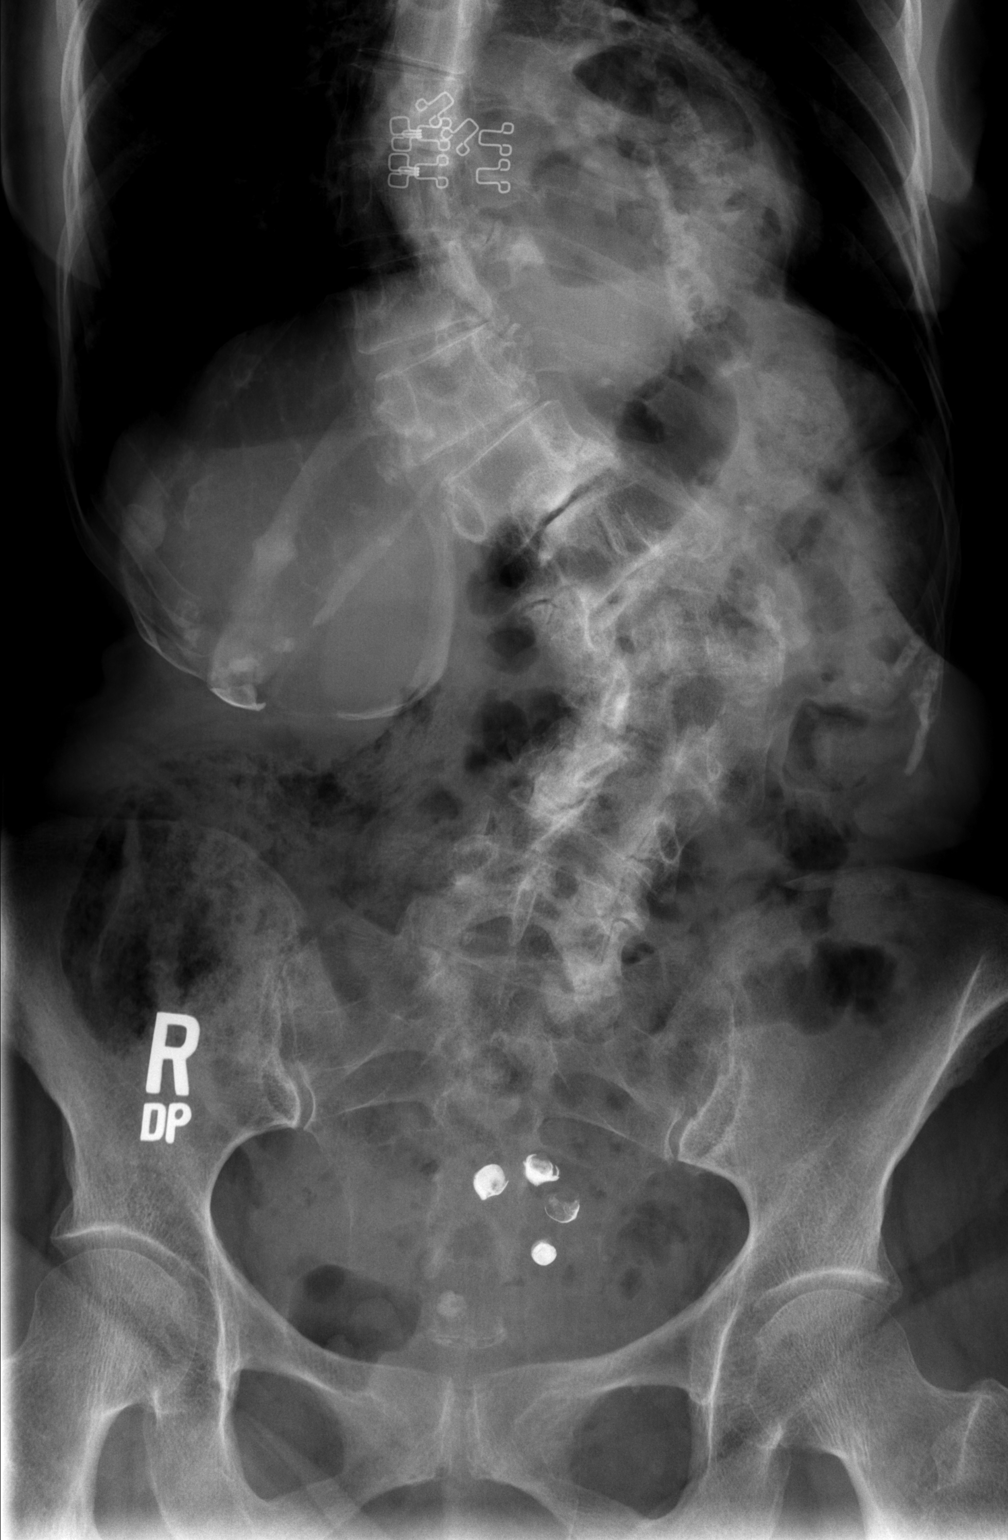

[t l-spine lat]
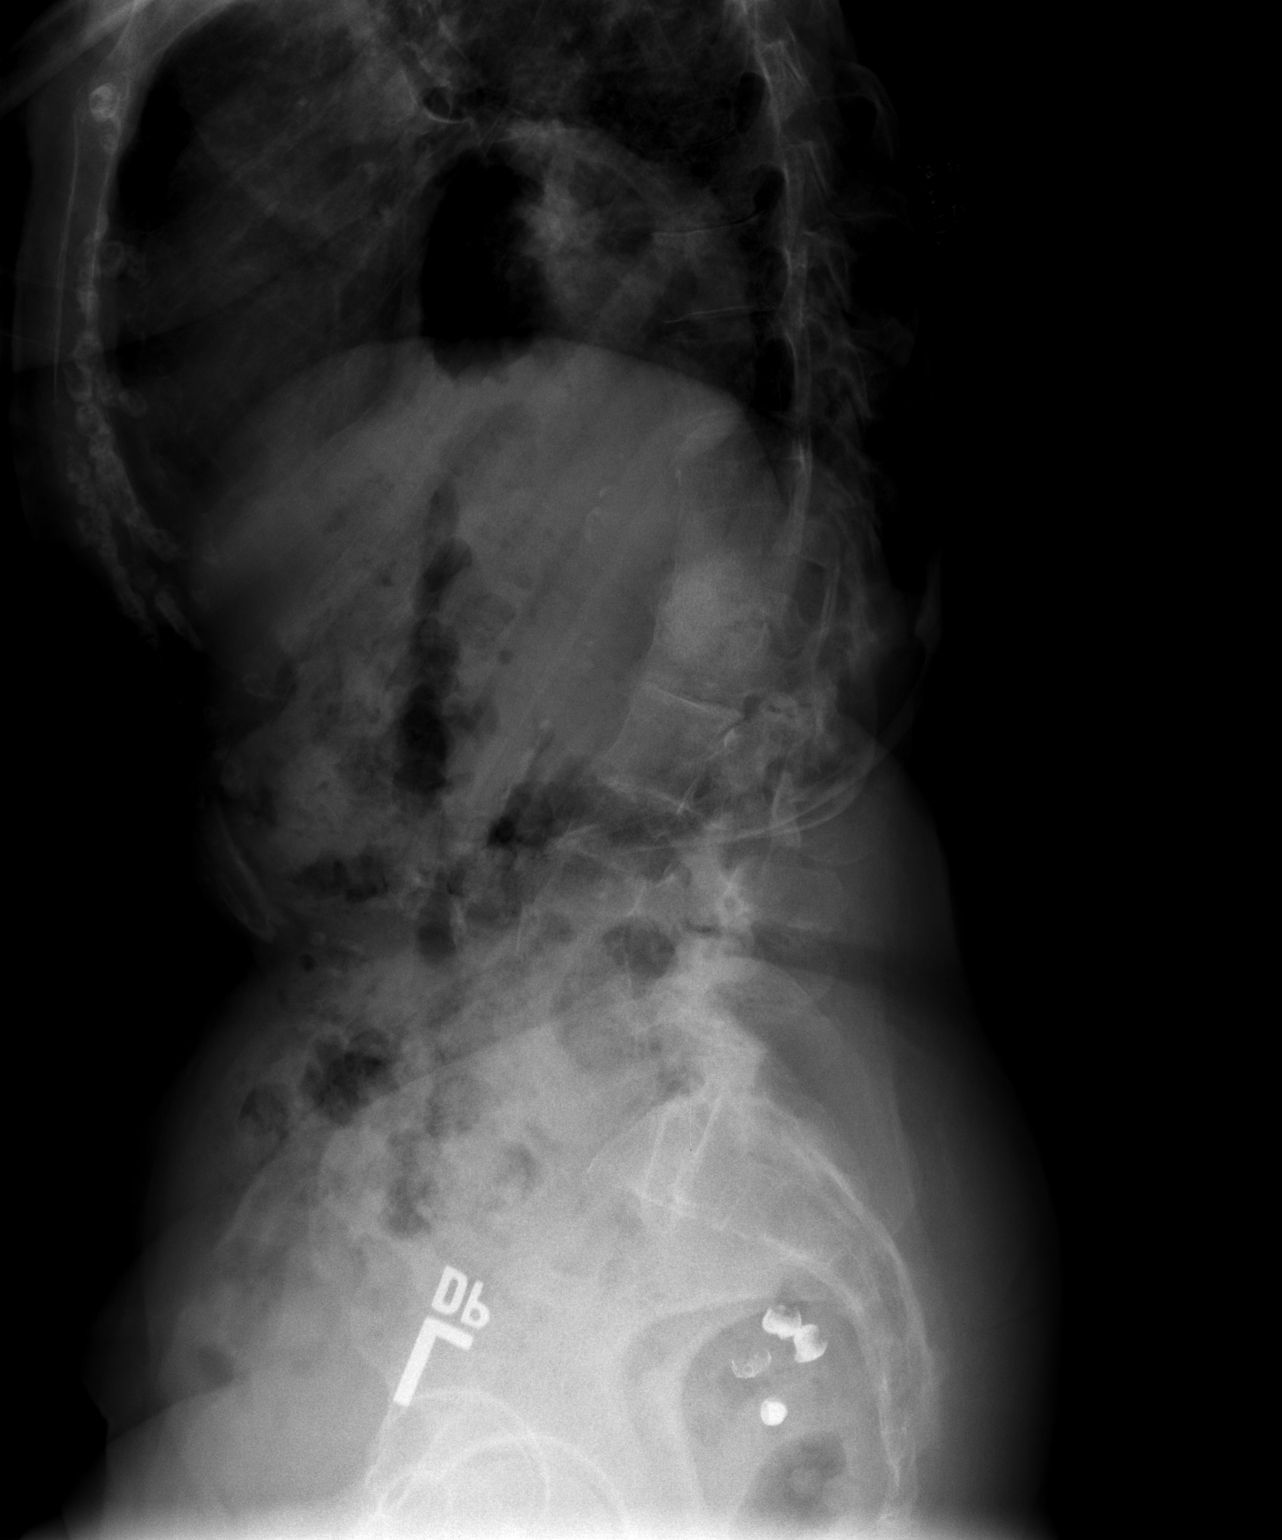

[t l-spine l5-s1 spot]
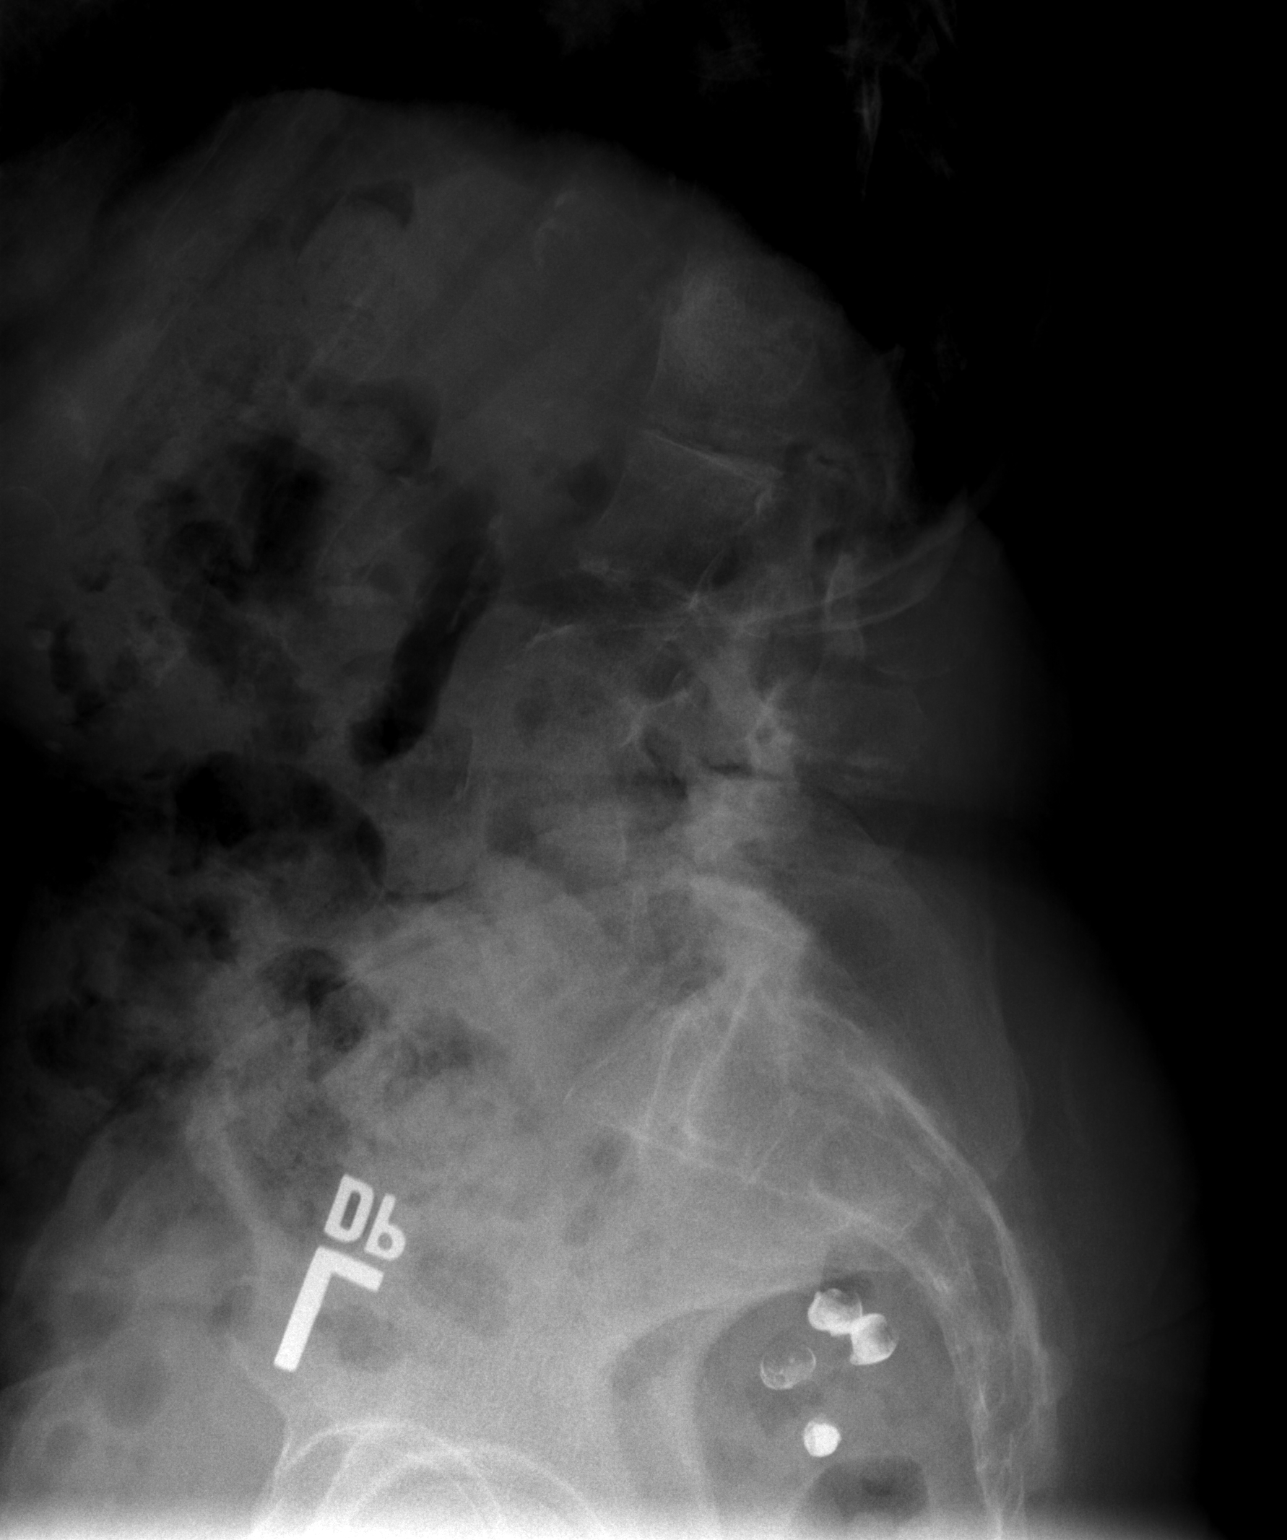

[3 of 3 positions shown; findings below may reference images not displayed]

FINDINGS: Five non-rib-bearing lumbar vertebra.

Diffuse osseous demineralization.

Marked levoconvex scoliosis apex L2.

Multilevel degenerative facet disease changes.

Multilevel disc space narrowing and minimal endplate spur formation.

SI joints preserved.

No definite acute fracture or bone destruction identified.

RIGHT lateral subluxation of L1 on L2.

Assessment of vertebral body heights at L2 and L3 is suboptimal due
to the severe degrees of scoliosis, degenerative change, and osseous
demineralization; suspect chronic RIGHT lateral compression
deformity of the L3 vertebral body.
IMPRESSION: Markedly progressive degenerative disc and facet disease changes of
the lumbar spine since 1333 with increased levoconvex scoliosis and
new RIGHT lateral of L1 subluxation on L2.

Osseous demineralization with probable RIGHT lateral compression
deformity of the L3 vertebral body new since previous exam.

## 2020-01-15 DIAGNOSIS — M4004 Postural kyphosis, thoracic region: Secondary | ICD-10-CM | POA: Diagnosis not present

## 2020-01-15 DIAGNOSIS — M546 Pain in thoracic spine: Secondary | ICD-10-CM | POA: Diagnosis not present

## 2020-01-15 DIAGNOSIS — M4124 Other idiopathic scoliosis, thoracic region: Secondary | ICD-10-CM | POA: Diagnosis not present

## 2020-02-03 DIAGNOSIS — M4004 Postural kyphosis, thoracic region: Secondary | ICD-10-CM | POA: Diagnosis not present

## 2020-02-03 DIAGNOSIS — M546 Pain in thoracic spine: Secondary | ICD-10-CM | POA: Diagnosis not present

## 2020-02-03 DIAGNOSIS — M4124 Other idiopathic scoliosis, thoracic region: Secondary | ICD-10-CM | POA: Diagnosis not present

## 2020-02-19 DIAGNOSIS — M546 Pain in thoracic spine: Secondary | ICD-10-CM | POA: Diagnosis not present

## 2020-02-19 DIAGNOSIS — M4124 Other idiopathic scoliosis, thoracic region: Secondary | ICD-10-CM | POA: Diagnosis not present

## 2020-02-19 DIAGNOSIS — M4004 Postural kyphosis, thoracic region: Secondary | ICD-10-CM | POA: Diagnosis not present

## 2020-03-01 DIAGNOSIS — K219 Gastro-esophageal reflux disease without esophagitis: Secondary | ICD-10-CM | POA: Diagnosis not present

## 2020-03-01 DIAGNOSIS — R1031 Right lower quadrant pain: Secondary | ICD-10-CM | POA: Diagnosis not present

## 2020-03-01 DIAGNOSIS — M810 Age-related osteoporosis without current pathological fracture: Secondary | ICD-10-CM | POA: Diagnosis not present

## 2020-03-01 DIAGNOSIS — R0789 Other chest pain: Secondary | ICD-10-CM | POA: Diagnosis not present

## 2020-03-03 ENCOUNTER — Ambulatory Visit: Payer: Medicare Other | Attending: Internal Medicine

## 2020-03-03 DIAGNOSIS — Z23 Encounter for immunization: Secondary | ICD-10-CM

## 2020-03-03 NOTE — Progress Notes (Signed)
° °  Covid-19 Vaccination Clinic  Name:  Jennifer Kirk    MRN: [id number] DOB: 04-24-1936  03/03/2020  Ms. Jennifer Kirk was observed post Covid-19 immunization for 15 minutes without incident. She was provided with Vaccine Information Sheet and instruction to access the V-Safe system.   Ms. Jennifer Kirk was instructed to call 911 with any severe reactions post vaccine:  Difficulty breathing   Swelling of face and throat   A fast heartbeat   A bad rash all over body   Dizziness and weakness

## 2020-03-05 ENCOUNTER — Other Ambulatory Visit (HOSPITAL_COMMUNITY): Payer: Self-pay | Admitting: Internal Medicine

## 2020-03-08 DIAGNOSIS — M546 Pain in thoracic spine: Secondary | ICD-10-CM | POA: Diagnosis not present

## 2020-03-08 DIAGNOSIS — M4124 Other idiopathic scoliosis, thoracic region: Secondary | ICD-10-CM | POA: Diagnosis not present

## 2020-03-08 DIAGNOSIS — M4004 Postural kyphosis, thoracic region: Secondary | ICD-10-CM | POA: Diagnosis not present

## 2020-03-23 DIAGNOSIS — M4004 Postural kyphosis, thoracic region: Secondary | ICD-10-CM | POA: Diagnosis not present

## 2020-03-23 DIAGNOSIS — M546 Pain in thoracic spine: Secondary | ICD-10-CM | POA: Diagnosis not present

## 2020-03-23 DIAGNOSIS — M4124 Other idiopathic scoliosis, thoracic region: Secondary | ICD-10-CM | POA: Diagnosis not present

## 2020-04-08 DIAGNOSIS — M546 Pain in thoracic spine: Secondary | ICD-10-CM | POA: Diagnosis not present

## 2020-04-08 DIAGNOSIS — M4124 Other idiopathic scoliosis, thoracic region: Secondary | ICD-10-CM | POA: Diagnosis not present

## 2020-04-08 DIAGNOSIS — M4004 Postural kyphosis, thoracic region: Secondary | ICD-10-CM | POA: Diagnosis not present

## 2020-04-19 DIAGNOSIS — M4125 Other idiopathic scoliosis, thoracolumbar region: Secondary | ICD-10-CM | POA: Diagnosis not present

## 2020-04-19 DIAGNOSIS — K219 Gastro-esophageal reflux disease without esophagitis: Secondary | ICD-10-CM | POA: Diagnosis not present

## 2020-04-19 DIAGNOSIS — Z1389 Encounter for screening for other disorder: Secondary | ICD-10-CM | POA: Diagnosis not present

## 2020-04-19 DIAGNOSIS — Z79899 Other long term (current) drug therapy: Secondary | ICD-10-CM | POA: Diagnosis not present

## 2020-04-19 DIAGNOSIS — Z0000 Encounter for general adult medical examination without abnormal findings: Secondary | ICD-10-CM | POA: Diagnosis not present

## 2020-04-19 DIAGNOSIS — E8889 Other specified metabolic disorders: Secondary | ICD-10-CM | POA: Diagnosis not present

## 2020-04-19 DIAGNOSIS — E7800 Pure hypercholesterolemia, unspecified: Secondary | ICD-10-CM | POA: Diagnosis not present

## 2020-04-19 DIAGNOSIS — D649 Anemia, unspecified: Secondary | ICD-10-CM | POA: Diagnosis not present

## 2020-04-19 DIAGNOSIS — M810 Age-related osteoporosis without current pathological fracture: Secondary | ICD-10-CM | POA: Diagnosis not present

## 2020-04-22 DIAGNOSIS — M546 Pain in thoracic spine: Secondary | ICD-10-CM | POA: Diagnosis not present

## 2020-04-22 DIAGNOSIS — M4124 Other idiopathic scoliosis, thoracic region: Secondary | ICD-10-CM | POA: Diagnosis not present

## 2020-04-22 DIAGNOSIS — M4004 Postural kyphosis, thoracic region: Secondary | ICD-10-CM | POA: Diagnosis not present

## 2020-04-23 ENCOUNTER — Encounter (HOSPITAL_COMMUNITY): Payer: Self-pay

## 2020-04-23 ENCOUNTER — Emergency Department (HOSPITAL_COMMUNITY)
Admission: EM | Admit: 2020-04-23 | Discharge: 2020-04-24 | Disposition: A | Discharge status: Home / Self Care | Payer: Medicare Other | Attending: Emergency Medicine | Admitting: Emergency Medicine

## 2020-04-23 ENCOUNTER — Telehealth: Payer: Self-pay | Admitting: Internal Medicine

## 2020-04-23 DIAGNOSIS — I10 Essential (primary) hypertension: Secondary | ICD-10-CM | POA: Diagnosis not present

## 2020-04-23 DIAGNOSIS — R52 Pain, unspecified: Secondary | ICD-10-CM | POA: Diagnosis not present

## 2020-04-23 DIAGNOSIS — R6883 Chills (without fever): Secondary | ICD-10-CM | POA: Insufficient documentation

## 2020-04-23 DIAGNOSIS — K219 Gastro-esophageal reflux disease without esophagitis: Secondary | ICD-10-CM | POA: Diagnosis not present

## 2020-04-23 DIAGNOSIS — R1084 Generalized abdominal pain: Secondary | ICD-10-CM | POA: Diagnosis not present

## 2020-04-23 DIAGNOSIS — R109 Unspecified abdominal pain: Secondary | ICD-10-CM | POA: Diagnosis not present

## 2020-04-23 DIAGNOSIS — K2970 Gastritis, unspecified, without bleeding: Secondary | ICD-10-CM | POA: Diagnosis not present

## 2020-04-23 DIAGNOSIS — R0902 Hypoxemia: Secondary | ICD-10-CM | POA: Diagnosis not present

## 2020-04-23 LAB — LIPASE, BLOOD: Lipase: 32 U/L (ref 11–51)

## 2020-04-23 LAB — COMPREHENSIVE METABOLIC PANEL
ALT: 20 U/L (ref 0–44)
AST: 28 U/L (ref 15–41)
Albumin: 4.3 g/dL (ref 3.5–5.0)
Alkaline Phosphatase: 43 U/L (ref 38–126)
Anion gap: 12 (ref 5–15)
BUN: 16 mg/dL (ref 8–23)
CO2: 24 mmol/L (ref 22–32)
Calcium: 9.9 mg/dL (ref 8.9–10.3)
Chloride: 97 mmol/L — ABNORMAL LOW (ref 98–111)
Creatinine, Ser: 0.86 mg/dL (ref 0.44–1.00)
GFR, Estimated: >60 mL/min (ref >60)
Glucose, Bld: 172 mg/dL — ABNORMAL HIGH (ref 70–99)
Potassium: 3.6 mmol/L (ref 3.5–5.1)
Sodium: 133 mmol/L — ABNORMAL LOW (ref 135–145)
Total Bilirubin: 0.6 mg/dL (ref 0.3–1.2)
Total Protein: 7 g/dL (ref 6.5–8.1)

## 2020-04-23 LAB — CBC
HCT: 37.9 % (ref 36.0–46.0)
Hemoglobin: 12.6 g/dL (ref 12.0–15.0)
MCH: 32.7 pg (ref 26.0–34.0)
MCHC: 33.2 g/dL (ref 30.0–36.0)
MCV: 98.4 fL (ref 80.0–100.0)
Platelets: 293 10*3/uL (ref 150–400)
RBC: 3.85 MIL/uL — ABNORMAL LOW (ref 3.87–5.11)
RDW: 12.1 % (ref 11.5–15.5)
WBC: 14.5 10*3/uL — ABNORMAL HIGH (ref 4.0–10.5)
nRBC: 0 % (ref 0.0–0.2)

## 2020-04-23 NOTE — Telephone Encounter (Signed)
Patient's daughter called this evening stating that her mother was having symptoms similar to a previous small bowel obstruction.  She is already on her way to the hospital via ambulance.

## 2020-04-23 NOTE — ED Triage Notes (Signed)
Pt arrives to ED w/ 7/10 generalized abdominal pain x 4 hours. Pt endorses nausea, denies vomiting or diarrhea.

## 2020-04-24 ENCOUNTER — Other Ambulatory Visit: Payer: Self-pay

## 2020-04-24 ENCOUNTER — Emergency Department (HOSPITAL_COMMUNITY): Payer: Medicare Other

## 2020-04-24 DIAGNOSIS — R1084 Generalized abdominal pain: Secondary | ICD-10-CM | POA: Diagnosis not present

## 2020-04-24 DIAGNOSIS — R109 Unspecified abdominal pain: Secondary | ICD-10-CM | POA: Diagnosis not present

## 2020-04-24 LAB — URINALYSIS, ROUTINE W REFLEX MICROSCOPIC
Bilirubin Urine: NEGATIVE
Glucose, UA: NEGATIVE mg/dL
Hgb urine dipstick: NEGATIVE
Ketones, ur: 5 mg/dL — AB
Leukocytes,Ua: NEGATIVE
Nitrite: NEGATIVE
Protein, ur: NEGATIVE mg/dL
Specific Gravity, Urine: 1.009 (ref 1.005–1.030)
pH: 6 (ref 5.0–8.0)

## 2020-04-24 MED ORDER — IOHEXOL 300 MG/ML  SOLN
100.0000 mL | Freq: Once | INTRAMUSCULAR | Status: AC | PRN
  Administered 2020-04-24: 100 mL via INTRAVENOUS

## 2020-04-24 NOTE — ED Provider Notes (Signed)
TIME SEEN: 1:39 AM  CHIEF COMPLAINT: Abdominal pain  HPI: Patient is an 84 year old female with history of previous bowel obstruction, previous BTL who presents to the emergency department with generalized abdominal pain, dry heaving, chills that started earlier today.  She states this feels exactly like when she had a previous bowel obstruction.  Reports the cramping, severe pain has improved but now her abdomen feels sore.  No vomiting.  She did have some diarrhea this morning around 9 AM but has not had a bowel movement or passed gas since.  States that she does take MiraLAX regularly due to chronic constipation.  She has been taking IBgard it was recommended by her gastroenterologist Dr. Carlean Purl today to help prevent another bowel obstruction.  ROS: See HPI Constitutional: no fever  Eyes: no drainage  ENT: no runny nose   Cardiovascular:  no chest pain  Resp: no SOB  GI: no vomiting GU: no dysuria Integumentary: no rash  Allergy: no hives  Musculoskeletal: no leg swelling  Neurological: no slurred speech ROS otherwise negative  PAST MEDICAL HISTORY/PAST SURGICAL HISTORY:  Past Medical History:  Diagnosis Date  . Anxiety   . Degenerative disc disease   . GERD (gastroesophageal reflux disease)   . Hiatal hernia 2010   GERD,esophageal dysmotility and paraesophageal hernia on esophogram  . Osteopetrosis    degenerative spine disease, lumbar  . Scoliosis     MEDICATIONS:  Prior to Admission medications   Medication Sig Start Date End Date Taking? Authorizing Provider  acetaminophen (TYLENOL) 500 MG tablet Take 1,000 mg by mouth every 6 (six) hours as needed for headache.    [provider]  ALPRAZolam Duanne Moron) 1 MG tablet Take 0.5 mg by mouth at bedtime.     [provider]  BIOTIN PO Take 1 capsule by mouth daily.    [provider]  Calcium Carbonate-Vitamin D (CALCIUM-D PO) Take 2 tablets by mouth 2 (two) times daily.    [provider]   denosumab (PROLIA) 60 MG/ML SOSY injection Inject 60 mg into the skin every 6 (six) months.    [provider]  famotidine (PEPCID) 40 MG tablet Take 40 mg by mouth daily before supper.     [provider]  Ginger, Zingiber officinalis, (GINGER PO) Take 1 capsule by mouth daily.    [provider]  hydrocortisone cream 1 % Apply 1 application topically daily as needed for itching (wound care).    [provider]  Misc Natural Products (APPLE CIDER VINEGAR DIET PO) Take by mouth 2 (two) times daily. 1 tablespoon daily , she adds ginger    [provider]  Multiple Vitamin (MULTIVITAMIN WITH MINERALS) TABS Take 1 tablet by mouth daily.    [provider]  NON FORMULARY Digest  Basic enzyme  1-2 w/meals    [provider]  Polyethyl Glycol-Propyl Glycol (SYSTANE OP) Place 2 drops into both eyes 2 (two) times daily.    [provider]  polyethylene glycol (MIRALAX / GLYCOLAX) packet Take 17 g by mouth daily. 05/22/18   Georgette Shell, MD  Probiotic Product (ALIGN PO) Take 1 tablet by mouth daily.    [provider]  vitamin C (ASCORBIC ACID) 500 MG tablet Take 500 mg by mouth daily.    [provider]  VITAMIN D-VITAMIN K PO 1 tablet. Takes one twice a week (Tues and Sat)    [provider]    ALLERGIES:  Allergies  Allergen Reactions  .  Codeine Nausea And Vomiting  . Penicillins Rash    Has patient had a PCN reaction causing immediate rash, facial/tongue/throat swelling, SOB or lightheadedness with hypotension: Yes Has patient had a PCN reaction causing severe rash involving mucus membranes or skin necrosis: No Has patient had a PCN reaction that required hospitalization: No Has patient had a PCN reaction occurring within the last 10 years: No If all of the above answers are "NO", then may proceed with Cephalosporin use.   . Mobic [Meloxicam] Other (See Comments)    Loss of blood per  patient  . Tramadol Nausea Only    Intolerance    SOCIAL HISTORY:  Social History   Tobacco Use  . Smoking status: Never Smoker  . Smokeless tobacco: Never Used  Substance Use Topics  . Alcohol use: Yes    Comment: small amount of wine     FAMILY HISTORY: Family History  Problem Relation Age of Onset  . Stroke Mother   . Dementia Father   . Heart disease Father   . Breast cancer Neg Hx     EXAM: BP (!) 143/83 (BP Location: Right Arm)   Pulse 99   Temp 99.3 F (37.4 C) (Oral)   Resp 16   Ht 4\' 10"  (1.473 m)   Wt 47.2 kg   SpO2 98%   BMI 21.74 kg/m  CONSTITUTIONAL: Alert and oriented and responds appropriately to questions. Well-appearing; well-nourished HEAD: Normocephalic EYES: Conjunctivae clear, pupils appear equal, EOM appear intact ENT: normal nose; moist mucous membranes NECK: Supple, normal ROM CARD: RRR; S1 and S2 appreciated; no murmurs, no clicks, no rubs, no gallops RESP: Normal chest excursion without splinting or tachypnea; breath sounds clear and equal bilaterally; no wheezes, no rhonchi, no rales, no hypoxia or respiratory distress, speaking full sentences ABD/GI: non-distended; soft, tender throughout the abdomen without guarding or rebound BACK:  The back appears normal EXT: Normal ROM in all joints; no deformity noted, no edema; no cyanosis SKIN: Normal color for age and race; warm; no rash on exposed skin NEURO: Moves all extremities equally PSYCH: The patient's mood and manner are appropriate.   MEDICAL DECISION MAKING: Patient here with abdominal pain, diffuse in nature.  States feels like previous bowel obstruction.  Not passing gas at this time or having bowel movements since 9 AM yesterday.  Labs show leukocytosis which may be reactive.  Differential also includes colitis, diverticulitis, appendicitis, cholecystitis, pancreatitis.  Otherwise labs reassuring.  Urine, CT of abdomen pelvis pending.  She declines any pain medicine at this  time.  ED PROGRESS: Labs show mild leukocytosis but no other acute abnormality.  Urine does not appear infected.  CT of abdomen pelvis shows a large stable gastric hernia.  She has diverticulosis without diverticulitis.  No other acute abnormality seen today.  Patient reports feeling better and I feel she is safe to be discharged home.  Patient and son comfortable with this plan.  She has outpatient follow-up with PCP and GI as needed.  Discussed return precautions.   At this time, I do not feel there is any life-threatening condition present. I have reviewed, interpreted and discussed all results (EKG, imaging, lab, urine as appropriate) and exam findings with patient/family. I have reviewed nursing notes and appropriate previous records.  I feel the patient is safe to be discharged home without further emergent workup and can continue workup as an outpatient as needed. Discussed usual and customary return precautions. Patient/family verbalize understanding and are comfortable with this plan.  Outpatient follow-up has been provided as needed. All questions have been answered.       Jennifer Kirk was evaluated in Emergency Department on 04/24/2020 for the symptoms described in the history of present illness. She was evaluated in the context of the global COVID-19 pandemic, which necessitated consideration that the patient might be at risk for infection with the SARS-CoV-2 virus that causes COVID-19. Institutional protocols and algorithms that pertain to the evaluation of patients at risk for COVID-19 are in a state of rapid change based on information released by regulatory bodies including the CDC and federal and state organizations. These policies and algorithms were followed during the patient's care in the ED.      Brodi Kari, Delice Bison, DO 04/24/20 [id number]

## 2020-04-24 NOTE — ED Notes (Signed)
Patient transported to CT 

## 2020-04-24 NOTE — Discharge Instructions (Addendum)
Your labs, urine and CT scan were very reassuring today.  Your CT scan did not show a bowel obstruction.  Please continue your medications as prescribed and follow-up with your PCP and GI physician as needed.  I recommend a bland diet for the next several days and increase fluid intake.

## 2020-04-24 NOTE — ED Notes (Signed)
Back to room at this time.

## 2020-04-26 ENCOUNTER — Telehealth: Payer: Self-pay | Admitting: Internal Medicine

## 2020-04-26 NOTE — Telephone Encounter (Signed)
Post ED abdominal pain. Patient still experiencing pain.  She will come in and see Alonza Bogus, [place] tomorrow 11/23

## 2020-04-27 ENCOUNTER — Encounter: Payer: Self-pay | Admitting: Gastroenterology

## 2020-04-27 ENCOUNTER — Ambulatory Visit (INDEPENDENT_AMBULATORY_CARE_PROVIDER_SITE_OTHER): Payer: Medicare Other | Admitting: Gastroenterology

## 2020-04-27 VITALS — BP 120/74 | HR 88 | Ht <= 58 in | Wt 107.0 lb

## 2020-04-27 DIAGNOSIS — Z8719 Personal history of other diseases of the digestive system: Secondary | ICD-10-CM | POA: Diagnosis not present

## 2020-04-27 DIAGNOSIS — R1084 Generalized abdominal pain: Secondary | ICD-10-CM | POA: Diagnosis not present

## 2020-04-27 MED ORDER — ONDANSETRON HCL 4 MG PO TABS: 4.0000 mg | ORAL_TABLET | Freq: Three times a day (TID) | ORAL | 1 refills | Status: DC | PRN

## 2020-04-27 MED ORDER — HYOSCYAMINE SULFATE SL 0.125 MG SL SUBL: 0.1250 mg | SUBLINGUAL_TABLET | SUBLINGUAL | 1 refills | Status: DC | PRN

## 2020-04-27 NOTE — Patient Instructions (Signed)
If you are age 82 or older, your body mass index should be between 23-30. Your Body mass index is 22.36 kg/m. If this is out of the aforementioned range listed, please consider follow up with your Primary Care Provider.  If you are age 6 or younger, your body mass index should be between 19-25. Your Body mass index is 22.36 kg/m. If this is out of the aformentioned range listed, please consider follow up with your Primary Care Provider.   We have sent the following medications to your pharmacy for you to pick up at your convenience: Levsin 0.125 mg every 6 hours as needed. Zofran 4 mg every 6 hours as needed.  Thank you for choosing me and [facility].  Alonza Bogus, PA-C

## 2020-04-27 NOTE — Progress Notes (Signed)
04/27/2020 Jennifer Kirk [id number] January 01, 1936   HISTORY OF PRESENT ILLNESS: This is a pleasant 84 year old female is a patient of Dr. Celesta Aver.  She is here today for follow-up of her recent ED visit.  She has a history of small bowel obstruction and also has a large paraesophageal hiatal hernia.  She says that 4 days ago she developed abdominal pain that felt very similar to previous small bowel obstructions that she has had in the past.  She had nausea and dry heaving.  She takes align probiotic and uses a lot of IBgard to help with bloating/gas and other GI symptoms.  Nonetheless, she went to the emergency department with evaluation as follows:  CT scan of the abdomen and pelvis with contrast on November 20 showed a very large but stable gastric hernia, sigmoid diverticulosis without evidence of diverticulitis, stable hepatic cysts, marked severity levoscoliosis of the lumbar spine, stable partially calcified area of soft tissue attenuation within the medial aspect of the right lower quadrant which may represent a mesenteric lymph node.  CBC, CMP, lipase were all normal except for an elevated white blood cell count of 14.5K.  She says that by the time she was actually seen and evaluated in the ER her symptoms had subsided.  Her last colonoscopy in September 2019 showed severe diverticulosis in the sigmoid colon with associated narrowing at a diverticular opening.  Her last EGD in September 2019 showed a tortuous esophagus and a 6 cm paraesophageal hiatal hernia.  Past Medical History:  Diagnosis Date  . Anxiety   . Degenerative disc disease   . GERD (gastroesophageal reflux disease)   . Hiatal hernia 2010   GERD,esophageal dysmotility and paraesophageal hernia on esophogram  . Osteopetrosis    degenerative spine disease, lumbar  . Scoliosis    Past Surgical History:  Procedure Laterality Date  . BIOPSY  02/22/2018   Procedure: BIOPSY;  Surgeon: Thornton Park, MD;   Location: [facility];  Service: Gastroenterology;;  . BREAST EXCISIONAL BIOPSY Bilateral 08/2002   path: fibrocystic changes.  sclerosing adenosis with microcalcification.  Marland Kitchen BREAST LUMPECTOMY    . COLONOSCOPY  10/2007   screening study.  Dr Owens Loffler.  small internal hemorrhoids, no polyps, no diverticulosis.    . COLONOSCOPY WITH PROPOFOL N/A 02/23/2018   Procedure: COLONOSCOPY WITH PROPOFOL;  Surgeon: [name], MD;  Location[facility] ENDOSCOPY;  Service: Endoscopy;  Laterality: N/A;  . ESOPHAGOGASTRODUODENOSCOPY N/A 02/22/2018   Procedure: ESOPHAGOGASTRODUODENOSCOPY (EGD);  Surgeon: Thornton Park, MD;  Location: [facility];  Service: Gastroenterology;  Laterality: N/A;    reports that she has never smoked. She has never used smokeless tobacco. She reports current alcohol use. She reports that she does not use drugs. family history includes Dementia in her father; Heart disease in her father; Stroke in her mother. Allergies  Allergen Reactions  . Codeine Nausea And Vomiting  . Penicillins Rash    Has patient had a PCN reaction causing immediate rash, facial/tongue/throat swelling, SOB or lightheadedness with hypotension: Yes Has patient had a PCN reaction causing severe rash involving mucus membranes or skin necrosis: No Has patient had a PCN reaction that required hospitalization: No Has patient had a PCN reaction occurring within the last 10 years: No If all of the above answers are "NO", then may proceed with Cephalosporin use.   . Mobic [Meloxicam] Other (See Comments)    Loss of blood per patient  . Tramadol Nausea Only    Intolerance  Outpatient Encounter Medications as of 04/27/2020  Medication Sig  . acetaminophen (TYLENOL) 500 MG tablet Take 1,000 mg by mouth every 6 (six) hours as needed for headache.  . ALPRAZolam (XANAX) 1 MG tablet Take 0.5 mg by mouth at bedtime.   Marland Kitchen BIOTIN PO Take 1 capsule by mouth daily.  . Calcium Carbonate-Vitamin D (CALCIUM-D  PO) Take 2 tablets by mouth 2 (two) times daily.  Marland Kitchen denosumab (PROLIA) 60 MG/ML SOSY injection Inject 60 mg into the skin every 6 (six) months.  . diclofenac Sodium (VOLTAREN) 1 % GEL Apply 1 application topically 3 (three) times daily as needed (pain).  . famotidine (PEPCID) 40 MG tablet Take 40 mg by mouth daily before supper. Alternates every 14 days with Protonix  . Ginger, Zingiber officinalis, (GINGER PO) Take 1 capsule by mouth daily.  . hydrocortisone cream 1 % Apply 1 application topically daily as needed for itching (wound care).  . Misc Natural Products (APPLE CIDER VINEGAR DIET PO) Take by mouth 2 (two) times daily. 1 tablespoon daily , she adds ginger  . Multiple Vitamin (MULTIVITAMIN WITH MINERALS) TABS Take 1 tablet by mouth daily.  . NON FORMULARY Take 1-2 capsules by mouth with breakfast, with lunch, and with evening meal. Digest  Basic enzyme  1-2 w/meals   . pantoprazole (PROTONIX) 40 MG tablet Take 40 mg by mouth daily. Alternates every 14 days with Famotidine   . Polyethyl Glycol-Propyl Glycol (SYSTANE OP) Place 2 drops into both eyes 2 (two) times daily as needed (dry eyes).   . polyethylene glycol (MIRALAX / GLYCOLAX) packet Take 17 g by mouth daily.  . Probiotic Product (ALIGN PO) Take 1 tablet by mouth daily.  . vitamin C (ASCORBIC ACID) 500 MG tablet Take 500 mg by mouth daily.  Marland Kitchen VITAMIN D-VITAMIN K PO Take 1 tablet by mouth 2 (two) times a week. Takes one twice a week (Tues and Sat)   No facility-administered encounter medications on file as of 04/27/2020.     REVIEW OF SYSTEMS  : All other systems reviewed and negative except where noted in the History of Present Illness.   PHYSICAL EXAM: BP 120/74   Pulse 88   Ht 4\' 10"  (1.473 m)   Wt 107 lb (48.5 kg)   BMI 22.36 kg/m  General: Well developed white female in no acute distress Head: Normocephalic and atraumatic Eyes:  Sclerae anicteric, conjunctiva pink. Ears: Normal auditory acuity Lungs: Clear  throughout to auscultation; no W/R/R. Heart: Regular rate and rhythm; no M/R/G. Abdomen: Soft, non-distended.  Normal bowel sounds.  Non-tender. Musculoskeletal:  Significant spinal scoliosis. Skin: No lesions on visible extremities Extremities: No edema  Neurological: Alert oriented x 4, grossly non-focal Psychological:  Alert and cooperative. Normal mood and affect  ASSESSMENT AND PLAN: *84 year old female here for recent ER follow-up of generalized abdominal pain.  She has a history of previous small bowel obstruction and said that this episode of pain felt similar to that.  She takes a lot of IBgard to help manage her GI symptoms.  She says that by the time she was actually seen in the ER her symptoms had subsided.  She also has a large paraesophageal hiatal hernia.  She alternates pantoprazole and Pepcid in regards to reflux issues.  She takes a probiotic in the form of align.  She would just like some other options and recommendations to try to help manage any recurrence of the symptoms so that she can avoid going to the emergency department if  possible.  We will send prescriptions for Levsin to use as needed for any abdominal pain/cramping and Zofran to use as needed for nausea.  She was advised that certainly if symptoms become very severe and do not resolve then she needs to proceed for evaluation   CC:  [name], MD

## 2020-05-19 ENCOUNTER — Encounter: Payer: Self-pay | Admitting: Gastroenterology

## 2020-05-19 DIAGNOSIS — Z8719 Personal history of other diseases of the digestive system: Secondary | ICD-10-CM | POA: Insufficient documentation

## 2020-05-19 DIAGNOSIS — R1084 Generalized abdominal pain: Secondary | ICD-10-CM | POA: Insufficient documentation

## 2020-05-20 DIAGNOSIS — M4124 Other idiopathic scoliosis, thoracic region: Secondary | ICD-10-CM | POA: Diagnosis not present

## 2020-05-20 DIAGNOSIS — M4004 Postural kyphosis, thoracic region: Secondary | ICD-10-CM | POA: Diagnosis not present

## 2020-05-20 DIAGNOSIS — M546 Pain in thoracic spine: Secondary | ICD-10-CM | POA: Diagnosis not present

## 2020-06-11 ENCOUNTER — Ambulatory Visit: Payer: Medicare Other | Admitting: Gastroenterology

## 2020-07-01 DIAGNOSIS — M4004 Postural kyphosis, thoracic region: Secondary | ICD-10-CM | POA: Diagnosis not present

## 2020-07-01 DIAGNOSIS — M4124 Other idiopathic scoliosis, thoracic region: Secondary | ICD-10-CM | POA: Diagnosis not present

## 2020-07-01 DIAGNOSIS — M546 Pain in thoracic spine: Secondary | ICD-10-CM | POA: Diagnosis not present

## 2020-07-15 DIAGNOSIS — M546 Pain in thoracic spine: Secondary | ICD-10-CM | POA: Diagnosis not present

## 2020-07-15 DIAGNOSIS — M4004 Postural kyphosis, thoracic region: Secondary | ICD-10-CM | POA: Diagnosis not present

## 2020-07-15 DIAGNOSIS — M4124 Other idiopathic scoliosis, thoracic region: Secondary | ICD-10-CM | POA: Diagnosis not present

## 2020-08-03 DIAGNOSIS — M4004 Postural kyphosis, thoracic region: Secondary | ICD-10-CM | POA: Diagnosis not present

## 2020-08-03 DIAGNOSIS — M4124 Other idiopathic scoliosis, thoracic region: Secondary | ICD-10-CM | POA: Diagnosis not present

## 2020-08-03 DIAGNOSIS — M546 Pain in thoracic spine: Secondary | ICD-10-CM | POA: Diagnosis not present

## 2020-08-31 DIAGNOSIS — M4004 Postural kyphosis, thoracic region: Secondary | ICD-10-CM | POA: Diagnosis not present

## 2020-08-31 DIAGNOSIS — M546 Pain in thoracic spine: Secondary | ICD-10-CM | POA: Diagnosis not present

## 2020-08-31 DIAGNOSIS — M4124 Other idiopathic scoliosis, thoracic region: Secondary | ICD-10-CM | POA: Diagnosis not present

## 2020-09-16 DIAGNOSIS — M4124 Other idiopathic scoliosis, thoracic region: Secondary | ICD-10-CM | POA: Diagnosis not present

## 2020-09-16 DIAGNOSIS — M546 Pain in thoracic spine: Secondary | ICD-10-CM | POA: Diagnosis not present

## 2020-09-16 DIAGNOSIS — M4004 Postural kyphosis, thoracic region: Secondary | ICD-10-CM | POA: Diagnosis not present

## 2020-09-28 DIAGNOSIS — L929 Granulomatous disorder of the skin and subcutaneous tissue, unspecified: Secondary | ICD-10-CM | POA: Diagnosis not present

## 2020-09-28 DIAGNOSIS — H9212 Otorrhea, left ear: Secondary | ICD-10-CM | POA: Diagnosis not present

## 2020-09-28 DIAGNOSIS — H938X1 Other specified disorders of right ear: Secondary | ICD-10-CM | POA: Diagnosis not present

## 2020-09-28 DIAGNOSIS — Z9622 Myringotomy tube(s) status: Secondary | ICD-10-CM | POA: Diagnosis not present

## 2020-10-11 DIAGNOSIS — M4004 Postural kyphosis, thoracic region: Secondary | ICD-10-CM | POA: Diagnosis not present

## 2020-10-11 DIAGNOSIS — M546 Pain in thoracic spine: Secondary | ICD-10-CM | POA: Diagnosis not present

## 2020-10-11 DIAGNOSIS — M4124 Other idiopathic scoliosis, thoracic region: Secondary | ICD-10-CM | POA: Diagnosis not present

## 2020-10-12 DIAGNOSIS — Z961 Presence of intraocular lens: Secondary | ICD-10-CM | POA: Diagnosis not present

## 2020-10-12 DIAGNOSIS — H52203 Unspecified astigmatism, bilateral: Secondary | ICD-10-CM | POA: Diagnosis not present

## 2020-11-04 DIAGNOSIS — M4124 Other idiopathic scoliosis, thoracic region: Secondary | ICD-10-CM | POA: Diagnosis not present

## 2020-11-04 DIAGNOSIS — M4004 Postural kyphosis, thoracic region: Secondary | ICD-10-CM | POA: Diagnosis not present

## 2020-11-04 DIAGNOSIS — M546 Pain in thoracic spine: Secondary | ICD-10-CM | POA: Diagnosis not present

## 2020-11-17 DIAGNOSIS — Z79899 Other long term (current) drug therapy: Secondary | ICD-10-CM | POA: Diagnosis not present

## 2020-11-17 DIAGNOSIS — K449 Diaphragmatic hernia without obstruction or gangrene: Secondary | ICD-10-CM | POA: Diagnosis not present

## 2020-11-17 DIAGNOSIS — M810 Age-related osteoporosis without current pathological fracture: Secondary | ICD-10-CM | POA: Diagnosis not present

## 2020-11-17 DIAGNOSIS — D500 Iron deficiency anemia secondary to blood loss (chronic): Secondary | ICD-10-CM | POA: Diagnosis not present

## 2020-11-17 DIAGNOSIS — K219 Gastro-esophageal reflux disease without esophagitis: Secondary | ICD-10-CM | POA: Diagnosis not present

## 2020-11-18 DIAGNOSIS — M4004 Postural kyphosis, thoracic region: Secondary | ICD-10-CM | POA: Diagnosis not present

## 2020-11-18 DIAGNOSIS — M4124 Other idiopathic scoliosis, thoracic region: Secondary | ICD-10-CM | POA: Diagnosis not present

## 2020-11-18 DIAGNOSIS — M546 Pain in thoracic spine: Secondary | ICD-10-CM | POA: Diagnosis not present

## 2020-12-02 DIAGNOSIS — M4004 Postural kyphosis, thoracic region: Secondary | ICD-10-CM | POA: Diagnosis not present

## 2020-12-02 DIAGNOSIS — M546 Pain in thoracic spine: Secondary | ICD-10-CM | POA: Diagnosis not present

## 2020-12-02 DIAGNOSIS — M4124 Other idiopathic scoliosis, thoracic region: Secondary | ICD-10-CM | POA: Diagnosis not present

## 2020-12-12 DIAGNOSIS — Z20822 Contact with and (suspected) exposure to covid-19: Secondary | ICD-10-CM | POA: Diagnosis not present

## 2020-12-13 DIAGNOSIS — M810 Age-related osteoporosis without current pathological fracture: Secondary | ICD-10-CM | POA: Diagnosis not present

## 2020-12-13 DIAGNOSIS — D500 Iron deficiency anemia secondary to blood loss (chronic): Secondary | ICD-10-CM | POA: Diagnosis not present

## 2020-12-16 DIAGNOSIS — M4004 Postural kyphosis, thoracic region: Secondary | ICD-10-CM | POA: Diagnosis not present

## 2020-12-16 DIAGNOSIS — M4124 Other idiopathic scoliosis, thoracic region: Secondary | ICD-10-CM | POA: Diagnosis not present

## 2020-12-16 DIAGNOSIS — M546 Pain in thoracic spine: Secondary | ICD-10-CM | POA: Diagnosis not present

## 2021-01-04 DIAGNOSIS — M4124 Other idiopathic scoliosis, thoracic region: Secondary | ICD-10-CM | POA: Diagnosis not present

## 2021-01-04 DIAGNOSIS — M4004 Postural kyphosis, thoracic region: Secondary | ICD-10-CM | POA: Diagnosis not present

## 2021-01-04 DIAGNOSIS — M546 Pain in thoracic spine: Secondary | ICD-10-CM | POA: Diagnosis not present

## 2021-01-18 DIAGNOSIS — M546 Pain in thoracic spine: Secondary | ICD-10-CM | POA: Diagnosis not present

## 2021-01-18 DIAGNOSIS — M4004 Postural kyphosis, thoracic region: Secondary | ICD-10-CM | POA: Diagnosis not present

## 2021-01-18 DIAGNOSIS — M4124 Other idiopathic scoliosis, thoracic region: Secondary | ICD-10-CM | POA: Diagnosis not present

## 2021-01-25 ENCOUNTER — Telehealth: Payer: Self-pay | Admitting: Gastroenterology

## 2021-01-25 ENCOUNTER — Other Ambulatory Visit: Payer: Self-pay

## 2021-01-25 NOTE — Telephone Encounter (Signed)
Inbound call from patient. Needs to speak with nurse about her abd pain, spasm, trapped gas, and constipation. Patient believe she have an obstruction. Wants to discuss the medication Hyoscyamine about how far in between the as needed can she use the medication? Can mix OTC medications as well?

## 2021-01-25 NOTE — Telephone Encounter (Signed)
Pt was questioning how often she should take the Hyoscyamine due to the fact that her prescription says as needed. Pt was informed to take medication every 4 hours as needed. Pt was also questioning whether she could take the zofran with the hyoscyamine. Pt was informed that it was fine to take the two. Pt stated that she is having BMs today. Pt stated that she is feeling better throughout the day. Pt knows to call back if she has any further issues.

## 2021-01-27 NOTE — Telephone Encounter (Signed)
Patient called states she has not had a BM since yesterday and is seeking further advise. Said she has Dulcolax stool softener but I snot sure if that is something she should take.

## 2021-01-27 NOTE — Telephone Encounter (Signed)
Pt stated that she had a BM yesterday along with one this AM as well. Pt stated that the BM was small this AM. Pt stated that she has a commitment this weekend and wants to make that  she will be able to attend.  Pt encouraged to take 1 to 3 doses of miralax a day as needed.

## 2021-02-17 DIAGNOSIS — M4004 Postural kyphosis, thoracic region: Secondary | ICD-10-CM | POA: Diagnosis not present

## 2021-02-17 DIAGNOSIS — M546 Pain in thoracic spine: Secondary | ICD-10-CM | POA: Diagnosis not present

## 2021-02-17 DIAGNOSIS — M4124 Other idiopathic scoliosis, thoracic region: Secondary | ICD-10-CM | POA: Diagnosis not present

## 2021-03-01 DIAGNOSIS — M4004 Postural kyphosis, thoracic region: Secondary | ICD-10-CM | POA: Diagnosis not present

## 2021-03-01 DIAGNOSIS — M546 Pain in thoracic spine: Secondary | ICD-10-CM | POA: Diagnosis not present

## 2021-03-01 DIAGNOSIS — M4124 Other idiopathic scoliosis, thoracic region: Secondary | ICD-10-CM | POA: Diagnosis not present

## 2021-03-14 DIAGNOSIS — M546 Pain in thoracic spine: Secondary | ICD-10-CM | POA: Diagnosis not present

## 2021-03-14 DIAGNOSIS — M4124 Other idiopathic scoliosis, thoracic region: Secondary | ICD-10-CM | POA: Diagnosis not present

## 2021-03-14 DIAGNOSIS — M4004 Postural kyphosis, thoracic region: Secondary | ICD-10-CM | POA: Diagnosis not present

## 2021-04-08 DIAGNOSIS — M4124 Other idiopathic scoliosis, thoracic region: Secondary | ICD-10-CM | POA: Diagnosis not present

## 2021-04-08 DIAGNOSIS — M546 Pain in thoracic spine: Secondary | ICD-10-CM | POA: Diagnosis not present

## 2021-04-08 DIAGNOSIS — M4004 Postural kyphosis, thoracic region: Secondary | ICD-10-CM | POA: Diagnosis not present

## 2021-04-20 DIAGNOSIS — K219 Gastro-esophageal reflux disease without esophagitis: Secondary | ICD-10-CM | POA: Diagnosis not present

## 2021-04-20 DIAGNOSIS — M4125 Other idiopathic scoliosis, thoracolumbar region: Secondary | ICD-10-CM | POA: Diagnosis not present

## 2021-04-20 DIAGNOSIS — E7800 Pure hypercholesterolemia, unspecified: Secondary | ICD-10-CM | POA: Diagnosis not present

## 2021-04-20 DIAGNOSIS — Z1389 Encounter for screening for other disorder: Secondary | ICD-10-CM | POA: Diagnosis not present

## 2021-04-20 DIAGNOSIS — Z79899 Other long term (current) drug therapy: Secondary | ICD-10-CM | POA: Diagnosis not present

## 2021-04-20 DIAGNOSIS — K909 Intestinal malabsorption, unspecified: Secondary | ICD-10-CM | POA: Diagnosis not present

## 2021-04-20 DIAGNOSIS — Z0000 Encounter for general adult medical examination without abnormal findings: Secondary | ICD-10-CM | POA: Diagnosis not present

## 2021-04-20 DIAGNOSIS — D500 Iron deficiency anemia secondary to blood loss (chronic): Secondary | ICD-10-CM | POA: Diagnosis not present

## 2021-04-20 DIAGNOSIS — Z23 Encounter for immunization: Secondary | ICD-10-CM | POA: Diagnosis not present

## 2021-04-20 DIAGNOSIS — I700 Atherosclerosis of aorta: Secondary | ICD-10-CM | POA: Diagnosis not present

## 2021-04-21 DIAGNOSIS — M546 Pain in thoracic spine: Secondary | ICD-10-CM | POA: Diagnosis not present

## 2021-04-21 DIAGNOSIS — M4004 Postural kyphosis, thoracic region: Secondary | ICD-10-CM | POA: Diagnosis not present

## 2021-04-21 DIAGNOSIS — M4124 Other idiopathic scoliosis, thoracic region: Secondary | ICD-10-CM | POA: Diagnosis not present

## 2021-05-05 DIAGNOSIS — M546 Pain in thoracic spine: Secondary | ICD-10-CM | POA: Diagnosis not present

## 2021-05-05 DIAGNOSIS — M4004 Postural kyphosis, thoracic region: Secondary | ICD-10-CM | POA: Diagnosis not present

## 2021-05-05 DIAGNOSIS — M4124 Other idiopathic scoliosis, thoracic region: Secondary | ICD-10-CM | POA: Diagnosis not present

## 2021-05-12 DIAGNOSIS — Z20822 Contact with and (suspected) exposure to covid-19: Secondary | ICD-10-CM | POA: Diagnosis not present

## 2021-06-10 DIAGNOSIS — M4124 Other idiopathic scoliosis, thoracic region: Secondary | ICD-10-CM | POA: Diagnosis not present

## 2021-06-10 DIAGNOSIS — M4004 Postural kyphosis, thoracic region: Secondary | ICD-10-CM | POA: Diagnosis not present

## 2021-06-10 DIAGNOSIS — M546 Pain in thoracic spine: Secondary | ICD-10-CM | POA: Diagnosis not present

## 2021-06-28 DIAGNOSIS — R42 Dizziness and giddiness: Secondary | ICD-10-CM | POA: Diagnosis not present

## 2021-06-28 DIAGNOSIS — R5383 Other fatigue: Secondary | ICD-10-CM | POA: Diagnosis not present

## 2021-06-28 DIAGNOSIS — R1033 Periumbilical pain: Secondary | ICD-10-CM | POA: Diagnosis not present

## 2021-06-28 DIAGNOSIS — Z79899 Other long term (current) drug therapy: Secondary | ICD-10-CM | POA: Diagnosis not present

## 2021-06-28 DIAGNOSIS — F411 Generalized anxiety disorder: Secondary | ICD-10-CM | POA: Diagnosis not present

## 2021-07-07 DIAGNOSIS — M4124 Other idiopathic scoliosis, thoracic region: Secondary | ICD-10-CM | POA: Diagnosis not present

## 2021-07-07 DIAGNOSIS — M546 Pain in thoracic spine: Secondary | ICD-10-CM | POA: Diagnosis not present

## 2021-07-07 DIAGNOSIS — M4004 Postural kyphosis, thoracic region: Secondary | ICD-10-CM | POA: Diagnosis not present

## 2021-07-15 DIAGNOSIS — M4004 Postural kyphosis, thoracic region: Secondary | ICD-10-CM | POA: Diagnosis not present

## 2021-07-15 DIAGNOSIS — M546 Pain in thoracic spine: Secondary | ICD-10-CM | POA: Diagnosis not present

## 2021-07-15 DIAGNOSIS — M4124 Other idiopathic scoliosis, thoracic region: Secondary | ICD-10-CM | POA: Diagnosis not present

## 2021-07-27 DIAGNOSIS — M4004 Postural kyphosis, thoracic region: Secondary | ICD-10-CM | POA: Diagnosis not present

## 2021-07-27 DIAGNOSIS — M546 Pain in thoracic spine: Secondary | ICD-10-CM | POA: Diagnosis not present

## 2021-07-27 DIAGNOSIS — M4124 Other idiopathic scoliosis, thoracic region: Secondary | ICD-10-CM | POA: Diagnosis not present

## 2021-08-01 DIAGNOSIS — Z20822 Contact with and (suspected) exposure to covid-19: Secondary | ICD-10-CM | POA: Diagnosis not present

## 2021-08-11 DIAGNOSIS — F411 Generalized anxiety disorder: Secondary | ICD-10-CM | POA: Diagnosis not present

## 2021-08-11 DIAGNOSIS — M79661 Pain in right lower leg: Secondary | ICD-10-CM | POA: Diagnosis not present

## 2021-08-11 DIAGNOSIS — M6281 Muscle weakness (generalized): Secondary | ICD-10-CM | POA: Diagnosis not present

## 2021-08-19 DIAGNOSIS — M4124 Other idiopathic scoliosis, thoracic region: Secondary | ICD-10-CM | POA: Diagnosis not present

## 2021-08-19 DIAGNOSIS — M4004 Postural kyphosis, thoracic region: Secondary | ICD-10-CM | POA: Diagnosis not present

## 2021-08-19 DIAGNOSIS — M546 Pain in thoracic spine: Secondary | ICD-10-CM | POA: Diagnosis not present

## 2021-09-02 DIAGNOSIS — Z20822 Contact with and (suspected) exposure to covid-19: Secondary | ICD-10-CM | POA: Diagnosis not present

## 2021-09-06 DIAGNOSIS — M546 Pain in thoracic spine: Secondary | ICD-10-CM | POA: Diagnosis not present

## 2021-09-06 DIAGNOSIS — M4004 Postural kyphosis, thoracic region: Secondary | ICD-10-CM | POA: Diagnosis not present

## 2021-09-06 DIAGNOSIS — Z20822 Contact with and (suspected) exposure to covid-19: Secondary | ICD-10-CM | POA: Diagnosis not present

## 2021-09-06 DIAGNOSIS — M4124 Other idiopathic scoliosis, thoracic region: Secondary | ICD-10-CM | POA: Diagnosis not present

## 2021-09-08 DIAGNOSIS — Z20822 Contact with and (suspected) exposure to covid-19: Secondary | ICD-10-CM | POA: Diagnosis not present

## 2021-09-19 DIAGNOSIS — Z20822 Contact with and (suspected) exposure to covid-19: Secondary | ICD-10-CM | POA: Diagnosis not present

## 2021-09-20 DIAGNOSIS — M4124 Other idiopathic scoliosis, thoracic region: Secondary | ICD-10-CM | POA: Diagnosis not present

## 2021-09-20 DIAGNOSIS — M546 Pain in thoracic spine: Secondary | ICD-10-CM | POA: Diagnosis not present

## 2021-09-20 DIAGNOSIS — M4004 Postural kyphosis, thoracic region: Secondary | ICD-10-CM | POA: Diagnosis not present

## 2021-10-08 DIAGNOSIS — Z20822 Contact with and (suspected) exposure to covid-19: Secondary | ICD-10-CM | POA: Diagnosis not present

## 2021-10-10 DIAGNOSIS — Z20822 Contact with and (suspected) exposure to covid-19: Secondary | ICD-10-CM | POA: Diagnosis not present

## 2021-10-10 DIAGNOSIS — M810 Age-related osteoporosis without current pathological fracture: Secondary | ICD-10-CM | POA: Diagnosis not present

## 2021-10-12 DIAGNOSIS — M4124 Other idiopathic scoliosis, thoracic region: Secondary | ICD-10-CM | POA: Diagnosis not present

## 2021-10-12 DIAGNOSIS — M546 Pain in thoracic spine: Secondary | ICD-10-CM | POA: Diagnosis not present

## 2021-10-12 DIAGNOSIS — M4004 Postural kyphosis, thoracic region: Secondary | ICD-10-CM | POA: Diagnosis not present

## 2021-10-28 DIAGNOSIS — M4124 Other idiopathic scoliosis, thoracic region: Secondary | ICD-10-CM | POA: Diagnosis not present

## 2021-10-28 DIAGNOSIS — M546 Pain in thoracic spine: Secondary | ICD-10-CM | POA: Diagnosis not present

## 2021-10-28 DIAGNOSIS — M4004 Postural kyphosis, thoracic region: Secondary | ICD-10-CM | POA: Diagnosis not present

## 2021-11-11 ENCOUNTER — Telehealth: Payer: Self-pay | Admitting: Gastroenterology

## 2021-11-11 NOTE — Telephone Encounter (Signed)
I spoke with the pt and she tells me that she has some abd cramping.  She has a history of abd pain and was seen last by [facility] 04/2020.  She has been taking Ibgard and hyoscyamine.  She says this helps but she is going away for a graduation next week and wants to make sure she will be able to attend.  She has been scheduled to see Anderson Malta on 6/26-soonest appt with anyone.  She will take the hyoscyamine  as soon as she feels discomfort and call back if she can not make it until the appt with Anderson Malta.

## 2021-11-11 NOTE — Telephone Encounter (Signed)
Inbound call from patient stating that she is going out of town next week to go to her granddaughters graduation. Patient stated that she is having pain and she has been using the IB guard and taking her medication to help with nausea. Patient is requesting a call back to discuss. Please advise.   Home: [place]  Mobile: [phone]

## 2021-11-14 ENCOUNTER — Telehealth: Payer: Self-pay | Admitting: Gastroenterology

## 2021-11-14 ENCOUNTER — Other Ambulatory Visit: Payer: Self-pay

## 2021-11-14 ENCOUNTER — Other Ambulatory Visit: Payer: Self-pay | Admitting: Internal Medicine

## 2021-11-14 DIAGNOSIS — R109 Unspecified abdominal pain: Secondary | ICD-10-CM | POA: Diagnosis not present

## 2021-11-14 DIAGNOSIS — K219 Gastro-esophageal reflux disease without esophagitis: Secondary | ICD-10-CM | POA: Diagnosis not present

## 2021-11-14 DIAGNOSIS — R140 Abdominal distension (gaseous): Secondary | ICD-10-CM

## 2021-11-14 MED ORDER — HYOSCYAMINE SULFATE SL 0.125 MG SL SUBL: 0.1250 mg | SUBLINGUAL_TABLET | SUBLINGUAL | 1 refills | Status: DC | PRN

## 2021-11-14 NOTE — Telephone Encounter (Addendum)
Inbound call from patient stating she needs a refill for Hyoscyamine. Please advise.

## 2021-11-14 NOTE — Telephone Encounter (Signed)
Patient has an appointment with Ellouise Newer 11/28/21 . Refilled Hyoscyamine with just enough to make it to her appointment.

## 2021-11-15 ENCOUNTER — Ambulatory Visit
Admission: RE | Admit: 2021-11-15 | Discharge: 2021-11-15 | Disposition: A | Discharge status: Home / Self Care | Payer: Medicare Other | Source: Ambulatory Visit | Attending: Internal Medicine | Admitting: Internal Medicine

## 2021-11-15 ENCOUNTER — Other Ambulatory Visit: Payer: Self-pay | Admitting: Internal Medicine

## 2021-11-15 DIAGNOSIS — R109 Unspecified abdominal pain: Secondary | ICD-10-CM

## 2021-11-15 DIAGNOSIS — K7689 Other specified diseases of liver: Secondary | ICD-10-CM | POA: Diagnosis not present

## 2021-11-15 DIAGNOSIS — K439 Ventral hernia without obstruction or gangrene: Secondary | ICD-10-CM | POA: Diagnosis not present

## 2021-11-15 DIAGNOSIS — K5730 Diverticulosis of large intestine without perforation or abscess without bleeding: Secondary | ICD-10-CM | POA: Diagnosis not present

## 2021-11-15 DIAGNOSIS — K449 Diaphragmatic hernia without obstruction or gangrene: Secondary | ICD-10-CM | POA: Diagnosis not present

## 2021-11-15 MED ORDER — IOPAMIDOL (ISOVUE-300) INJECTION 61%
75.0000 mL | Freq: Once | INTRAVENOUS | Status: AC | PRN
  Administered 2021-11-15: 75 mL via INTRAVENOUS

## 2021-11-18 DIAGNOSIS — R1031 Right lower quadrant pain: Secondary | ICD-10-CM | POA: Diagnosis not present

## 2021-11-18 DIAGNOSIS — R1310 Dysphagia, unspecified: Secondary | ICD-10-CM | POA: Diagnosis not present

## 2021-11-28 ENCOUNTER — Ambulatory Visit (INDEPENDENT_AMBULATORY_CARE_PROVIDER_SITE_OTHER): Payer: Medicare Other | Admitting: Physician Assistant

## 2021-11-28 ENCOUNTER — Encounter: Payer: Self-pay | Admitting: Physician Assistant

## 2021-11-28 VITALS — BP 116/60 | HR 84 | Ht <= 58 in | Wt 102.5 lb

## 2021-11-28 DIAGNOSIS — Z8719 Personal history of other diseases of the digestive system: Secondary | ICD-10-CM | POA: Diagnosis not present

## 2021-11-28 DIAGNOSIS — R1310 Dysphagia, unspecified: Secondary | ICD-10-CM

## 2021-11-28 DIAGNOSIS — K582 Mixed irritable bowel syndrome: Secondary | ICD-10-CM

## 2021-11-28 NOTE — Progress Notes (Signed)
Chief Complaint: Abdominal pain and dysphagia  HPI:  Jennifer Kirk is an 86 year old female, known to Jennifer. Leone Kirk, with a past medical history as listed below including reflux, who was referred to me by Jennifer Laughter, Jennifer Kirk for a complaint of abdominal pain and dysphagia.      02/23/2018 EGD and colonoscopy for anemia.  Colonoscopy was severe diverticulosis in the sigmoid colon and narrowing of the colon in association with diverticular openings.  No cause of anemia was found.  EGD with tortuous esophagus and 6 cm paraesophageal hernia without Cameron erosions.    04/27/2020 office visit with Jennifer Kirk for generalized abdominal pain.  At that time gave her Hyoscyamine and Zofran to help manage her symptoms.    11/15/2021 CT of the abdomen pelvis with contrast with stable large gastric hernia no other acute process.    11/18/2021 patient saw PCP for follow-up of GI issues.  GERD previously been concerned that she was developing another small bowel obstruction with history of that in January 2020 with lower abdominal pain, nausea and occasional emesis.  CT had been ordered and was overall stable with no evidence of bowel obstruction.  CMP was normal and CBC stable with normal sed rate.  She was advised to hold her iron supplement and stay hydrated and double up on Hyoscyamine if needed as well as continue MiraLAX twice a day and Zofran for nausea.  That time pantoprazole been increased to 40 mg daily.  At follow-up her symptoms are much better.  That time discussed some dysphagia especially with certain encapsulated pills.  At that time she was told to decrease back down to 17 g of MiraLAX a day and use Hyoscyamine only as needed.  Continued on Protonix 40 mg daily for another couple of weeks and continue IBgard.  She was told to reach out to [name] in regards to dysphagia.    Today, the patient presents to clinic and tells me that she had a terrible flareup of her symptoms back at the beginning of June.  She could  not get in with [name] but she was seen by her PCP on 11/14/2021 with notes as above.  Apparently he doubled the dosage of hyoscyamine she was taking two 0.125 mg tabs every 4-6 hours for about 3 days and a peppermint oil supplement as well as Zofran on a schedule.  He also changed her Prilosec to 20 mg to Protonix 40 mg for 8 days and continued her on Pepcid 40 mg twice daily.  Also told her to double her MiraLAX dosage in the evening for 3 days in order to CT as above.  She explains to me that within 3 days her symptoms were much better and since then she has gone back to her regular dosing of all of these medications.  Tells me these severe symptoms seem to occur every couple of years.  There is always some concern about small bowel obstruction as this is occurred for her before.  Tells me that she will have occasional increases in symptoms but manages these by taking more medicine.      Currently her biggest complaint is of dysphagia.  Tells me this has been going on for many many years and it has always been said it is due to her hiatal hernia but recently it seems to be worse and she cannot swallow any capsules at all.  She often takes her medicines with Jennifer Kirk and this helps to move things along.  Foods seem to go  down fine it is really just pills that she has trouble with.  Apparently she is supposed to be on Calcium but cannot take these pills due to the dysphagia.  Also, she is on Famotidine 40 twice daily and will occasionally use Prilosec 20 mg if she feels the symptoms are worsened and this will help but she does not want be on that medicine all the time due to her osteoporosis.    Denies fever, chills or weight loss.  Past Medical History:  Diagnosis Date   Anxiety    Degenerative disc disease    GERD (gastroesophageal reflux disease)    Hiatal hernia 2010   GERD,esophageal dysmotility and paraesophageal hernia on esophogram   Osteopetrosis    degenerative spine disease, lumbar   Scoliosis      Past Surgical History:  Procedure Laterality Date   BIOPSY  02/22/2018   Procedure: BIOPSY;  Surgeon: Jennifer Danas, Jennifer Kirk;  Location: [facility] ENDOSCOPY;  Service: Gastroenterology;;   BREAST EXCISIONAL BIOPSY Bilateral 08/2002   path: fibrocystic changes.  sclerosing adenosis with microcalcification.   BREAST LUMPECTOMY     COLONOSCOPY  10/2007   screening study.  Jennifer Kirk.  small internal hemorrhoids, no polyps, no diverticulosis.     COLONOSCOPY WITH PROPOFOL N/A 02/23/2018   Procedure: COLONOSCOPY WITH PROPOFOL;  Surgeon: Jennifer Boop, Jennifer Kirk;  Location: [facility] ENDOSCOPY;  Service: Endoscopy;  Laterality: N/A;   ESOPHAGOGASTRODUODENOSCOPY N/A 02/22/2018   Procedure: ESOPHAGOGASTRODUODENOSCOPY (EGD);  Surgeon: Jennifer Danas, Jennifer Kirk;  Location: [facility] ENDOSCOPY;  Service: Gastroenterology;  Laterality: N/A;    Current Outpatient Medications  Medication Sig Dispense Refill   acetaminophen (TYLENOL) 500 MG tablet Take 1,000 mg by mouth every 6 (six) hours as needed for headache.     ALPRAZolam (XANAX) 1 MG tablet Take 0.5 mg by mouth at bedtime.      BIOTIN PO Take 1 capsule by mouth daily.     Calcium Carbonate-Vitamin D (CALCIUM-D PO) Take 2 tablets by mouth 2 (two) times daily.     denosumab (PROLIA) 60 MG/ML SOSY injection Inject 60 mg into the skin every 6 (six) months.     diclofenac Sodium (VOLTAREN) 1 % GEL Apply 1 application topically 3 (three) times daily as needed (pain).     famotidine (PEPCID) 40 MG tablet Take 40 mg by mouth daily before supper. Alternates every 14 days with Protonix     Ginger, Zingiber officinalis, (GINGER PO) Take 1 capsule by mouth daily.     hydrocortisone cream 1 % Apply 1 application topically daily as needed for itching (wound care).     Hyoscyamine Sulfate SL (LEVSIN/SL) 0.125 MG SUBL Place 0.125 mg under the tongue as needed. 30 tablet 1   Misc Natural Products (APPLE CIDER VINEGAR DIET PO) Take by mouth 2 (two) times daily. 1 tablespoon daily , she  adds ginger     Multiple Vitamin (MULTIVITAMIN WITH MINERALS) TABS Take 1 tablet by mouth daily.     NON FORMULARY Take 1-2 capsules by mouth with breakfast, with lunch, and with evening meal. Digest  Basic enzyme  1-2 w/meals      ondansetron (ZOFRAN) 4 MG tablet Take 1 tablet (4 mg total) by mouth every 8 (eight) hours as needed for nausea or vomiting. 30 tablet 1   pantoprazole (PROTONIX) 40 MG tablet Take 40 mg by mouth daily. Alternates every 14 days with Famotidine      Polyethyl Glycol-Propyl Glycol (SYSTANE OP) Place 2 drops into both eyes 2 (two) times  daily as needed (dry eyes).      polyethylene glycol (MIRALAX / GLYCOLAX) packet Take 17 g by mouth daily. 14 each 0   Probiotic Product (ALIGN PO) Take 1 tablet by mouth daily.     vitamin C (ASCORBIC ACID) 500 MG tablet Take 500 mg by mouth daily.     VITAMIN D-VITAMIN K PO Take 1 tablet by mouth 2 (two) times a week. Takes one twice a week (Tues and Sat)     No current facility-administered medications for this visit.    Allergies as of 11/28/2021 - Review Complete 05/19/2020  Allergen Reaction Noted   Codeine Nausea And Vomiting 08/03/2011   Penicillins Rash 08/03/2011   Mobic [meloxicam] Other (See Comments) 12/24/2018   Tramadol Nausea Only 04/11/2018    Family History  Problem Relation Age of Onset   Stroke Mother    Dementia Father    Heart disease Father    Breast cancer Neg Hx     Social History   Socioeconomic History   Marital status: Widowed    Spouse name: Not on file   Number of children: 2   Years of education: Not on file   Highest education level: Not on file  Occupational History   Not on file  Tobacco Use   Smoking status: Never   Smokeless tobacco: Never  Vaping Use   Vaping Use: Never used  Substance and Sexual Activity   Alcohol use: Yes    Comment: small amount of wine    Drug use: No   Sexual activity: Not on file  Other Topics Concern   Not on file  Social History Narrative   The  patient is widowed   Is a never smoker and does drink occasional alcohol   Social Determinants of Corporate investment banker Strain: Not on file  Food Insecurity: Not on file  Transportation Needs: Not on file  Physical Activity: Not on file  Stress: Not on file  Social Connections: Not on file  Intimate Partner Violence: Not on file    Review of Systems:    Constitutional: No weight loss, fever or chills Cardiovascular: No chest pain  Respiratory: No SOB  Gastrointestinal: See HPI and otherwise negative   Physical Exam:  Vital signs: BP 116/60 (BP Location: Left Arm, Patient Position: Sitting, Cuff Size: Normal)   Pulse 84   Ht 4' 7.12" (1.4 m) Comment: height measured without shoes  Wt 102 lb 8 oz (46.5 kg)   BMI 23.72 kg/m    Constitutional:   Pleasant Elderly Caucasian female appears to be in NAD, Well developed, Well nourished, alert and cooperative Respiratory: Respirations even and unlabored. Lungs clear to auscultation bilaterally.   No wheezes, crackles, or rhonchi.  Cardiovascular: Normal S1, S2. No MRG. Regular rate and rhythm. No peripheral edema, cyanosis or pallor.  Gastrointestinal:  Soft, nondistended, nontender. No rebound or guarding. Normal bowel sounds. No appreciable masses or hepatomegaly. Rectal:  Not performed.  Psychiatric:  Demonstrates good judgement and reason without abnormal affect or behaviors.  See HPI for recent work up.  Assessment: 1.  IBS: Patient manages this with Hyoscyamine and MiraLAX 2.  Dysphagia: Acute on chronic symptoms, likely known paraesophageal hernia but will also consider dysmotility +/- esophageal stricture versus other  Plan: 1.  After long discussion with the patient it seems that she has all of the tools in her toolbox to help with flares that she has of irritable bowel.  Explained that the best thing  to note is that she can use the Hyoscyamine at an increased dosage for small amount of time to help with symptoms as  this seems to be very helpful to her recently.  Also discussed that if she were to do all of the measures she just did for her flare and symptoms were worsening then she definitely would need imaging of some sort possibly an x-ray or other to rule out small bowel obstruction as this is also in her history.  Explained that she can certainly see her PCP if she wishes but should also call and let us know as we may have appointments that we could fit her into if she were to experience this again. 2.  In discussion of her dysphagia this seems to be acute on chronic.  We will order a barium esophagram with tablet for further evaluation.  Likely it is her known paraesophageal hiatal hernia, but will make sure that nothing else is complicating the situation. 3.  For now continue Pepcid 40 mg twice a day and Omeprazole 20 mg as needed for increase in symptoms. 4.  Patient to follow in clinic per recommendations after above.  Hyacinth Meeker, PA-C [facility] 11/28/2021, 1:22 PM  Cc: Jennifer Laughter, Jennifer Kirk

## 2021-12-12 ENCOUNTER — Ambulatory Visit (HOSPITAL_COMMUNITY)
Admission: RE | Admit: 2021-12-12 | Discharge: 2021-12-12 | Disposition: A | Discharge status: Home / Self Care | Payer: Medicare Other | Source: Ambulatory Visit | Attending: Physician Assistant | Admitting: Physician Assistant

## 2021-12-12 DIAGNOSIS — R1310 Dysphagia, unspecified: Secondary | ICD-10-CM | POA: Insufficient documentation

## 2021-12-12 DIAGNOSIS — K449 Diaphragmatic hernia without obstruction or gangrene: Secondary | ICD-10-CM | POA: Diagnosis not present

## 2021-12-12 DIAGNOSIS — K224 Dyskinesia of esophagus: Secondary | ICD-10-CM | POA: Diagnosis not present

## 2021-12-14 ENCOUNTER — Telehealth: Payer: Self-pay | Admitting: Internal Medicine

## 2021-12-14 NOTE — Telephone Encounter (Signed)
Pt was made aware of Dr. Carlean Purl recommendations: Pt scheduled for an office visit with Dr. Carlean Purl on 01/26/2022 at 3:30 PM. Pt made aware Pt verbalized understanding with all questions answered.

## 2021-12-14 NOTE — Telephone Encounter (Signed)
I called results of barium swallow to the patient.  She has a severe hiatal hernia with paraesophageal component as well as portions of small intestine and tail of pancreas in the chest.  Symptoms she was having have resolved.  She is not inclined to pursue surgery and I do not think surgeons would be willing to operate, either.  She wants to discuss further in the presence of one of her sons.  Please arrange a follow-up appointment with me for August or September and then she can arrange for her son or sons to come with her.

## 2021-12-16 DIAGNOSIS — M4124 Other idiopathic scoliosis, thoracic region: Secondary | ICD-10-CM | POA: Diagnosis not present

## 2021-12-16 DIAGNOSIS — M4004 Postural kyphosis, thoracic region: Secondary | ICD-10-CM | POA: Diagnosis not present

## 2021-12-16 DIAGNOSIS — M546 Pain in thoracic spine: Secondary | ICD-10-CM | POA: Diagnosis not present

## 2021-12-30 DIAGNOSIS — M4124 Other idiopathic scoliosis, thoracic region: Secondary | ICD-10-CM | POA: Diagnosis not present

## 2021-12-30 DIAGNOSIS — M4004 Postural kyphosis, thoracic region: Secondary | ICD-10-CM | POA: Diagnosis not present

## 2021-12-30 DIAGNOSIS — M546 Pain in thoracic spine: Secondary | ICD-10-CM | POA: Diagnosis not present

## 2022-01-18 DIAGNOSIS — M4124 Other idiopathic scoliosis, thoracic region: Secondary | ICD-10-CM | POA: Diagnosis not present

## 2022-01-18 DIAGNOSIS — M4004 Postural kyphosis, thoracic region: Secondary | ICD-10-CM | POA: Diagnosis not present

## 2022-01-18 DIAGNOSIS — M546 Pain in thoracic spine: Secondary | ICD-10-CM | POA: Diagnosis not present

## 2022-01-26 ENCOUNTER — Ambulatory Visit (INDEPENDENT_AMBULATORY_CARE_PROVIDER_SITE_OTHER): Payer: Medicare Other | Admitting: Internal Medicine

## 2022-01-26 ENCOUNTER — Encounter: Payer: Self-pay | Admitting: Internal Medicine

## 2022-01-26 VITALS — BP 130/74 | HR 89 | Ht <= 58 in | Wt 104.0 lb

## 2022-01-26 DIAGNOSIS — R1310 Dysphagia, unspecified: Secondary | ICD-10-CM | POA: Diagnosis not present

## 2022-01-26 DIAGNOSIS — K224 Dyskinesia of esophagus: Secondary | ICD-10-CM

## 2022-01-26 DIAGNOSIS — K449 Diaphragmatic hernia without obstruction or gangrene: Secondary | ICD-10-CM

## 2022-01-26 NOTE — Progress Notes (Signed)
Jennifer Kirk 86 y.o. 30-Oct-1935 [id number]  Assessment & Plan:   Encounter Diagnoses  Name Primary?   Large hiatal hernia Yes   Dysphagia, unspecified type    Esophageal dysmotility    This is a complicated situation in an 86 year old woman with severe scoliosis.  Surgery would be the only way to fix it and that may not be possible given age and body habitus.  We talked about this together today and we will make a referral to [facility] cardiothoracic surgery to see if they have something to offer.  She was advised if she had prolonged severe pain to go to the hospital emergency department and even call an ambulance if need be.   She might benefit from an EGD though I am not convinced, I think dysmotility is the main issue though dilation of the cricopharyngeal area in the distal esophagus are sites for potential therapy with dilation I am holding off until she gets her surgical evaluation.  That could be done at [facility] as well if necessary.  I think that if she is a surgical candidate is going to be in her best interest to have studies done there with the care team involved.  We will have her get her images to take there. Referral to Dr. Owens Shark    Subjective:   Chief Complaint: Hiatal hernia  HPI 86 year old white woman with a large hiatal hernia and a history of what we thought were small bowel obstructions over the years, who presents for review of recent imaging with both of her sons.  She had seen Ellouise Newer, PA-C with dysphagia complaints within the past few months and a barium swallow was arranged.  It showed the following and I did review the images with the patient and her sons today:    IMPRESSION: 1. Large hiatal hernia, as was demonstrated on the recent prior CT abdomen/pelvis of 11/15/2021. In addition to the hiatal hernia containing the majority of the stomach, it also contains small bowel loops and (appreciated on the prior CT) a portion of the  pancreatic tail. 2. A 13 mm barium tablet was significantly delayed at the level of the distal esophagus/GE junction (by 4-5 minutes). However, the tablet ultimately passed into the stomach with the patient taking additional contrast and warm water. This delay may be due to tortuosity of the distal esophagus. However, a mild smooth distal esophageal stricture cannot be excluded. Consider endoscopy for further evaluation. 3. Moderate intermittent esophageal dysmotility with tertiary contractions. 4. Prominent cricopharyngeus muscle impression upon the posterior aspect of the lower hypopharynx/upper cervical esophagus.  She did have an EGD in 2019 that showed a paraesophageal hiatal hernia no stricture.  She has had chronic intermittent mild dysphagia over the years.  She is also had these episodes where we thought she had small bowel obstructions though it was never totally clear that that was the case on imaging there were suggestions of "early small bowel obstruction".   CT scan on 11/15/2021 showed a "stable large gastric hernia".  She also has a stable hepatic cyst.  She has severe scoliosis which distorts things.  She is going through physical therapy which she finds helpful with pain and mobility.  She asks if that would be a problem and I do not think so I think she should continue.  She does modify her diet with respect to the dysphagia and generally manages.  When she has these obstructive-like symptoms she has managed at home by changing to liquid  diet etc. and waiting it out much of the time.  She has had episodes of a pressure in her chest as well.  Never prolonged that I am aware of.  Maybe up to an hour at the most. Allergies  Allergen Reactions   Codeine Nausea And Vomiting   Penicillins Rash    Has patient had a PCN reaction causing immediate rash, facial/tongue/throat swelling, SOB or lightheadedness with hypotension: Yes Has patient had a PCN reaction causing severe rash  involving mucus membranes or skin necrosis: No Has patient had a PCN reaction that required hospitalization: No Has patient had a PCN reaction occurring within the last 10 years: No If all of the above answers are "NO", then may proceed with Cephalosporin use.    Mobic [Meloxicam] Other (See Comments)    Loss of blood per patient   Tramadol Nausea Only    Intolerance   Current Meds  Medication Sig   acetaminophen (TYLENOL 8 HOUR) 650 MG CR tablet Take 1-2 tablets by mouth as needed.   ALPRAZolam (XANAX) 0.5 MG tablet Take 0.5 mg by mouth at bedtime.   BIOTIN PO Take 1 capsule by mouth daily.   Calcium Carbonate-Vitamin D (CALCIUM-D PO) Take 2 tablets by mouth 2 (two) times daily.   denosumab (PROLIA) 60 MG/ML SOSY injection Inject 60 mg into the skin every 6 (six) months.   diclofenac Sodium (VOLTAREN) 1 % GEL Apply 1 application topically 3 (three) times daily as needed (pain).   famotidine (PEPCID) 40 MG tablet Take 40 mg by mouth daily before supper. Alternates every 14 days with Protonix   Ferrous Sulfate 27 MG TABS Take 1 tablet by mouth daily. Mega Food, Blood Builder   Ginger, Zingiber officinalis, (GINGER PO) Take 1 capsule by mouth daily.   hydrocortisone cream 1 % Apply 1 application topically daily as needed for itching (wound care).   Hyoscyamine Sulfate SL (LEVSIN/SL) 0.125 MG SUBL Place 0.125 mg under the tongue as needed.   Misc Natural Products (APPLE CIDER VINEGAR DIET PO) Take by mouth 2 (two) times daily. 1 tablespoon daily , she adds ginger   Multiple Vitamin (MULTIVITAMIN WITH MINERALS) TABS Take 1 tablet by mouth daily.   NON FORMULARY Take 1-2 capsules by mouth with breakfast, with lunch, and with evening meal. Digest  Basic enzyme  1-2 w/meals    omeprazole (PRILOSEC) 20 MG capsule Take 20 mg by mouth every morning.   ondansetron (ZOFRAN) 4 MG tablet Take 1 tablet (4 mg total) by mouth every 8 (eight) hours as needed for nausea or vomiting.   Peppermint Oil  (IBGARD) 90 MG CPCR Take 2 Capfuls by mouth 3 (three) times daily.   Polyethyl Glycol-Propyl Glycol (SYSTANE OP) Place 2 drops into both eyes 2 (two) times daily as needed (dry eyes).    polyethylene glycol (MIRALAX / GLYCOLAX) packet Take 17 g by mouth daily.   Probiotic Product (ALIGN PO) Take 1 tablet by mouth daily.   vitamin C (ASCORBIC ACID) 500 MG tablet Take 500 mg by mouth daily.   VITAMIN D-VITAMIN K PO Take 1 tablet by mouth 2 (two) times a week. Takes one twice a week (Tues and Sat)   Past Medical History:  Diagnosis Date   Anxiety    Degenerative disc disease    GERD (gastroesophageal reflux disease)    Hiatal hernia 2010   GERD,esophageal dysmotility and paraesophageal hernia on esophogram   Osteopetrosis    degenerative spine disease, lumbar   Scoliosis  Past Surgical History:  Procedure Laterality Date   BIOPSY  02/22/2018   Procedure: BIOPSY;  Surgeon: Thornton Park, MD;  Location: [place];  Service: Gastroenterology;;   BREAST EXCISIONAL BIOPSY Bilateral 08/2002   path: fibrocystic changes.  sclerosing adenosis with microcalcification.   BREAST LUMPECTOMY     COLONOSCOPY  10/2007   screening study.  Dr Owens Loffler.  small internal hemorrhoids, no polyps, no diverticulosis.     COLONOSCOPY WITH PROPOFOL N/A 02/23/2018   Procedure: COLONOSCOPY WITH PROPOFOL;  Surgeon: [name], MD;  Location[facility] END[facility]COPY;  Service: Endoscopy;  Laterality: N/A;   ESOPHAGOGASTRODUODENOSCOPY N/A 02/22/2018   Procedure: ESOPHAGOGASTRODUODENOSCOPY (EGD);  Surgeon: Thornton Park, MD;  Location: [place];  Service: Gastroenterology;  Laterality: N/A;   Social History   Social History Narrative   The patient is widowed   Is a never smoker and does drink occasional alcohol   family history includes Dementia in her father; Heart disease in her father; Stroke in her mother.   Review of Systems  See above Objective:   Physical Exam BP 130/74 (BP Location:  Left Arm, Patient Position: Sitting, Cuff Size: Normal)   Pulse 89   Ht '4\' 10"'$  (1.473 m)   Wt 104 lb (47.2 kg)   SpO2 97%   BMI 21.74 kg/m  Pleasant elderly white woman, with kyphoscoliosis, quite severe.   Greater than 30 minutes time

## 2022-01-26 NOTE — Patient Instructions (Signed)
We will place a referral to [facility] for your hiatal hernia. They will contact you with an appointment.   Cut your food into small pieces and chew your food well.   If you have prolonged chest pain go to the ED.   I appreciate the opportunity to care for you. [name], MD, [facility]

## 2022-01-30 ENCOUNTER — Telehealth: Payer: Self-pay

## 2022-01-30 NOTE — Telephone Encounter (Signed)
-----   Message from [name], MD sent at 01/27/2022  5:35 PM EDT ----- Regarding: referral to [facility] She needs referral to Dr. Owens Shark at [facility] re: large hiatal hernia She will need to get image disc for barium swallow and most recent abd/pelvic Ct and/or chest CT

## 2022-01-30 NOTE — Telephone Encounter (Signed)
I have called [facility] to set up appointment for Ginger re: large hiatal hernia. They said to fax all the info to : fax # [phone]. Referral to Dr Owens Shark. Info faxed.

## 2022-01-30 NOTE — Telephone Encounter (Signed)
I have left son [place] Kirk a detailed message that they need to pick up the actual disc for the Barium Swallow done at [place]- the disc is ready for pick up in admitting. And also to go to [place] at [facility]. [address] to pick up the last CT scan of her abdomin/pelvis that was done. I tried to leave son [name] a message as well and it rang busy, busy. They will take these to [facility] to the appointment.

## 2022-02-01 ENCOUNTER — Telehealth: Payer: Self-pay | Admitting: Internal Medicine

## 2022-02-01 DIAGNOSIS — M546 Pain in thoracic spine: Secondary | ICD-10-CM | POA: Diagnosis not present

## 2022-02-01 DIAGNOSIS — M4004 Postural kyphosis, thoracic region: Secondary | ICD-10-CM | POA: Diagnosis not present

## 2022-02-01 DIAGNOSIS — M4124 Other idiopathic scoliosis, thoracic region: Secondary | ICD-10-CM | POA: Diagnosis not present

## 2022-02-01 NOTE — Telephone Encounter (Signed)
Inbound call from patient returning call please give a call back to further advise.  Thank you

## 2022-02-01 NOTE — Telephone Encounter (Signed)
I called her back and she is aware of where to go to pick up her disc for her future [facility] appointment.

## 2022-02-02 NOTE — Telephone Encounter (Signed)
I spoke with Ginger yesterday and she will go pick up the disc needed for her [place] appointment as her son's are in [place] area.

## 2022-02-08 NOTE — Telephone Encounter (Signed)
Patient called to let yo know she has been scheduled at [facility] 03/07/2022. She is also requesting that we call her home number and not her son.

## 2022-02-08 NOTE — Telephone Encounter (Signed)
I spoke with Ginger and she said thank you for helping her get the [place] appointment.

## 2022-02-09 NOTE — Telephone Encounter (Signed)
Jennifer Kirk has a [facility] appointment on 03/07/2022.

## 2022-02-17 DIAGNOSIS — M546 Pain in thoracic spine: Secondary | ICD-10-CM | POA: Diagnosis not present

## 2022-02-17 DIAGNOSIS — M4124 Other idiopathic scoliosis, thoracic region: Secondary | ICD-10-CM | POA: Diagnosis not present

## 2022-02-17 DIAGNOSIS — M4004 Postural kyphosis, thoracic region: Secondary | ICD-10-CM | POA: Diagnosis not present

## 2022-03-03 DIAGNOSIS — M546 Pain in thoracic spine: Secondary | ICD-10-CM | POA: Diagnosis not present

## 2022-03-03 DIAGNOSIS — M4124 Other idiopathic scoliosis, thoracic region: Secondary | ICD-10-CM | POA: Diagnosis not present

## 2022-03-03 DIAGNOSIS — M4004 Postural kyphosis, thoracic region: Secondary | ICD-10-CM | POA: Diagnosis not present

## 2022-03-07 DIAGNOSIS — K449 Diaphragmatic hernia without obstruction or gangrene: Secondary | ICD-10-CM | POA: Diagnosis not present

## 2022-03-07 DIAGNOSIS — K5900 Constipation, unspecified: Secondary | ICD-10-CM | POA: Diagnosis not present

## 2022-03-07 DIAGNOSIS — Z23 Encounter for immunization: Secondary | ICD-10-CM | POA: Diagnosis not present

## 2022-03-15 DIAGNOSIS — L989 Disorder of the skin and subcutaneous tissue, unspecified: Secondary | ICD-10-CM | POA: Diagnosis not present

## 2022-03-15 DIAGNOSIS — L0292 Furuncle, unspecified: Secondary | ICD-10-CM | POA: Diagnosis not present

## 2022-03-17 DIAGNOSIS — M546 Pain in thoracic spine: Secondary | ICD-10-CM | POA: Diagnosis not present

## 2022-03-17 DIAGNOSIS — M4124 Other idiopathic scoliosis, thoracic region: Secondary | ICD-10-CM | POA: Diagnosis not present

## 2022-03-17 DIAGNOSIS — M4004 Postural kyphosis, thoracic region: Secondary | ICD-10-CM | POA: Diagnosis not present

## 2022-03-21 DIAGNOSIS — L989 Disorder of the skin and subcutaneous tissue, unspecified: Secondary | ICD-10-CM | POA: Diagnosis not present

## 2022-03-29 DIAGNOSIS — Z23 Encounter for immunization: Secondary | ICD-10-CM | POA: Diagnosis not present

## 2022-03-29 DIAGNOSIS — L989 Disorder of the skin and subcutaneous tissue, unspecified: Secondary | ICD-10-CM | POA: Diagnosis not present

## 2022-04-05 DIAGNOSIS — L578 Other skin changes due to chronic exposure to nonionizing radiation: Secondary | ICD-10-CM | POA: Diagnosis not present

## 2022-04-05 DIAGNOSIS — D485 Neoplasm of uncertain behavior of skin: Secondary | ICD-10-CM | POA: Diagnosis not present

## 2022-04-05 DIAGNOSIS — L814 Other melanin hyperpigmentation: Secondary | ICD-10-CM | POA: Diagnosis not present

## 2022-04-06 DIAGNOSIS — M546 Pain in thoracic spine: Secondary | ICD-10-CM | POA: Diagnosis not present

## 2022-04-06 DIAGNOSIS — M4124 Other idiopathic scoliosis, thoracic region: Secondary | ICD-10-CM | POA: Diagnosis not present

## 2022-04-06 DIAGNOSIS — M4004 Postural kyphosis, thoracic region: Secondary | ICD-10-CM | POA: Diagnosis not present

## 2022-04-24 DIAGNOSIS — M4004 Postural kyphosis, thoracic region: Secondary | ICD-10-CM | POA: Diagnosis not present

## 2022-04-24 DIAGNOSIS — M4124 Other idiopathic scoliosis, thoracic region: Secondary | ICD-10-CM | POA: Diagnosis not present

## 2022-04-24 DIAGNOSIS — M546 Pain in thoracic spine: Secondary | ICD-10-CM | POA: Diagnosis not present

## 2022-04-25 ENCOUNTER — Other Ambulatory Visit: Payer: Self-pay | Admitting: Gastroenterology

## 2022-05-02 DIAGNOSIS — C44329 Squamous cell carcinoma of skin of other parts of face: Secondary | ICD-10-CM | POA: Diagnosis not present

## 2022-05-02 HISTORY — PX: MOHS SURGERY: SHX181

## 2022-05-04 DIAGNOSIS — D500 Iron deficiency anemia secondary to blood loss (chronic): Secondary | ICD-10-CM | POA: Diagnosis not present

## 2022-05-04 DIAGNOSIS — K909 Intestinal malabsorption, unspecified: Secondary | ICD-10-CM | POA: Diagnosis not present

## 2022-05-04 DIAGNOSIS — K219 Gastro-esophageal reflux disease without esophagitis: Secondary | ICD-10-CM | POA: Diagnosis not present

## 2022-05-04 DIAGNOSIS — C4492 Squamous cell carcinoma of skin, unspecified: Secondary | ICD-10-CM | POA: Diagnosis not present

## 2022-05-04 DIAGNOSIS — M4125 Other idiopathic scoliosis, thoracolumbar region: Secondary | ICD-10-CM | POA: Diagnosis not present

## 2022-05-04 DIAGNOSIS — Z79899 Other long term (current) drug therapy: Secondary | ICD-10-CM | POA: Diagnosis not present

## 2022-05-04 DIAGNOSIS — Z1331 Encounter for screening for depression: Secondary | ICD-10-CM | POA: Diagnosis not present

## 2022-05-04 DIAGNOSIS — Z5181 Encounter for therapeutic drug level monitoring: Secondary | ICD-10-CM | POA: Diagnosis not present

## 2022-05-04 DIAGNOSIS — Z0000 Encounter for general adult medical examination without abnormal findings: Secondary | ICD-10-CM | POA: Diagnosis not present

## 2022-05-04 DIAGNOSIS — E7800 Pure hypercholesterolemia, unspecified: Secondary | ICD-10-CM | POA: Diagnosis not present

## 2022-05-04 DIAGNOSIS — I700 Atherosclerosis of aorta: Secondary | ICD-10-CM | POA: Diagnosis not present

## 2022-05-09 DIAGNOSIS — M810 Age-related osteoporosis without current pathological fracture: Secondary | ICD-10-CM | POA: Diagnosis not present

## 2022-05-16 ENCOUNTER — Ambulatory Visit (INDEPENDENT_AMBULATORY_CARE_PROVIDER_SITE_OTHER): Payer: Medicare Other | Admitting: Internal Medicine

## 2022-05-16 ENCOUNTER — Encounter: Payer: Self-pay | Admitting: Internal Medicine

## 2022-05-16 VITALS — BP 110/74 | HR 83 | Ht <= 58 in | Wt 106.0 lb

## 2022-05-16 DIAGNOSIS — Z8719 Personal history of other diseases of the digestive system: Secondary | ICD-10-CM | POA: Diagnosis not present

## 2022-05-16 DIAGNOSIS — K449 Diaphragmatic hernia without obstruction or gangrene: Secondary | ICD-10-CM | POA: Diagnosis not present

## 2022-05-16 NOTE — Patient Instructions (Signed)
Good to talk today.  Continue with your current treatment.  Good luck when and if you have the hernia surgery.  We are here for you as needed going forward.  I appreciate the opportunity to care for you. [name], MD, [facility]

## 2022-05-16 NOTE — Progress Notes (Signed)
Jennifer Kirk 86 y.o. 1936-01-14 [id number]  Assessment & Plan:   Encounter Diagnoses  Name Primary?   Large hiatal hernia Yes   History of small bowel obstruction    I think reasonable o purse surgery as offered by Dr. Maryjane Hurter - has hiatal hernia - larghe with imaging at times showing pancreas and small bowel also herniating into chest.  She plans to schedule f/u Dr. Maryjane Hurter at [place] for [facility] but she is trying to sort out estate planning and possibe move to [place]  F/u me prn at this time  Subjective:   Chief Complaint: hiatal hernia ? About surgery  HPI 86 yo ww with a large hiatal hernia and herniated pancreas and small bowel seen on past Ct scans, who has recurrent signs/sxs of SBO that she typically manages on her own with liquid diet, ondansetron and hyoscyamine. She had a good visit with Dr. Lewayne Bunting earlier this year and his note indicates that she is an appropriate candidate for hiatal hernia repair. She is likely going to do it but trying yo get estate planning and potential retirement home move sorted out. Has not had any major problems latealy except for a mild incident where she ate a sandwich too quickly before driving to her second home in [place] Allergies  Allergen Reactions   Codeine Nausea And Vomiting   Penicillins Rash    Has patient had a PCN reaction causing immediate rash, facial/tongue/throat swelling, SOB or lightheadedness with hypotension: Yes Has patient had a PCN reaction causing severe rash involving mucus membranes or skin necrosis: No Has patient had a PCN reaction that required hospitalization: No Has patient had a PCN reaction occurring within the last 10 years: No If all of the above answers are "NO", then may proceed with Cephalosporin use.    Mobic [Meloxicam] Other (See Comments)    Loss of blood per patient   Tramadol Nausea Only    Intolerance   Current Meds  Medication Sig   acetaminophen (TYLENOL 8 HOUR) 650  MG CR tablet Take 1-2 tablets by mouth as needed.   ALPRAZolam (XANAX) 0.5 MG tablet Take 0.5 mg by mouth at bedtime.   BIOTIN PO Take 1 capsule by mouth daily.   Calcium Carbonate-Vitamin D (CALCIUM-D PO) Take 2 tablets by mouth 2 (two) times daily.   denosumab (PROLIA) 60 MG/ML SOSY injection Inject 60 mg into the skin every 6 (six) months.   diclofenac Sodium (VOLTAREN) 1 % GEL Apply 1 application topically 3 (three) times daily as needed (pain).   famotidine (PEPCID) 40 MG tablet Take 80 mg by mouth daily before supper. Alternates every 14 days with Protonix   Ferrous Sulfate 27 MG TABS Take 1 tablet by mouth daily. Mega Food, Blood Builder   Ginger, Zingiber officinalis, (GINGER PO) Take 1 capsule by mouth daily.   hydrocortisone cream 1 % Apply 1 application topically daily as needed for itching (wound care).   hyoscyamine (LEVSIN SL) 0.125 MG SL tablet PLACE 0.125 MG UNDER THE TONGUE AS NEEDED.   Misc Natural Products (APPLE CIDER VINEGAR DIET PO) Take by mouth 2 (two) times daily. 1 tablespoon daily , she adds ginger   Multiple Vitamin (MULTIVITAMIN WITH MINERALS) TABS Take 1 tablet by mouth daily.   NON FORMULARY Take 1-2 capsules by mouth with breakfast, with lunch, and with evening meal. Digest  Basic enzyme  1-2 w/meals    omeprazole (PRILOSEC) 20 MG capsule Take 20 mg by mouth every  morning.   ondansetron (ZOFRAN) 4 MG tablet Take 1 tablet (4 mg total) by mouth every 8 (eight) hours as needed for nausea or vomiting.   Peppermint Oil (IBGARD) 90 MG CPCR Take 2 Capfuls by mouth 3 (three) times daily.   Polyethyl Glycol-Propyl Glycol (SYSTANE OP) Place 2 drops into both eyes 2 (two) times daily as needed (dry eyes).    polyethylene glycol (MIRALAX / GLYCOLAX) packet Take 17 g by mouth daily.   Probiotic Product (ALIGN PO) Take 1 tablet by mouth daily. Pt taking a 1/2 cup of kefir   vitamin C (ASCORBIC ACID) 500 MG tablet Take 500 mg by mouth daily.   VITAMIN D-VITAMIN K PO Take 1  tablet by mouth 2 (two) times a week. Takes one twice a week (Tues and Sat)   Past Medical History:  Diagnosis Date   Anxiety    Degenerative disc disease    GERD (gastroesophageal reflux disease)    Hiatal hernia 2010   GERD,esophageal dysmotility and paraesophageal hernia on esophogram   Osteopetrosis    degenerative spine disease, lumbar   Scoliosis    Past Surgical History:  Procedure Laterality Date   BIOPSY  02/22/2018   Procedure: BIOPSY;  Surgeon: Thornton Park, MD;  Location: [place];  Service: Gastroenterology;;   BREAST EXCISIONAL BIOPSY Bilateral 08/2002   path: fibrocystic changes.  sclerosing adenosis with microcalcification.   BREAST LUMPECTOMY     COLONOSCOPY  10/2007   screening study.  Dr Owens Loffler.  small internal hemorrhoids, no polyps, no diverticulosis.     COLONOSCOPY WITH PROPOFOL N/A 02/23/2018   Procedure: COLONOSCOPY WITH PROPOFOL;  Surgeon: [name], MD;  Location: [facility] ENDOSCOPY;  Service: Endoscopy;  Laterality: N/A;   ESOPHAGOGASTRODUODENOSCOPY N/A 02/22/2018   Procedure: ESOPHAGOGASTRODUODENOSCOPY (EGD);  Surgeon: Thornton Park, MD;  Location: [place];  Service: Gastroenterology;  Laterality: N/A;   Social History   Social History Narrative   The patient is widowed   Is a never smoker and does drink occasional alcohol   family history includes Dementia in her father; Heart disease in her father; Stroke in her mother.   Review of Systems Moh's surgery on forehead - skin ca  Objective:   Physical Exam BP 110/74   Pulse 83   Ht '4\' 10"'$  (1.473 m)   Wt 106 lb (48.1 kg)   BMI 22.15 kg/m  [place] kyphotoc ww NAD  26 mins total time

## 2022-05-19 DIAGNOSIS — M4004 Postural kyphosis, thoracic region: Secondary | ICD-10-CM | POA: Diagnosis not present

## 2022-05-19 DIAGNOSIS — M546 Pain in thoracic spine: Secondary | ICD-10-CM | POA: Diagnosis not present

## 2022-05-19 DIAGNOSIS — M4124 Other idiopathic scoliosis, thoracic region: Secondary | ICD-10-CM | POA: Diagnosis not present

## 2022-06-14 DIAGNOSIS — M4004 Postural kyphosis, thoracic region: Secondary | ICD-10-CM | POA: Diagnosis not present

## 2022-06-14 DIAGNOSIS — M546 Pain in thoracic spine: Secondary | ICD-10-CM | POA: Diagnosis not present

## 2022-06-14 DIAGNOSIS — M4124 Other idiopathic scoliosis, thoracic region: Secondary | ICD-10-CM | POA: Diagnosis not present

## 2022-06-28 DIAGNOSIS — M4124 Other idiopathic scoliosis, thoracic region: Secondary | ICD-10-CM | POA: Diagnosis not present

## 2022-06-28 DIAGNOSIS — M546 Pain in thoracic spine: Secondary | ICD-10-CM | POA: Diagnosis not present

## 2022-06-28 DIAGNOSIS — M4004 Postural kyphosis, thoracic region: Secondary | ICD-10-CM | POA: Diagnosis not present

## 2022-07-10 DIAGNOSIS — H90A32 Mixed conductive and sensorineural hearing loss, unilateral, left ear with restricted hearing on the contralateral side: Secondary | ICD-10-CM | POA: Diagnosis not present

## 2022-07-10 DIAGNOSIS — H9212 Otorrhea, left ear: Secondary | ICD-10-CM | POA: Diagnosis not present

## 2022-07-10 DIAGNOSIS — H7292 Unspecified perforation of tympanic membrane, left ear: Secondary | ICD-10-CM | POA: Diagnosis not present

## 2022-07-13 DIAGNOSIS — M4004 Postural kyphosis, thoracic region: Secondary | ICD-10-CM | POA: Diagnosis not present

## 2022-07-13 DIAGNOSIS — M546 Pain in thoracic spine: Secondary | ICD-10-CM | POA: Diagnosis not present

## 2022-07-13 DIAGNOSIS — M4124 Other idiopathic scoliosis, thoracic region: Secondary | ICD-10-CM | POA: Diagnosis not present

## 2022-08-07 DIAGNOSIS — M546 Pain in thoracic spine: Secondary | ICD-10-CM | POA: Diagnosis not present

## 2022-08-07 DIAGNOSIS — M4004 Postural kyphosis, thoracic region: Secondary | ICD-10-CM | POA: Diagnosis not present

## 2022-08-07 DIAGNOSIS — M4124 Other idiopathic scoliosis, thoracic region: Secondary | ICD-10-CM | POA: Diagnosis not present

## 2022-08-15 DIAGNOSIS — L910 Hypertrophic scar: Secondary | ICD-10-CM | POA: Diagnosis not present

## 2022-08-15 DIAGNOSIS — C44329 Squamous cell carcinoma of skin of other parts of face: Secondary | ICD-10-CM | POA: Diagnosis not present

## 2022-08-29 DIAGNOSIS — H903 Sensorineural hearing loss, bilateral: Secondary | ICD-10-CM | POA: Diagnosis not present

## 2022-08-29 DIAGNOSIS — H90A32 Mixed conductive and sensorineural hearing loss, unilateral, left ear with restricted hearing on the contralateral side: Secondary | ICD-10-CM | POA: Diagnosis not present

## 2022-08-29 DIAGNOSIS — N390 Urinary tract infection, site not specified: Secondary | ICD-10-CM | POA: Diagnosis not present

## 2022-09-07 DIAGNOSIS — R350 Frequency of micturition: Secondary | ICD-10-CM | POA: Diagnosis not present

## 2022-09-08 ENCOUNTER — Telehealth: Payer: Self-pay | Admitting: Internal Medicine

## 2022-09-08 NOTE — Telephone Encounter (Signed)
Inbound call from patient requesting to speak with a nurse in regards diarrhea she is going to the bathroom 7/8 times at day.! Please advise

## 2022-09-08 NOTE — Telephone Encounter (Signed)
Pt states she saw her PCP yesterday and she told him she was having 7/8 loose stools/day. He told her to decrease her nightly dose of miralax to 1/2 dose. Pt wants to know if that is ok. She also wants to know if she can increase the fiber in her diet. States right now she is having about 8-9gm fiber/day. She wanted to schedule an appt with Dr. Leone Payor. Pt scheduled to see Dr. Leone Payor 09/19/22 at 3:10pm, she is aware of appt. Please advise regarding miralax and fiber.

## 2022-09-09 NOTE — Telephone Encounter (Signed)
Spoke to patient afternoon of 45 and she will reduce MiraLax and increase fiber in form of soluble fiber More at f/u

## 2022-09-14 DIAGNOSIS — M4004 Postural kyphosis, thoracic region: Secondary | ICD-10-CM | POA: Diagnosis not present

## 2022-09-14 DIAGNOSIS — M546 Pain in thoracic spine: Secondary | ICD-10-CM | POA: Diagnosis not present

## 2022-09-14 DIAGNOSIS — M4124 Other idiopathic scoliosis, thoracic region: Secondary | ICD-10-CM | POA: Diagnosis not present

## 2022-09-18 ENCOUNTER — Telehealth: Payer: Self-pay | Admitting: Internal Medicine

## 2022-09-18 NOTE — Telephone Encounter (Signed)
The pt states that she will keep the appt. She believes any side effects from the medication will have worn off by the time of the appt. She will let us know if she is not safe enough to drive tomorrow.

## 2022-09-18 NOTE — Telephone Encounter (Signed)
Patient called stating that she has a UTI and has started a new medication from PCP and is having some complications from it such as weakness and is feeling shaky and dizzy, concerned because she has no one to drive her to her appointment tomorrow 4/16. She is wondering if she could have this appointment virtual. Requesting a call back to discuss this on home phone. Please advise.

## 2022-09-19 ENCOUNTER — Ambulatory Visit (INDEPENDENT_AMBULATORY_CARE_PROVIDER_SITE_OTHER): Payer: Medicare Other | Admitting: Internal Medicine

## 2022-09-19 ENCOUNTER — Encounter: Payer: Self-pay | Admitting: Internal Medicine

## 2022-09-19 VITALS — BP 110/70 | HR 94 | Ht <= 58 in | Wt 105.4 lb

## 2022-09-19 DIAGNOSIS — Z8719 Personal history of other diseases of the digestive system: Secondary | ICD-10-CM

## 2022-09-19 DIAGNOSIS — K449 Diaphragmatic hernia without obstruction or gangrene: Secondary | ICD-10-CM

## 2022-09-19 DIAGNOSIS — R1310 Dysphagia, unspecified: Secondary | ICD-10-CM

## 2022-09-19 NOTE — Patient Instructions (Addendum)
_______________________________________________________  If your blood pressure at your visit was 140/90 or greater, please contact your primary care physician to follow up on this.  _______________________________________________________  If you are age 85 or older, your body mass index should be between 23-30. Your Body mass index is 22.03 kg/m. If this is out of the aforementioned range listed, please consider follow up with your Primary Care Provider.  If you are age 28 or younger, your body mass index should be between 19-25. Your Body mass index is 22.03 kg/m. If this is out of the aformentioned range listed, please consider follow up with your Primary Care Provider.   ________________________________________________________  The [facility] providers would like to encourage you to use [facility] to communicate with providers for non-urgent requests or questions.  Due to long hold times on the telephone, sending your provider a message by [facility] may be a faster and more efficient way to get a response.  Please allow 48 business hours for a response.  Please remember that this is for non-urgent requests.  _______________________________________________________  Follow up in 6 months. Please call us in late August to get an October appointment.   Continue the half dose of Miralax and titrate dose as needed.  It was a pleasure to see you today!  Thank you for trusting me with your gastrointestinal care!

## 2022-09-19 NOTE — Progress Notes (Unsigned)
Jennifer Kirk 87 y.o. 1936-05-05 [id number]  Assessment & Plan:   Encounter Diagnoses  Name Primary?   Large hiatal hernia Yes   Dysphagia, unspecified type    History of small bowel obstruction    She is managing things.  She is improved with respect to stool frequency and quality of life by reducing MiraLAX in half.  She will continue that and titrate for effect.  She is leaning away from any surgery for her hiatal hernia and I support that.  She is 57 she has this kyphoscoliosis, and I think she is at higher risk of complications though Dr. Rhona Raider at [facility] has indicated he could perform the surgery.  She will follow-up with me at this point.  I would like to see her in about 6 months she knows to call to get an appointment as we do not have a schedule at 6 months right now.  And call back sooner as needed.  Discussed alternative calcium supplements that would be easier to swallow, question she chewable versions, with primary care CC: Emilio Aspen, MD   Subjective:   Chief Complaint: Hiatal hernia, stool frequency issues pill dysphagia  HPI 87 yo ww with a large hiatal hernia and herniated pancreas and small bowel seen on past Ct scans, who has recurrent signs/sxs of SBO that she typically manages on her own with liquid diet, ondansetron and hyoscyamine who contacted me recently with concerns about stool frequency.  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  She saw primary care who suggested she cut the MiraLAX in half and she confirmed with me and I supported that and she is doing much better.  Life is a whole lot better with that.  Less frequency on the stools are more formed and urgency is gone.  She does have some pill dysphagia.  Calcium pills especially.  Some  other pills to but no significant food dysphagia.  She can take some foods with food like applesauce and get relief or avoid difficulties.  I have suggested she talk to her primary care provider about options  Currently on Cipro for UTI which made her dizzy and woozy yesterday but she is better today.  She reports that she is leaning away from having surgical repair of her hiatal hernia.   Wt Readings from Last 3 Encounters:  09/19/22 105 lb 6.4 oz (47.8 kg)  05/16/22 106 lb (48.1 kg)  01/26/22 104 lb (47.2 kg)     Allergies  Allergen Reactions   Codeine Nausea And Vomiting   Penicillins Rash    Has patient had a PCN reaction causing immediate rash, facial/tongue/throat swelling, SOB or lightheadedness with hypotension: Yes Has patient had a PCN reaction causing severe rash involving mucus membranes or skin necrosis: No Has patient had a PCN reaction that required hospitalization: No Has patient had a PCN reaction occurring within the last 10 years: No If all of the above answers are "NO", then may proceed with Cephalosporin use.    Mobic [Meloxicam] Other (See Comments)    Loss of blood per patient   Tramadol Nausea Only    Intolerance   Current Meds  Medication Sig   acetaminophen (TYLENOL 8 HOUR) 650 MG CR tablet Take 1-2 tablets by mouth as  needed.   ALPRAZolam (XANAX) 0.5 MG tablet Take 0.5 mg by mouth at bedtime.   BIOTIN PO Take 1 capsule by mouth daily.   Calcium Carbonate-Vitamin D (CALCIUM-D PO) Take 2 tablets by mouth 2 (two) times daily.   denosumab (PROLIA) 60 MG/ML SOSY injection Inject 60 mg into the skin every 6 (six) months.   diclofenac Sodium (VOLTAREN) 1 % GEL Apply 1 application topically 3 (three) times daily as needed (pain).   famotidine (PEPCID) 40 MG tablet Take 80 mg by mouth daily before supper. Alternates every 14 days with Protonix   Ferrous Sulfate 27 MG TABS Take 1 tablet by mouth daily. Mega Food, Blood Builder   Ginger, Zingiber officinalis,  (GINGER PO) Take 1 capsule by mouth daily.   hydrocortisone cream 1 % Apply 1 application topically daily as needed for itching (wound care).   hyoscyamine (LEVSIN SL) 0.125 MG SL tablet PLACE 0.125 MG UNDER THE TONGUE AS NEEDED.   Misc Natural Products (APPLE CIDER VINEGAR DIET PO) Take by mouth 2 (two) times daily. 1 tablespoon daily , she adds ginger   Multiple Vitamin (MULTIVITAMIN WITH MINERALS) TABS Take 1 tablet by mouth daily.   NON FORMULARY Take 1-2 capsules by mouth with breakfast, with lunch, and with evening meal. Digest  Basic enzyme  1-2 w/meals    ondansetron (ZOFRAN) 4 MG tablet Take 1 tablet (4 mg total) by mouth every 8 (eight) hours as needed for nausea or vomiting.   pantoprazole (PROTONIX) 40 MG tablet Take 40 mg by mouth daily.   Peppermint Oil (IBGARD) 90 MG CPCR Take 2 Capfuls by mouth 3 (three) times daily.   Polyethyl Glycol-Propyl Glycol (SYSTANE OP) Place 2 drops into both eyes 2 (two) times daily as needed (dry eyes).    polyethylene glycol (MIRALAX / GLYCOLAX) packet Take 17 g by mouth daily.   Probiotic Product (ALIGN PO) Take 1 tablet by mouth daily. Pt taking a 1/2 cup of kefir   vitamin C (ASCORBIC ACID) 500 MG tablet Take 500 mg by mouth daily.   Past Medical History:  Diagnosis Date   Anxiety    Degenerative disc disease    GERD (gastroesophageal reflux disease)    Hiatal hernia 2010   GERD,esophageal dysmotility and paraesophageal hernia on esophogram   Osteopetrosis    degenerative spine disease, lumbar   Scoliosis    Past Surgical History:  Procedure Laterality Date   BIOPSY  02/22/2018   Procedure: BIOPSY;  Surgeon: Tressia Danas, MD;  Location: [facility] ENDOSCOPY;  Service: Gastroenterology;;   BREAST EXCISIONAL BIOPSY Bilateral 08/2002   path: fibrocystic changes.  sclerosing adenosis with microcalcification.   BREAST LUMPECTOMY     COLONOSCOPY  10/2007   screening study.  Dr Rob Bunting.  small internal hemorrhoids, no polyps, no  diverticulosis.     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   Review of Systems As above  Objective:   Physical Exam Elderly white woman diminutive, kyphoscoliosis.  Alert and oriented no acute distress.

## 2022-10-02 DIAGNOSIS — H903 Sensorineural hearing loss, bilateral: Secondary | ICD-10-CM | POA: Diagnosis not present

## 2022-10-13 DIAGNOSIS — M4004 Postural kyphosis, thoracic region: Secondary | ICD-10-CM | POA: Diagnosis not present

## 2022-10-13 DIAGNOSIS — M4124 Other idiopathic scoliosis, thoracic region: Secondary | ICD-10-CM | POA: Diagnosis not present

## 2022-10-13 DIAGNOSIS — M546 Pain in thoracic spine: Secondary | ICD-10-CM | POA: Diagnosis not present

## 2022-10-16 DIAGNOSIS — H7292 Unspecified perforation of tympanic membrane, left ear: Secondary | ICD-10-CM | POA: Diagnosis not present

## 2022-10-16 DIAGNOSIS — H9212 Otorrhea, left ear: Secondary | ICD-10-CM | POA: Diagnosis not present

## 2022-11-01 ENCOUNTER — Ambulatory Visit: Payer: Medicare Other | Admitting: Physician Assistant

## 2022-11-10 DIAGNOSIS — M4124 Other idiopathic scoliosis, thoracic region: Secondary | ICD-10-CM | POA: Diagnosis not present

## 2022-11-10 DIAGNOSIS — M546 Pain in thoracic spine: Secondary | ICD-10-CM | POA: Diagnosis not present

## 2022-11-10 DIAGNOSIS — M4004 Postural kyphosis, thoracic region: Secondary | ICD-10-CM | POA: Diagnosis not present

## 2022-11-21 DIAGNOSIS — M810 Age-related osteoporosis without current pathological fracture: Secondary | ICD-10-CM | POA: Diagnosis not present

## 2022-11-24 DIAGNOSIS — M4124 Other idiopathic scoliosis, thoracic region: Secondary | ICD-10-CM | POA: Diagnosis not present

## 2022-11-24 DIAGNOSIS — M4004 Postural kyphosis, thoracic region: Secondary | ICD-10-CM | POA: Diagnosis not present

## 2022-11-24 DIAGNOSIS — M546 Pain in thoracic spine: Secondary | ICD-10-CM | POA: Diagnosis not present

## 2022-11-24 DIAGNOSIS — M25511 Pain in right shoulder: Secondary | ICD-10-CM | POA: Diagnosis not present

## 2022-12-22 DIAGNOSIS — H52203 Unspecified astigmatism, bilateral: Secondary | ICD-10-CM | POA: Diagnosis not present

## 2022-12-22 DIAGNOSIS — H26493 Other secondary cataract, bilateral: Secondary | ICD-10-CM | POA: Diagnosis not present

## 2022-12-22 DIAGNOSIS — Z961 Presence of intraocular lens: Secondary | ICD-10-CM | POA: Diagnosis not present

## 2022-12-22 DIAGNOSIS — H1789 Other corneal scars and opacities: Secondary | ICD-10-CM | POA: Diagnosis not present

## 2022-12-29 ENCOUNTER — Telehealth: Payer: Self-pay | Admitting: Internal Medicine

## 2022-12-29 NOTE — Telephone Encounter (Signed)
Patient called in to ask if she could take Zofran or Levsin for dry heaving that she experienced earlier this week. On Sunday, she had ate too fast & drank some milk which she felt caused her hiatal hernia to spasm. She had taken Levsin which provided relief. Advised she could take Zofran PRN as prescribed if she dry heaving/nausea returns. She also asked about taking a fiber supplement. Currently taking 1/2 cap of Miralax, and advised she could try OTC fiber supplement such as benefiber. Also discussed foods that she could add to her diet that are high in fiber. Advised she call back if symptoms return or worsen, or if she has any questions. She has OV with Dr. Leone Payor in October.

## 2022-12-29 NOTE — Telephone Encounter (Signed)
Pt said she keeps getting episodes of "gagging"  She said she has some zofran and hyoscyamine at home and wants to know if she can take either medication?

## 2023-01-01 DIAGNOSIS — M4124 Other idiopathic scoliosis, thoracic region: Secondary | ICD-10-CM | POA: Diagnosis not present

## 2023-01-01 DIAGNOSIS — M546 Pain in thoracic spine: Secondary | ICD-10-CM | POA: Diagnosis not present

## 2023-01-01 DIAGNOSIS — M25511 Pain in right shoulder: Secondary | ICD-10-CM | POA: Diagnosis not present

## 2023-01-01 DIAGNOSIS — M4004 Postural kyphosis, thoracic region: Secondary | ICD-10-CM | POA: Diagnosis not present

## 2023-01-03 DIAGNOSIS — H26491 Other secondary cataract, right eye: Secondary | ICD-10-CM | POA: Diagnosis not present

## 2023-01-10 DIAGNOSIS — H26492 Other secondary cataract, left eye: Secondary | ICD-10-CM | POA: Diagnosis not present

## 2023-01-18 DIAGNOSIS — M25511 Pain in right shoulder: Secondary | ICD-10-CM | POA: Diagnosis not present

## 2023-01-18 DIAGNOSIS — M4124 Other idiopathic scoliosis, thoracic region: Secondary | ICD-10-CM | POA: Diagnosis not present

## 2023-01-18 DIAGNOSIS — M4004 Postural kyphosis, thoracic region: Secondary | ICD-10-CM | POA: Diagnosis not present

## 2023-01-18 DIAGNOSIS — M546 Pain in thoracic spine: Secondary | ICD-10-CM | POA: Diagnosis not present

## 2023-02-23 DIAGNOSIS — M25511 Pain in right shoulder: Secondary | ICD-10-CM | POA: Diagnosis not present

## 2023-02-23 DIAGNOSIS — M4124 Other idiopathic scoliosis, thoracic region: Secondary | ICD-10-CM | POA: Diagnosis not present

## 2023-02-23 DIAGNOSIS — M546 Pain in thoracic spine: Secondary | ICD-10-CM | POA: Diagnosis not present

## 2023-02-23 DIAGNOSIS — M4004 Postural kyphosis, thoracic region: Secondary | ICD-10-CM | POA: Diagnosis not present

## 2023-03-26 DIAGNOSIS — M25511 Pain in right shoulder: Secondary | ICD-10-CM | POA: Diagnosis not present

## 2023-03-26 DIAGNOSIS — M546 Pain in thoracic spine: Secondary | ICD-10-CM | POA: Diagnosis not present

## 2023-03-26 DIAGNOSIS — M4004 Postural kyphosis, thoracic region: Secondary | ICD-10-CM | POA: Diagnosis not present

## 2023-03-26 DIAGNOSIS — M4124 Other idiopathic scoliosis, thoracic region: Secondary | ICD-10-CM | POA: Diagnosis not present

## 2023-03-28 ENCOUNTER — Ambulatory Visit (INDEPENDENT_AMBULATORY_CARE_PROVIDER_SITE_OTHER): Payer: Medicare Other | Admitting: Internal Medicine

## 2023-03-28 ENCOUNTER — Encounter: Payer: Self-pay | Admitting: Internal Medicine

## 2023-03-28 VITALS — BP 122/72 | HR 84 | Ht <= 58 in | Wt 103.4 lb

## 2023-03-28 DIAGNOSIS — K224 Dyskinesia of esophagus: Secondary | ICD-10-CM

## 2023-03-28 DIAGNOSIS — K449 Diaphragmatic hernia without obstruction or gangrene: Secondary | ICD-10-CM

## 2023-03-28 DIAGNOSIS — Z8719 Personal history of other diseases of the digestive system: Secondary | ICD-10-CM | POA: Diagnosis not present

## 2023-03-28 DIAGNOSIS — R1319 Other dysphagia: Secondary | ICD-10-CM

## 2023-03-28 MED ORDER — ONDANSETRON HCL 4 MG PO TABS: 4.0000 mg | ORAL_TABLET | ORAL | 1 refills | Status: DC | PRN

## 2023-03-28 MED ORDER — HYOSCYAMINE SULFATE 0.125 MG SL SUBL: 0.1250 mg | SUBLINGUAL_TABLET | SUBLINGUAL | 1 refills | Status: DC | PRN

## 2023-03-28 NOTE — Progress Notes (Signed)
Jennifer Kirk 87 y.o. 1935/08/14 [id number]  Assessment & Plan:   Encounter Diagnoses  Name Primary?   Large hiatal hernia Yes   Esophageal dysmotility    Esophageal dysphagia    History of small bowel obstruction    She has a large hiatal hernia and herniation of small bowel and pancreas related to this.  She is having worsening problems or more frequent problems.  I have recommended she return to Dr. Rhona Raider at [facility] for reassessment and reconsideration for surgical repair.  CC: Emilio Aspen, MD   Subjective:   Chief Complaint: Hiatal hernia follow-up  HPI 87 year old white woman with a large hiatal hernia previously seen by Dr. Rhona Raider at [facility], the patient has been considering possible surgery.  I last saw her in April of this year.  The hernia is complicated and  imaging has demonstrated that small bowel and a portion of the pancreas herniate into the chest as well.  She manages SBO like symptoms using hyoscyamine and ondansetron and a liquid diet.  She is concerned that the symptoms are getting more frequent and she reviews a list of dates of flares.  Periumbilical pain and dysphagia issues are the main symptoms.  She still manages as above but she thinks there has been an increase in frequency and she wonders what to do next.  She is willing to return for surgical consultation.  She also continues to use MiraLAX to promote bowel habit regularity.  Wt Readings from Last 3 Encounters:  03/28/23 103 lb 6 oz (46.9 kg)  09/19/22 105 lb 6.4 oz (47.8 kg)  05/16/22 106 lb (48.1 kg)     Allergies  Allergen Reactions   Codeine Nausea And Vomiting   Penicillins Rash    Has patient had a PCN reaction causing immediate rash, facial/tongue/throat swelling, SOB or lightheadedness with hypotension: Yes Has patient had a PCN reaction causing severe rash involving mucus membranes or skin necrosis: No Has patient had a PCN reaction that required hospitalization: No Has  patient had a PCN reaction occurring within the last 10 years: No If all of the above answers are "NO", then may proceed with Cephalosporin use.    Mobic [Meloxicam] Other (See Comments)    Loss of blood per patient   Tramadol Nausea Only    Intolerance   Current Meds  Medication Sig   acetaminophen (TYLENOL 8 HOUR) 650 MG CR tablet Take 1-2 tablets by mouth as needed.   ALPRAZolam (XANAX) 0.5 MG tablet Take 0.5 mg by mouth at bedtime.   BIOTIN PO Take 1 capsule by mouth daily.   denosumab (PROLIA) 60 MG/ML SOSY injection Inject 60 mg into the skin every 6 (six) months.   diclofenac Sodium (VOLTAREN) 1 % GEL Apply 1 application topically 3 (three) times daily as needed (pain).   famotidine (PEPCID) 40 MG tablet Take 80 mg by mouth daily before supper. Alternates every 14 days with Protonix   Ferrous Sulfate 27 MG TABS Take 1 tablet by mouth daily. Mega Food, Blood Builder   hydrocortisone cream 1 % Apply 1 application topically daily as needed for itching (wound care).   hyoscyamine (LEVSIN SL) 0.125 MG SL tablet PLACE 0.125 MG UNDER THE TONGUE AS NEEDED.   NON FORMULARY Take 1-2 capsules by mouth with breakfast, with lunch, and with evening meal. Digest  Basic enzyme  1-2 w/meals    ondansetron (ZOFRAN) 4 MG tablet Take 1 tablet (4 mg total) by mouth every 8 (eight) hours as  needed for nausea or vomiting.   pantoprazole (PROTONIX) 40 MG tablet Take 40 mg by mouth daily.   Peppermint Oil (IBGARD) 90 MG CPCR Take 2 Capfuls by mouth 3 (three) times daily.   Polyethyl Glycol-Propyl Glycol (SYSTANE OP) Place 2 drops into both eyes 2 (two) times daily as needed (dry eyes).    polyethylene glycol (MIRALAX / GLYCOLAX) packet Take 17 g by mouth daily. (Patient taking differently: Take 8.5 g by mouth daily. Sometimes more)   Probiotic Product (ALIGN PO) Take 1 tablet by mouth daily. Pt taking a 1/2 cup of kefir   vitamin C (ASCORBIC ACID) 500 MG tablet Take 500 mg by mouth daily.   Past Medical  History:  Diagnosis Date   Anxiety    Degenerative disc disease    GERD (gastroesophageal reflux disease)    Hiatal hernia 2010   GERD,esophageal dysmotility and paraesophageal hernia on esophogram   Osteopetrosis    degenerative spine disease, lumbar   Scoliosis    Past Surgical History:  Procedure Laterality Date   BIOPSY  02/22/2018   Procedure: BIOPSY;  Surgeon: Tressia Danas, MD;  Location: [facility] ENDOSCOPY;  Service: Gastroenterology;;   BREAST EXCISIONAL BIOPSY Bilateral 08/2002   path: fibrocystic changes.  sclerosing adenosis with microcalcification.   BREAST LUMPECTOMY     COLONOSCOPY  10/2007   screening study.  Dr Rob Bunting.  small internal hemorrhoids, no polyps, no diverticulosis.     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   Review of Systems As per HPI  Objective:   Physical Exam BP 122/72   Pulse 84   Ht 4\' 10"  (1.473 m)   Wt 103 lb 6 oz (46.9 kg)   BMI 21.61 kg/m  Elderly white woman with significant kyphoscoliosis.

## 2023-03-28 NOTE — Patient Instructions (Signed)
VISIT SUMMARY:  During your follow-up visit, we discussed the recurrent flare-ups of your hiatal hernia symptoms, including difficulty swallowing, lack of appetite, and fatigue. We also talked about your abdominal pain, dry mouth, and the potential role of stress and anxiety in your symptoms. You are considering surgical intervention for your hiatal hernia and are exploring options for moving to a senior living community.  YOUR PLAN:  -HIATAL HERNIA: A hiatal hernia occurs when part of the stomach pushes up through the diaphragm. You are experiencing increased flare-ups with symptoms like mucus accumulation, difficulty swallowing, and abdominal pain. We discussed the potential benefits and risks of surgical intervention. Please schedule a follow-up appointment with Dr. Rhona Raider to discuss surgical options. Continue taking Hyoscyamine as needed every 4 hours for pain and Zofran as needed for nausea.  -ANXIETY/STRESS: Stress and anxiety can trigger flare-ups of your hiatal hernia symptoms. Continue with your current stress management strategies to help reduce these triggers.  -SENIOR LIVING: You are considering moving to a senior living community to help manage your stress and overall well-being. Continue exploring your options and discussing them with your family.  INSTRUCTIONS:  Please schedule a follow-up appointment with Dr. Rhona Raider to discuss surgical options for your hiatal hernia.

## 2023-03-29 ENCOUNTER — Telehealth: Payer: Self-pay | Admitting: Internal Medicine

## 2023-03-29 NOTE — Telephone Encounter (Signed)
Inbound call from patient, would like to how long her recovery period would be after her hernia repair before she were to schedule with [facility]. Please advise.

## 2023-03-29 NOTE — Telephone Encounter (Signed)
Spoke with patient & advised that she contact Dr. Lovena Neighbours office to discuss her questions regarding hernia surgery and rehab period. Pt verbalized all understanding.

## 2023-04-24 DIAGNOSIS — M4124 Other idiopathic scoliosis, thoracic region: Secondary | ICD-10-CM | POA: Diagnosis not present

## 2023-04-24 DIAGNOSIS — M25511 Pain in right shoulder: Secondary | ICD-10-CM | POA: Diagnosis not present

## 2023-04-24 DIAGNOSIS — M546 Pain in thoracic spine: Secondary | ICD-10-CM | POA: Diagnosis not present

## 2023-04-24 DIAGNOSIS — M4004 Postural kyphosis, thoracic region: Secondary | ICD-10-CM | POA: Diagnosis not present

## 2023-05-10 DIAGNOSIS — Z0000 Encounter for general adult medical examination without abnormal findings: Secondary | ICD-10-CM | POA: Diagnosis not present

## 2023-05-10 DIAGNOSIS — M810 Age-related osteoporosis without current pathological fracture: Secondary | ICD-10-CM | POA: Diagnosis not present

## 2023-05-10 DIAGNOSIS — K909 Intestinal malabsorption, unspecified: Secondary | ICD-10-CM | POA: Diagnosis not present

## 2023-05-10 DIAGNOSIS — D500 Iron deficiency anemia secondary to blood loss (chronic): Secondary | ICD-10-CM | POA: Diagnosis not present

## 2023-05-10 DIAGNOSIS — Z1331 Encounter for screening for depression: Secondary | ICD-10-CM | POA: Diagnosis not present

## 2023-05-10 DIAGNOSIS — Z5181 Encounter for therapeutic drug level monitoring: Secondary | ICD-10-CM | POA: Diagnosis not present

## 2023-05-25 DIAGNOSIS — M25511 Pain in right shoulder: Secondary | ICD-10-CM | POA: Diagnosis not present

## 2023-05-25 DIAGNOSIS — M4124 Other idiopathic scoliosis, thoracic region: Secondary | ICD-10-CM | POA: Diagnosis not present

## 2023-05-25 DIAGNOSIS — M4004 Postural kyphosis, thoracic region: Secondary | ICD-10-CM | POA: Diagnosis not present

## 2023-05-25 DIAGNOSIS — M546 Pain in thoracic spine: Secondary | ICD-10-CM | POA: Diagnosis not present

## 2023-06-11 DIAGNOSIS — M25511 Pain in right shoulder: Secondary | ICD-10-CM | POA: Diagnosis not present

## 2023-06-11 DIAGNOSIS — M4004 Postural kyphosis, thoracic region: Secondary | ICD-10-CM | POA: Diagnosis not present

## 2023-06-11 DIAGNOSIS — M546 Pain in thoracic spine: Secondary | ICD-10-CM | POA: Diagnosis not present

## 2023-06-11 DIAGNOSIS — M4124 Other idiopathic scoliosis, thoracic region: Secondary | ICD-10-CM | POA: Diagnosis not present

## 2023-07-04 DIAGNOSIS — M4124 Other idiopathic scoliosis, thoracic region: Secondary | ICD-10-CM | POA: Diagnosis not present

## 2023-07-04 DIAGNOSIS — M546 Pain in thoracic spine: Secondary | ICD-10-CM | POA: Diagnosis not present

## 2023-07-04 DIAGNOSIS — M25511 Pain in right shoulder: Secondary | ICD-10-CM | POA: Diagnosis not present

## 2023-07-04 DIAGNOSIS — M4004 Postural kyphosis, thoracic region: Secondary | ICD-10-CM | POA: Diagnosis not present

## 2023-07-06 ENCOUNTER — Encounter: Payer: Self-pay | Admitting: Internal Medicine

## 2023-07-10 DIAGNOSIS — M8588 Other specified disorders of bone density and structure, other site: Secondary | ICD-10-CM | POA: Diagnosis not present

## 2023-07-10 DIAGNOSIS — M419 Scoliosis, unspecified: Secondary | ICD-10-CM | POA: Diagnosis not present

## 2023-07-10 DIAGNOSIS — N958 Other specified menopausal and perimenopausal disorders: Secondary | ICD-10-CM | POA: Diagnosis not present

## 2023-07-10 DIAGNOSIS — E2839 Other primary ovarian failure: Secondary | ICD-10-CM | POA: Diagnosis not present

## 2023-07-31 DIAGNOSIS — M810 Age-related osteoporosis without current pathological fracture: Secondary | ICD-10-CM | POA: Diagnosis not present

## 2023-08-15 DIAGNOSIS — M546 Pain in thoracic spine: Secondary | ICD-10-CM | POA: Diagnosis not present

## 2023-08-15 DIAGNOSIS — M4004 Postural kyphosis, thoracic region: Secondary | ICD-10-CM | POA: Diagnosis not present

## 2023-08-15 DIAGNOSIS — M25511 Pain in right shoulder: Secondary | ICD-10-CM | POA: Diagnosis not present

## 2023-08-15 DIAGNOSIS — M4124 Other idiopathic scoliosis, thoracic region: Secondary | ICD-10-CM | POA: Diagnosis not present

## 2023-08-21 DIAGNOSIS — L570 Actinic keratosis: Secondary | ICD-10-CM | POA: Diagnosis not present

## 2023-08-21 DIAGNOSIS — L821 Other seborrheic keratosis: Secondary | ICD-10-CM | POA: Diagnosis not present

## 2023-09-05 ENCOUNTER — Emergency Department (HOSPITAL_COMMUNITY)

## 2023-09-05 ENCOUNTER — Emergency Department (HOSPITAL_COMMUNITY)
Admission: EM | Admit: 2023-09-05 | Discharge: 2023-09-05 | Disposition: A | Discharge status: Home / Self Care | Attending: Emergency Medicine | Admitting: Emergency Medicine

## 2023-09-05 ENCOUNTER — Encounter (HOSPITAL_COMMUNITY): Payer: Self-pay | Admitting: Emergency Medicine

## 2023-09-05 ENCOUNTER — Telehealth: Payer: Self-pay | Admitting: Internal Medicine

## 2023-09-05 ENCOUNTER — Other Ambulatory Visit: Payer: Self-pay

## 2023-09-05 DIAGNOSIS — R109 Unspecified abdominal pain: Secondary | ICD-10-CM | POA: Diagnosis present

## 2023-09-05 DIAGNOSIS — K7689 Other specified diseases of liver: Secondary | ICD-10-CM | POA: Diagnosis not present

## 2023-09-05 DIAGNOSIS — K449 Diaphragmatic hernia without obstruction or gangrene: Secondary | ICD-10-CM | POA: Diagnosis not present

## 2023-09-05 DIAGNOSIS — R1013 Epigastric pain: Secondary | ICD-10-CM | POA: Diagnosis not present

## 2023-09-05 LAB — CBC WITH DIFFERENTIAL/PLATELET
Abs Immature Granulocytes: 0.02 10*3/uL (ref 0.00–0.07)
Basophils Absolute: 0.1 10*3/uL (ref 0.0–0.1)
Basophils Relative: 1 %
Eosinophils Absolute: 0 10*3/uL (ref 0.0–0.5)
Eosinophils Relative: 0 %
HCT: 38.2 % (ref 36.0–46.0)
Hemoglobin: 12.5 g/dL (ref 12.0–15.0)
Immature Granulocytes: 0 %
Lymphocytes Relative: 23 %
Lymphs Abs: 1.7 10*3/uL (ref 0.7–4.0)
MCH: 32 pg (ref 26.0–34.0)
MCHC: 32.7 g/dL (ref 30.0–36.0)
MCV: 97.7 fL (ref 80.0–100.0)
Monocytes Absolute: 0.9 10*3/uL (ref 0.1–1.0)
Monocytes Relative: 13 %
Neutro Abs: 4.6 10*3/uL (ref 1.7–7.7)
Neutrophils Relative %: 63 %
Platelets: 296 10*3/uL (ref 150–400)
RBC: 3.91 MIL/uL (ref 3.87–5.11)
RDW: 12.8 % (ref 11.5–15.5)
WBC: 7.3 10*3/uL (ref 4.0–10.5)
nRBC: 0 % (ref 0.0–0.2)

## 2023-09-05 LAB — I-STAT CHEM 8, ED
BUN: 20 mg/dL (ref 8–23)
Calcium, Ion: 1.2 mmol/L (ref 1.15–1.40)
Chloride: 97 mmol/L — ABNORMAL LOW (ref 98–111)
Creatinine, Ser: 0.8 mg/dL (ref 0.44–1.00)
Glucose, Bld: 108 mg/dL — ABNORMAL HIGH (ref 70–99)
HCT: 39 % (ref 36.0–46.0)
Hemoglobin: 13.3 g/dL (ref 12.0–15.0)
Potassium: 3.8 mmol/L (ref 3.5–5.1)
Sodium: 135 mmol/L (ref 135–145)
TCO2: 28 mmol/L (ref 22–32)

## 2023-09-05 MED ORDER — IOHEXOL 300 MG/ML  SOLN
80.0000 mL | Freq: Once | INTRAMUSCULAR | Status: AC | PRN
  Administered 2023-09-05: 80 mL via INTRAVENOUS

## 2023-09-05 MED ORDER — IOHEXOL 300 MG/ML  SOLN: 100.0000 mL | Freq: Once | INTRAMUSCULAR | Status: DC | PRN

## 2023-09-05 MED ORDER — ONDANSETRON HCL 4 MG/2ML IJ SOLN
4.0000 mg | Freq: Once | INTRAMUSCULAR | Status: AC
  Administered 2023-09-05: 4 mg via INTRAVENOUS
  Filled 2023-09-05: qty 2

## 2023-09-05 NOTE — Telephone Encounter (Signed)
 Dr Lucretia Kern Do you want me to alert the hospital team?

## 2023-09-05 NOTE — Discharge Instructions (Signed)
 We have sent a message to Dr. Leone Payor, to review your CT scan and speak with you. The CT scan does not show any evidence of obstruction. Please follow-up with the surgeon at [facility] as soon as possible.  Return to the ER if you start having severe nausea and vomiting, and severe pain.

## 2023-09-05 NOTE — ED Provider Notes (Signed)
 [place] EMERGENCY DEPARTMENT AT [facility] Provider Note   CSN: [id number] Arrival date & time: 09/05/23  1536     History {Add pertinent medical, surgical, social history, OB history to HPI:1} Chief Complaint  Patient presents with   Medical Clearance    Jennifer Kirk is a 88 y.o. female.  HPI     88 year old female comes in with chief complaint of persistent and worsening abdominal pain/chest pain.  Patient has been diagnosed with large hiatal hernia.  She has had complications such as small bowel obstruction as well.  She comes into the ER because over the last 7 to 10 days, she has been having significant discomfort over her chest anytime she eats.  She is also having profound nausea every time she eats and has been requiring Zofran around-the-clock.  Patient has followed up with Dr. Leone Payor, GI and general surgeon at [facility].  She was offered surgery few years back, but she was not ready to pursue surgical intervention at that time.  Her care team was able to get her symptoms and controlled with medical management until this recent episode.  Patient indicates that she is able to keep fluid down and has been taking soft diet only.  Home Medications Prior to Admission medications   Medication Sig Start Date End Date Taking? Authorizing Provider  acetaminophen (TYLENOL 8 HOUR) 650 MG CR tablet Take 1-2 tablets by mouth as needed.    [provider]  ALPRAZolam Prudy Feeler) 0.5 MG tablet Take 0.5 mg by mouth at bedtime. 11/07/21   [provider]  BIOTIN PO Take 1 capsule by mouth daily.    [provider]  denosumab (PROLIA) 60 MG/ML SOSY injection Inject 60 mg into the skin every 6 (six) months.    [provider]  diclofenac Sodium (VOLTAREN) 1 % GEL Apply 1 application topically 3 (three) times daily as needed (pain).    [provider]  famotidine (PEPCID) 40 MG tablet Take 80 mg by mouth daily before supper. Alternates  every 14 days with Protonix    [provider]  Ferrous Sulfate 27 MG TABS Take 1 tablet by mouth daily. Mega Food, Blood Builder    [provider]  hydrocortisone cream 1 % Apply 1 application topically daily as needed for itching (wound care).    [provider]  hyoscyamine (LEVSIN SL) 0.125 MG SL tablet Take 1 tablet (0.125 mg total) by mouth every 4 (four) hours as needed for cramping. 03/28/23   Iva Boop, MD  NON FORMULARY Take 1-2 capsules by mouth with breakfast, with lunch, and with evening meal. Digest  Basic enzyme  1-2 w/meals     [provider]  ondansetron (ZOFRAN) 4 MG tablet Take 1 tablet (4 mg total) by mouth every 4 (four) hours as needed for nausea or vomiting. 03/28/23   Iva Boop, MD  pantoprazole (PROTONIX) 40 MG tablet Take 40 mg by mouth daily.    [provider]  Peppermint Oil (IBGARD) 90 MG CPCR Take 2 Capfuls by mouth 3 (three) times daily.    [provider]  Polyethyl Glycol-Propyl Glycol (SYSTANE OP) Place 2 drops into both eyes 2 (two) times daily as needed (dry eyes).     [provider]  polyethylene glycol (MIRALAX / GLYCOLAX) packet Take 17 g by mouth daily. Patient taking differently: Take 8.5 g by mouth daily. Sometimes more 05/22/18   Alwyn Ren, MD  Probiotic Product (ALIGN PO) Take  1 tablet by mouth daily. Pt taking a 1/2 cup of kefir    [provider]  vitamin C (ASCORBIC ACID) 500 MG tablet Take 500 mg by mouth daily.    [provider]      Allergies    Codeine, Penicillins, Mobic [meloxicam], and Tramadol    Review of Systems   Review of Systems  All other systems reviewed and are negative.   Physical Exam Updated Vital Signs BP (!) 154/80 (BP Location: Left Arm)   Pulse (!) 101   Temp 98 F (36.7 C) (Oral)   Resp 16   SpO2 98%  Physical Exam Vitals and nursing note reviewed.  Constitutional:      Appearance: She is well-developed.   HENT:     Head: Atraumatic.  Eyes:     Extraocular Movements: Extraocular movements intact.     Pupils: Pupils are equal, round, and reactive to light.  Cardiovascular:     Rate and Rhythm: Normal rate.  Pulmonary:     Effort: Pulmonary effort is normal.  Abdominal:     Tenderness: There is no abdominal tenderness.  Musculoskeletal:     Cervical back: Normal range of motion and neck supple.  Skin:    General: Skin is warm and dry.  Neurological:     Mental Status: She is alert and oriented to person, place, and time.     ED Results / Procedures / Treatments   Labs (all labs ordered are listed, but only abnormal results are displayed) Labs Reviewed  I-STAT CHEM 8, ED - Abnormal; Notable for the following components:      Result Value   Chloride 97 (*)    Glucose, Bld 108 (*)    All other components within normal limits  CBC WITH DIFFERENTIAL/PLATELET    EKG None  Radiology No results found.  Procedures Procedures  {Document cardiac monitor, telemetry assessment procedure when appropriate:1}  Medications Ordered in ED Medications  ondansetron (ZOFRAN) injection 4 mg (has no administration in time range)    ED Course/ Medical Decision Making/ A&P   {   Click here for ABCD2, HEART and other calculatorsREFRESH Note before signing :1}                              Medical Decision Making Amount and/or Complexity of Data Reviewed Labs: ordered. Radiology: ordered.  Risk Prescription drug management.  This patient presents to the ED with chief complaint(s) of chest wall and abdominal discomfort, difficulty swallowing, dry heaving -all present for the last few days with pertinent past medical history of large hiatal hernia and previous history of small obstruction.The complaint involves an extensive differential diagnosis and also carries with it a high risk of complications and morbidity.    The differential diagnosis includes : Gastric volvulus, small  obstruction, worsening hernia, esophagitis, esophageal strictures, PUD  The initial plan is to get basic labs.  We will proceed with CT chest and CT abdomen pelvis with contrast.   Additional history obtained: Records reviewed  outpatient notes including from GI and also reviewed care everywhere to review notes from [facility].  Independent labs interpretation:  The following labs were independently interpreted: Renal function is appropriate.  CBC is reassuring.  Independent visualization and interpretation of imaging: - I independently visualized the following imaging with scope of interpretation limited to determining acute life threatening conditions related to emergency care: ***, which revealed ***  Treatment and Reassessment: ***  Consultation: - Consulted or discussed management/test interpretation with external professional: ***  Consideration for admission or further workup:  Social Determinants of health:    Final Clinical Impression(s) / ED Diagnoses Final diagnoses:  None    Rx / DC Orders ED Discharge Orders     None

## 2023-09-05 NOTE — Telephone Encounter (Signed)
 Patient called and stated that she was actually going to go ahead and driver herself because it will be a while until her son is able to come and get her. Patient stated that she is also prepare in case she has to stay and will be in contact with her son if case she doe send up staying at the hospital. Patient advised me that she would be getting to the hospital around 2-2:30 pm. Please advise.

## 2023-09-05 NOTE — Telephone Encounter (Signed)
 Patient called requesting to speak with a nurse states she is having trouble swallowing and she gets a choking sensation. She takes Zofran but it is not helping at this time. Lower GI issues are having changes with her bowels.

## 2023-09-05 NOTE — ED Triage Notes (Signed)
 Patient was sent her by her MD for abdominal scans. She has a hernia history and well as an extensive GI history. Her MD wants scans to monitor GI issues.

## 2023-09-05 NOTE — Telephone Encounter (Signed)
   I called her.  She is having significant difficulty with swallowing and some left upper quadrant pain.  I have some concern that she could have partial gastric volvulus with this hiatal hernia.  She also has had a history of small bowel obstructions related to the severe hernia.  She can stay on Protonix the osteoporosis risk is of unclear certainty.  I recommended she get checked out in the emergency department where she could have a chest and abdominal CT scan to understand the severity of the problem.  At her age I think there is limited surgical options but I am concerned that she may need decompression of a potential gastric volvulus.

## 2023-09-05 NOTE — Telephone Encounter (Signed)
 Patient has been doing well until 3/22 when she made her transition to the Pepcid. She alternates every week and a half between Pepcid and Protonix. She became"sick" and went back on the Protonix. She began feeling better in a few days. She has remained on Protonix since this time. She is still having to take Zofran 2 to 3 times a day, Levsin at least daily and her diet remains soft to liquid. She feels everything she swallows, sometimes choking or gagging. He abdomen is at times "gripping" but Levsin helps with this.  She asks how long can she safely remain on Protonix with her osteoporosis? She is scheduled in May. Does she need to come in sooner?

## 2023-09-12 DIAGNOSIS — M4004 Postural kyphosis, thoracic region: Secondary | ICD-10-CM | POA: Diagnosis not present

## 2023-09-12 DIAGNOSIS — M4124 Other idiopathic scoliosis, thoracic region: Secondary | ICD-10-CM | POA: Diagnosis not present

## 2023-09-12 DIAGNOSIS — M25511 Pain in right shoulder: Secondary | ICD-10-CM | POA: Diagnosis not present

## 2023-09-12 DIAGNOSIS — M546 Pain in thoracic spine: Secondary | ICD-10-CM | POA: Diagnosis not present

## 2023-10-12 ENCOUNTER — Ambulatory Visit (INDEPENDENT_AMBULATORY_CARE_PROVIDER_SITE_OTHER): Payer: Medicare Other | Admitting: Internal Medicine

## 2023-10-12 ENCOUNTER — Encounter: Payer: Self-pay | Admitting: Internal Medicine

## 2023-10-12 VITALS — BP 124/66 | HR 62 | Ht <= 58 in | Wt 101.0 lb

## 2023-10-12 DIAGNOSIS — K449 Diaphragmatic hernia without obstruction or gangrene: Secondary | ICD-10-CM

## 2023-10-12 DIAGNOSIS — K224 Dyskinesia of esophagus: Secondary | ICD-10-CM

## 2023-10-12 DIAGNOSIS — R1319 Other dysphagia: Secondary | ICD-10-CM

## 2023-10-12 DIAGNOSIS — R1310 Dysphagia, unspecified: Secondary | ICD-10-CM

## 2023-10-12 DIAGNOSIS — Z8719 Personal history of other diseases of the digestive system: Secondary | ICD-10-CM | POA: Diagnosis not present

## 2023-10-12 NOTE — Progress Notes (Signed)
 Jennifer Kirk 88 y.o. 1936-04-22 [id number]  Assessment & Plan:   Encounter Diagnoses  Name Primary?   Large hiatal hernia plus small bowel and pancreatic tail Yes   Esophageal dysmotility    Esophageal dysphagia    History of small bowel obstruction    She does not want surgery and that is reasonable.  Hiatal hernia repair in her case would likely proved to be difficult and a long recovery.  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  She is overall comfortable dealing with these intermittent episodes dysphagia and hiatal hernia problems.  She is aware there is a rare possibility of a gastric volvulus which could be life-threatening.  As I was reviewing everything today I looked at the images of her barium swallow performed in 2023.  Perhaps there is a stricture that could be dilated, at the GE junction.  My inclination is it is probably related to the hiatal hernia and angulation and tortuous esophagus.  I have offered an EGD with potential balloon dilation which I would do at the hospital.  We reviewed the risks benefits and indications.  She will discuss with her sons.  If they want to speak with me or one of them does I will do so to review it.    I will wait to hear from her. CC: Benedetta Bradley, MD     Subjective:   Chief Complaint: Nausea dysphagia hiatal hernia  HPI 88 year old woman with a known very large hiatal hernia, has been evaluated at [facility] for possible surgery, the patient has not pursued that so far.  She called me in April stating she was having more dysphagia and regurgitation problems.  I was concerned about potential hiatal hernia volvulus or bowel obstruction problems and had her go to the ED where a CT scan was performed.  CT chest abdomen and pelvis 09/05/2023 IMPRESSION: No acute intrathoracic or intra-abdominal abnormality.   1. Redemonstration of a  large hiatal hernia containing the majority of the stomach as well as a short loop of small bowel, likely a portion of the transverse colon, the pancreatic tail. No associated findings to suggest bowel obstruction or ischemia. 2. Aortic Atherosclerosis (ICD10-I70.0) including left anterior descending coronary calcification.  She says that her acute symptoms that she had phoned me about or really resolving or resolved by the time she got to the ER and had the CT.  She felt like she was somewhat obstructed again and she was regurgitating and having small-volume emesis and difficulty swallowing.  She has done well since that time though she did have a recent episode where she had dysphagia and had to wait for a food bolus to pass.  She is continued on pantoprazole  having switched from Pepcid  to that back in April.  She has been concerned about osteoporosis and PPI.  She has not been back to [facility] about the hiatal hernia repair since 2023, I think in today she again indicates she is not inclined to pursue that.  She says she has a good quality of life she manages though she has problems and she is concerned about major surgery and the aftereffects. She does continue to experience nausea which she treats successfully with ondansetron .  Mostly that is when she has episodes or flares but it can occur at other times.  She also continues to use hyoscyamine  when she is having a  flare of her symptoms. Wt Readings from Last 3 Encounters:  10/12/23 101 lb (45.8 kg)  03/28/23 103 lb 6 oz (46.9 kg)  09/19/22 105 lb 6.4 oz (47.8 kg)    Barium swallow with tablet 12/12/2021 IMPRESSION: 1. Large hiatal hernia, as was demonstrated on the recent prior CT abdomen/pelvis of 11/15/2021. In addition to the hiatal hernia containing the majority of the stomach, it also contains small bowel loops and (appreciated on the prior CT) a portion of the pancreatic tail. 2. A 13 mm barium tablet was significantly delayed  at the level of the distal esophagus/GE junction (by 4-5 minutes). However, the tablet ultimately passed into the stomach with the patient taking additional contrast and warm water. This delay may be due to tortuosity of the distal esophagus. However, a mild smooth distal esophageal stricture cannot be excluded. Consider endoscopy for further evaluation. 3. Moderate intermittent esophageal dysmotility with tertiary contractions. 4. Prominent cricopharyngeus muscle impression upon the posterior aspect of the lower hypopharynx/upper cervical esophagus.      Last EGD was 02/22/2018 with a 6 cm hiatal hernia and a very tortuous esophagus.  A colonoscopy the next day showed severe diverticulosis but no other abnormalities.  Allergies  Allergen Reactions   Codeine Nausea And Vomiting   Penicillins Rash    Has patient had a PCN reaction causing immediate rash, facial/tongue/throat swelling, SOB or lightheadedness with hypotension: Yes Has patient had a PCN reaction causing severe rash involving mucus membranes or skin necrosis: No Has patient had a PCN reaction that required hospitalization: No Has patient had a PCN reaction occurring within the last 10 years: No If all of the above answers are "NO", then may proceed with Cephalosporin use.    Mobic [Meloxicam] Other (See Comments)    Loss of blood per patient   Tramadol Nausea Only    Intolerance   Current Meds  Medication Sig   acetaminophen  (TYLENOL  8 HOUR) 650 MG CR tablet Take 1-2 tablets by mouth as needed.   ALPRAZolam  (XANAX ) 0.5 MG tablet Take 0.5 mg by mouth at bedtime.   denosumab  (PROLIA ) 60 MG/ML SOSY injection Inject 60 mg into the skin every 6 (six) months.   diclofenac Sodium (VOLTAREN) 1 % GEL Apply 1 application topically 3 (three) times daily as needed (pain).   Ferrous Sulfate  27 MG TABS Take 1 tablet by mouth daily. [employer], Blood Builder   hydrocortisone cream 1 % Apply 1 application topically daily as needed for  itching (wound care).   hyoscyamine  (LEVSIN SL) 0.125 MG SL tablet Take 1 tablet (0.125 mg total) by mouth every 4 (four) hours as needed for cramping.   ondansetron  (ZOFRAN ) 4 MG tablet Take 1 tablet (4 mg total) by mouth every 4 (four) hours as needed for nausea or vomiting.   pantoprazole  (PROTONIX ) 40 MG tablet Take 40 mg by mouth daily.   Peppermint Oil (IBGARD) 90 MG CPCR Take 2 Capfuls by mouth 3 (three) times daily.   Polyethyl Glycol-Propyl Glycol (SYSTANE OP) Place 2 drops into both eyes 2 (two) times daily as needed (dry eyes).    polyethylene glycol (MIRALAX  / GLYCOLAX ) packet Take 17 g by mouth daily. (Patient taking differently: Take 8.5 g by mouth daily. Sometimes more)   [DISCONTINUED] famotidine  (PEPCID ) 40 MG tablet Take 80 mg by mouth daily before supper. Alternates every 14 days with Protonix    Past Medical History:  Diagnosis Date   Anxiety    Degenerative disc disease    GERD (gastroesophageal  reflux disease)    Hiatal hernia 2010   GERD,esophageal dysmotility and paraesophageal hernia on esophogram   Osteopetrosis    degenerative spine disease, lumbar   Scoliosis    Past Surgical History:  Procedure Laterality Date   BIOPSY  02/22/2018   Procedure: BIOPSY;  Surgeon: Lindle Rhea, MD;  Location: [facility] ENDOSCOPY;  Service: Gastroenterology;;   BREAST EXCISIONAL BIOPSY Bilateral 08/2002   path: fibrocystic changes.  sclerosing adenosis with microcalcification.   BREAST LUMPECTOMY     COLONOSCOPY  10/2007   screening study.  Dr Hoyt Macleod.  small internal hemorrhoids, no polyps, no diverticulosis.     COLONOSCOPY WITH PROPOFOL  N/A 02/23/2018   Procedure: COLONOSCOPY WITH PROPOFOL ;  Surgeon: Kenney Peacemaker, MD;  Location[facility] ENDOSCOPY;  Service: Endoscopy;  Laterality: N/A;   ESOPHAGOGASTRODUODENOSCOPY N/A 02/22/2018   Procedure: ESOPHAGOGASTRODUODENOSCOPY (EGD);  Surgeon: Lindle Rhea, MD;  Location: [facility] ENDOSCOPY;  Service: Gastroenterology;  Laterality:  N/A;   MOHS SURGERY  05/02/2022   Dr Hillard Lowes in [place], on her forehead for squamous cell   Social History   Social History Narrative   The patient is widowed - 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   Review of Systems As per HPI  Objective:   Physical Exam @BP  124/66   Pulse 62   Ht 4\' 10"  (1.473 m)   Wt 101 lb (45.8 kg)   BMI 21.11 kg/m @  General:  NAD petite elderly woman Eyes:   anicteric Lungs:  Clear severe kyphoscoliosis Heart::  S1S2 no rubs, murmurs or gallops Abdomen:  soft and nontender, BS+     Data Reviewed:  See HPI

## 2023-10-12 NOTE — Patient Instructions (Signed)
 After talking with your sons let us  know if you want to proceed with an EGD at the hospital.  Suella Emmer to see you today.   I appreciate the opportunity to care for you. Loy Ruff, MD, [facility]

## 2023-10-24 ENCOUNTER — Telehealth: Payer: Self-pay | Admitting: Internal Medicine

## 2023-10-24 NOTE — Telephone Encounter (Signed)
 Spoke with patient. She reports that she has spoken with her sons and she would like to proceed with the EGD as previously discussed. Case scheduled for 12/18/2023. Instructions printed and mailed to patient at this time.

## 2023-10-24 NOTE — Telephone Encounter (Signed)
 Inbound call from patient, would like a call to discuss EGD at the hospital.

## 2023-11-06 DIAGNOSIS — M4004 Postural kyphosis, thoracic region: Secondary | ICD-10-CM | POA: Diagnosis not present

## 2023-11-06 DIAGNOSIS — M4124 Other idiopathic scoliosis, thoracic region: Secondary | ICD-10-CM | POA: Diagnosis not present

## 2023-11-13 ENCOUNTER — Telehealth: Payer: Self-pay | Admitting: Internal Medicine

## 2023-11-13 NOTE — Telephone Encounter (Signed)
 Patient called and stated that she wants to speak to the nurse regarding her arrival time for her procedure at the hospital scheduled for July the 15 th. Patient stated that she has to have a process that she goes through in order for her to get out of bed. Patient want to know that possibility of rescheduling for a later time. Patient is requesting a call back. Please advise.

## 2023-11-13 NOTE — Telephone Encounter (Signed)
 I spoke with the pt and made her aware that we have no later appts. She has agreed to keep the appt as planned. We went over the instructions again and all questions answered to the best of my ability.

## 2023-11-22 NOTE — Telephone Encounter (Signed)
 Son called and spoke with me. He reports that he is very concerned regarding the patients rapid weight loss. Reports that the patient is down to 94 pounds. He is concerned at the rate she is losing weight she will be malnourished before she can have the procedure. He is also concerned about the start time that the patient is currently scheduled for the earliest arrival. He is asking if he can check his mother in overnight. He would like to know if there is anything that can be done for either of these issues. His number is [phone], his name is Marijean Shouts.

## 2023-11-22 NOTE — Telephone Encounter (Signed)
 PT is calling to find out if the procedure on 7/15 be scheduled earlier. Her condition is getting worse. She cannot eat and has lost 2lbs since Friday. She is very concerned. Please advise.

## 2023-11-22 NOTE — Telephone Encounter (Signed)
 Spoke to patient - told her I would see what I could do about an earlier appointment but cannot promise.  She wants to have a later appointment for 7/15 also - if she keeps that.

## 2023-11-22 NOTE — Telephone Encounter (Signed)
 Spoke with patient, she states she is very concerned because she is not able to eat much at all. She reports she has lost weight and is having to take her Zofran  frequently. Advised patient that there are no sooner appointments for procedures. Advised patient that if her symptoms were that severe recommend that she goes to the ER. Patient refused. I've been down that route, and I am not doing it again. Patient states, my son is an attorney in [place] and he will be calling Dr. Willy Harvest today to determine what to do next. Advised patient that this RN would send message to Dr. Willy Harvest. Patient states she would like to know what he recommends.

## 2023-11-23 ENCOUNTER — Encounter: Payer: Self-pay | Admitting: Internal Medicine

## 2023-11-23 ENCOUNTER — Other Ambulatory Visit: Payer: Self-pay

## 2023-11-23 DIAGNOSIS — K449 Diaphragmatic hernia without obstruction or gangrene: Secondary | ICD-10-CM

## 2023-11-23 NOTE — Telephone Encounter (Signed)
 Called patient and she is in agreement for scheduling procedure for 6/24. Spoke with Audrene Blessing at [employer]. Case built and patient placed on schedule. Patient notified that she has been added to the schedule and that she should arrive at 11:30 for check in. Patient verbalized understanding. Offered to call and update patient's son, patient stated that she would call him and give him the update. Patient was very thankful to staff and Dr. Willy Harvest for being able to move her appointment up so quickly.

## 2023-11-23 NOTE — Telephone Encounter (Signed)
 This encounter was created in error - please disregard.

## 2023-11-23 NOTE — Telephone Encounter (Signed)
 Called son and left a message that I was working on an earlier appointment

## 2023-11-23 NOTE — Telephone Encounter (Signed)
 PT son is returning Dr. Jadene Maxwell call. Please advise.

## 2023-11-23 NOTE — Telephone Encounter (Signed)
 I have been able to arrange EGD with possible balloon dilation at 1:00 at [facility] on Tuesday, June 24.  I am not working that day but I can come in and do it.  As you will see I got the son's voicemail.  Please contact the patient and see if she could make this work and set that up.  You would need to call Seretha Dance the charge nurse at [facility] at [phone] to coordinate scheduling.  I have been communicating with her so she is aware.  Thanks.  I await your response.

## 2023-11-26 ENCOUNTER — Telehealth: Payer: Self-pay | Admitting: Internal Medicine

## 2023-11-26 NOTE — Telephone Encounter (Signed)
 Called and spoke with patient. Instructions also sent in My Chart. All questions answered. Discussed medications that it is ok for patient to take. Understanding verbalized.

## 2023-11-26 NOTE — Telephone Encounter (Signed)
 Patient is calling and stating that she would like to go over her instruction for her EGD and well as list of medication she is able to take. Patient is schedule for a EGD at the [facility] on June the 24 th. Patient is requesting a call back. Please advise.

## 2023-11-26 NOTE — Telephone Encounter (Signed)
 Patient states she is having trouble logging into MyChart, will have son attempt to log in, so she can try to access.

## 2023-11-26 NOTE — Anesthesia Preprocedure Evaluation (Signed)
 Anesthesia Evaluation  Patient identified by MRN, date of birth, ID band Patient awake    Reviewed: Allergy & Precautions, NPO status , Patient's Chart, lab work & pertinent test results  Airway Mallampati: II  TM Distance: >3 FB Neck ROM: Full    Dental no notable dental hx. (+) Partial Upper   Pulmonary neg pulmonary ROS   Pulmonary exam normal breath sounds clear to auscultation       Cardiovascular hypertension, Pt. on medications Normal cardiovascular exam Rhythm:Regular Rate:Normal     Neuro/Psych   Anxiety      Neuromuscular disease  negative psych ROS   GI/Hepatic hiatal hernia,GERD  Medicated and Controlled,,  Endo/Other    Renal/GU      Musculoskeletal negative musculoskeletal ROS (+)    Abdominal   Peds  Hematology Lab Results      Component                Value               Date                      WBC                      7.3                 09/05/2023                HGB                      13.3                09/05/2023                HCT                      39.0                09/05/2023                         PLT                      296                 09/05/2023              Anesthesia Other Findings All: codeine, Pcn, Tramadol, Mobic  Reproductive/Obstetrics negative OB ROS                             Anesthesia Physical Anesthesia Plan  ASA: 3  Anesthesia Plan: MAC   Post-op Pain Management: Minimal or no pain anticipated   Induction:   PONV Risk Score and Plan: Propofol  infusion and Treatment may vary due to age or medical condition  Airway Management Planned: Natural Airway and Nasal Cannula  Additional Equipment: None  Intra-op Plan:   Post-operative Plan:   Informed Consent: I have reviewed the patients History and Physical, chart, labs and discussed the procedure including the risks, benefits and alternatives for the proposed anesthesia with  the patient or authorized representative who has indicated his/her understanding and acceptance.     Dental advisory given  Plan Discussed with: CRNA and Surgeon  Anesthesia Plan Comments: (EGD w Baloon Diliatation)       Anesthesia  Quick Evaluation

## 2023-11-27 ENCOUNTER — Other Ambulatory Visit: Payer: Self-pay

## 2023-11-27 ENCOUNTER — Encounter (HOSPITAL_COMMUNITY)
Admission: RE | Disposition: A | Discharge status: Home / Self Care | Payer: Self-pay | Source: Home / Self Care | Attending: Internal Medicine

## 2023-11-27 ENCOUNTER — Encounter (HOSPITAL_COMMUNITY): Payer: Self-pay | Admitting: Internal Medicine

## 2023-11-27 ENCOUNTER — Ambulatory Visit (HOSPITAL_COMMUNITY)
Admission: RE | Admit: 2023-11-27 | Discharge: 2023-11-27 | Disposition: A | Discharge status: Home / Self Care | Attending: Internal Medicine | Admitting: Internal Medicine

## 2023-11-27 ENCOUNTER — Ambulatory Visit (HOSPITAL_COMMUNITY): Payer: Self-pay | Admitting: Anesthesiology

## 2023-11-27 DIAGNOSIS — K222 Esophageal obstruction: Secondary | ICD-10-CM | POA: Diagnosis not present

## 2023-11-27 DIAGNOSIS — R1310 Dysphagia, unspecified: Secondary | ICD-10-CM | POA: Diagnosis not present

## 2023-11-27 DIAGNOSIS — F419 Anxiety disorder, unspecified: Secondary | ICD-10-CM

## 2023-11-27 DIAGNOSIS — I10 Essential (primary) hypertension: Secondary | ICD-10-CM | POA: Diagnosis not present

## 2023-11-27 DIAGNOSIS — K449 Diaphragmatic hernia without obstruction or gangrene: Secondary | ICD-10-CM

## 2023-11-27 DIAGNOSIS — Q399 Congenital malformation of esophagus, unspecified: Secondary | ICD-10-CM | POA: Diagnosis not present

## 2023-11-27 DIAGNOSIS — R1319 Other dysphagia: Secondary | ICD-10-CM

## 2023-11-27 HISTORY — PX: ESOPHAGOGASTRODUODENOSCOPY: SHX5428

## 2023-11-27 HISTORY — PX: BALLOON DILATION: SHX5330

## 2023-11-27 SURGERY — EGD (ESOPHAGOGASTRODUODENOSCOPY)
Anesthesia: Monitor Anesthesia Care

## 2023-11-27 MED ORDER — PROPOFOL 500 MG/50ML IV EMUL
INTRAVENOUS | Status: DC | PRN
  Administered 2023-11-27 (×2): 30 mg via INTRAVENOUS
  Administered 2023-11-27: 20 mg via INTRAVENOUS
  Administered 2023-11-27: 50 mg via INTRAVENOUS
  Administered 2023-11-27: 65 ug/kg/min via INTRAVENOUS

## 2023-11-27 MED ORDER — DEXMEDETOMIDINE HCL IN NACL 80 MCG/20ML IV SOLN
INTRAVENOUS | Status: DC | PRN
  Administered 2023-11-27: 8 ug via INTRAVENOUS

## 2023-11-27 MED ORDER — SODIUM CHLORIDE 0.9 % IV SOLN
INTRAVENOUS | Status: DC | PRN
  Administered 2023-11-27: 500 mL via INTRAMUSCULAR

## 2023-11-27 MED ORDER — PROPOFOL 500 MG/50ML IV EMUL
INTRAVENOUS | Status: AC
Start: 2023-11-27 — End: 2023-11-27
  Filled 2023-11-27: qty 50

## 2023-11-27 MED ORDER — SODIUM CHLORIDE 0.9 % IV SOLN: INTRAVENOUS | Status: DC

## 2023-11-27 NOTE — H&P (Signed)
 [facility] History and Physical   Primary Care Physician:  Charlott Dorn LABOR, MD   Reason for Procedure:  Dysphagia, hiatal hernia and weight loss  Plan:    EGD with possible dilation     HPI: Jennifer Kirk is a 88 y.o. female with a large hiatal hernia which includes small bowel and tail of the pancreas, with recurrent dysphagia problems and more difficulty eating and complaints of weight loss.  She was on for an EGD 7/15 2025 but she called in so we moved it up.  She has been seen by surgery at [facility] in 2023 but declined to proceed.  She has been managing flares of problems with hyoscyamine  at times and antiemetics.  I had seen her in the office in May and we had decided to pursue an EGD to see if there would be any potential benefit from esophageal dilation given that a tablet administered during barium swallow in 2023 lodged at that distal esophagus and GE junction.  I have thought that that is probably related to the hiatal hernia and altered anatomy as opposed to a stricture.  EGD should help sort that out.    Wt Readings from Last 3 Encounters:  11/27/23 42.6 kg  10/12/23 45.8 kg  03/28/23 46.9 kg    CT chest abdomen and pelvis 09/05/2023 IMPRESSION: No acute intrathoracic or intra-abdominal abnormality.   1. Redemonstration of a large hiatal hernia containing the majority of the stomach as well as a short loop of small bowel, likely a portion of the transverse colon, the pancreatic tail. No associated findings to suggest bowel obstruction or ischemia. 2. Aortic Atherosclerosis (ICD10-I70.0) including left anterior descending coronary calcification.  Barium swallow with tablet 12/12/2021 IMPRESSION: 1. Large hiatal hernia, as was demonstrated on the recent prior CT abdomen/pelvis of 11/15/2021. In addition to the hiatal hernia containing the majority of the stomach, it also contains small bowel loops and (appreciated on the prior CT) a portion of the  pancreatic tail. 2. A 13 mm barium tablet was significantly delayed at the level of the distal esophagus/GE junction (by 4-5 minutes). However, the tablet ultimately passed into the stomach with the patient taking additional contrast and warm water. This delay may be due to tortuosity of the distal esophagus. However, a mild smooth distal esophageal stricture cannot be excluded. Consider endoscopy for further evaluation. 3. Moderate intermittent esophageal dysmotility with tertiary contractions. 4. Prominent cricopharyngeus muscle impression upon the posterior aspect of the lower hypopharynx/upper cervical esophagus.       Last EGD was 02/22/2018 with a 6 cm hiatal hernia and a very tortuous esophagus.  A colonoscopy the next day showed severe diverticulosis but no other abnormalities.    Past Medical History:  Diagnosis Date   Anxiety    Degenerative disc disease    GERD (gastroesophageal reflux disease)    Hiatal hernia 2010   GERD,esophageal dysmotility and paraesophageal hernia on esophogram   Osteopetrosis    degenerative spine disease, lumbar   Scoliosis     Past Surgical History:  Procedure Laterality Date   BIOPSY  02/22/2018   Procedure: BIOPSY;  Surgeon: Eda Iha, MD;  Location: [facility] ENDOSCOPY;  Service: Gastroenterology;;   BREAST EXCISIONAL BIOPSY Bilateral 08/2002   path: fibrocystic changes.  sclerosing adenosis with microcalcification.   BREAST LUMPECTOMY     COLONOSCOPY  10/2007   screening study.  Dr Toribio Cedar.  small internal hemorrhoids, no polyps, no diverticulosis.     COLONOSCOPY WITH PROPOFOL  N/A 02/23/2018  Procedure: COLONOSCOPY WITH PROPOFOL ;  Surgeon: Avram Lupita BRAVO, MD;  Location: [facility] ENDOSCOPY;  Service: Endoscopy;  Laterality: N/A;   ESOPHAGOGASTRODUODENOSCOPY N/A 02/22/2018   Procedure: ESOPHAGOGASTRODUODENOSCOPY (EGD);  Surgeon: Eda Iha, MD;  Location: [facility] ENDOSCOPY;  Service: Gastroenterology;  Laterality: N/A;   MOHS  SURGERY  05/02/2022   Dr Krystal Pouch in [place], on her forehead for squamous cell    Prior to Admission medications   Medication Sig Start Date End Date Taking? Authorizing Provider  acetaminophen  (TYLENOL  8 HOUR) 650 MG CR tablet Take 1-2 tablets by mouth as needed.   Yes [provider]  ALPRAZolam  (XANAX ) 0.5 MG tablet Take 0.5 mg by mouth at bedtime. 11/07/21  Yes [provider]  denosumab  (PROLIA ) 60 MG/ML SOSY injection Inject 60 mg into the skin every 6 (six) months.   Yes [provider]  Ferrous Sulfate  27 MG TABS Take 1 tablet by mouth daily. [employer], Blood Builder   Yes [provider]  hydrocortisone cream 1 % Apply 1 application topically daily as needed for itching (wound care).   Yes [provider]  hyoscyamine  (LEVSIN  SL) 0.125 MG SL tablet Take 1 tablet (0.125 mg total) by mouth every 4 (four) hours as needed for cramping. 03/28/23  Yes Avram Lupita BRAVO, MD  ondansetron  (ZOFRAN ) 4 MG tablet Take 1 tablet (4 mg total) by mouth every 4 (four) hours as needed for nausea or vomiting. 03/28/23  Yes Avram Lupita BRAVO, MD  pantoprazole  (PROTONIX ) 40 MG tablet Take 40 mg by mouth daily.   Yes [provider]  Peppermint Oil (IBGARD) 90 MG CPCR Take 2 Capfuls by mouth 3 (three) times daily.   Yes [provider]  Polyethyl Glycol-Propyl Glycol (SYSTANE OP) Place 2 drops into both eyes 2 (two) times daily as needed (dry eyes).    Yes [provider]  polyethylene glycol (MIRALAX  / GLYCOLAX ) packet Take 17 g by mouth daily. Patient taking differently: Take 8.5 g by mouth daily. Sometimes more 05/22/18  Yes Will Almarie MATSU, MD  diclofenac Sodium (VOLTAREN) 1 % GEL Apply 1 application topically 3 (three) times daily as needed (pain).    [provider]    Current Facility-Administered Medications  Medication Dose Route Frequency Provider Last Rate Last Admin   0.9 %  sodium chloride  infusion    Intravenous Continuous Avram Lupita BRAVO, MD        Allergies as of 11/23/2023 - Review Complete 10/12/2023  Allergen Reaction Noted   Codeine Nausea And Vomiting 08/03/2011   Penicillins Rash 08/03/2011   Mobic [meloxicam] Other (See Comments) 12/24/2018   Tramadol Nausea Only 04/11/2018    Family History  Problem Relation Age of Onset   Stroke Mother    Dementia Father    Heart disease Father    Breast cancer Neg Hx    Colon cancer Neg Hx    Esophageal cancer Neg Hx    Liver cancer Neg Hx    Pancreatic cancer Neg Hx    Rectal cancer Neg Hx     Social History   Socioeconomic History   Marital status: Widowed    Spouse name: Not on file   Number of children: 2   Years of education: Not on file   Highest education level: Not on file  Occupational History   Not on file  Tobacco Use   Smoking status: Never   Smokeless tobacco: Never  Vaping Use   Vaping status: Never Used  Substance and  Sexual Activity   Alcohol use: Not Currently    Comment: small amount of wine    Drug use: No   Sexual activity: Not on file  Other Topics Concern   Not on file  Social History Narrative   The patient is widowed - 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  Physical Exam: Vital signs BP (!) 160/65   Pulse 79   Temp 97.8 F (36.6 C) (Temporal)   Resp 14   Ht 4' 10 (1.473 m)   Wt 42.6 kg   SpO2 95%   BMI 19.65 kg/m   General:   Alert,  Well-developed, well-nourished, pleasant and cooperative in NAD Lungs:  Clear throughout to auscultation.    Heart:  Regular rate and rhythm; no murmurs, clicks, rubs,  or gallops. Abdomen:  Soft, nontender and nondistended. Normal bowel sounds.   Neuro/Psych:  Alert and cooperative. Normal mood and affect. A and O x 3   @Teela Narducci  CHARLENA Commander, MD, [facility] [phone] (pager) 11/27/2023 12:17 PM@

## 2023-11-27 NOTE — Anesthesia Postprocedure Evaluation (Signed)
 Anesthesia Post Note  Patient: Jennifer  B Kirk  Procedure(s) Performed: EGD (ESOPHAGOGASTRODUODENOSCOPY) BALLOON DILATION     Patient location during evaluation: Endoscopy Anesthesia Type: MAC Level of consciousness: awake and alert Pain management: pain level controlled Vital Signs Assessment: post-procedure vital signs reviewed and stable Respiratory status: spontaneous breathing, nonlabored ventilation, respiratory function stable and patient connected to nasal cannula oxygen Cardiovascular status: blood pressure returned to baseline and stable Postop Assessment: no apparent nausea or vomiting Anesthetic complications: no  No notable events documented.  Last Vitals:  Vitals:   11/27/23 1310 11/27/23 1320  BP: (!) 100/45 (!) 108/50  Pulse: 88 83  Resp: 18 19  Temp:    SpO2: 100% 97%    Last Pain:  Vitals:   11/27/23 1306  TempSrc: Temporal  PainSc: 0-No pain                 [name]

## 2023-11-27 NOTE — Transfer of Care (Signed)
 Immediate Anesthesia Transfer of Care Note  Patient: Jennifer Kirk  Procedure(s) Performed: EGD (ESOPHAGOGASTRODUODENOSCOPY) BALLOON DILATION  Patient Location: PACU and Endoscopy Unit  Anesthesia Type:MAC  Level of Consciousness: sedated and responds to stimulation  Airway & Oxygen Therapy: Patient Spontanous Breathing and Patient connected to face mask oxygen  Post-op Assessment: Report given to RN and Post -op Vital signs reviewed and stable  Post vital signs: Reviewed and stable  Last Vitals:  Vitals Value Taken Time  BP 104/42 11/27/23 13:06  Temp    Pulse 90 11/27/23 13:08  Resp 14 11/27/23 13:08  SpO2 100 % 11/27/23 13:08  Vitals shown include unfiled device data.  Last Pain:  Vitals:   11/27/23 1206  TempSrc: Temporal  PainSc: 0-No pain         Complications: No notable events documented.

## 2023-11-27 NOTE — Discharge Instructions (Signed)
 There was a stricture or narrow area where the esophagus and stomach meet.  I dilated that and I am hoping it will help you swallow better.  Please call back with an update in the next 1 to 2 weeks or sooner if you are not any better.   I appreciate the opportunity to care for you. Lupita CHARLENA Commander, MD, FACG  YOU HAD AN ENDOSCOPIC PROCEDURE TODAY: Refer to the procedure report and other information in the discharge instructions given to you for any specific questions about what was found during the examination. If this information does not answer your questions, please call Dr. Darilyn office at [phone] to clarify.   YOU SHOULD EXPECT: Some feelings of bloating in the abdomen. Passage of more gas than usual. Walking can help get rid of the air that was put into your GI tract during the procedure and reduce the bloating. If you had a lower endoscopy (such as a colonoscopy or flexible sigmoidoscopy) you may notice spotting of blood in your stool or on the toilet paper. Some abdominal soreness may be present for a day or two, also.  DIET:  Stay on clear liquids until 2 PM today and then you may move to softer foods and try your normal diet tomorrow or the next day as you gradually advance to more solid foods.  ACTIVITY: Your care partner should take you home directly after the procedure. You should plan to take it easy, moving slowly for the rest of the day. You can resume normal activity the day after the procedure however YOU SHOULD NOT DRIVE, use power tools, machinery or perform tasks that involve climbing or major physical exertion for 24 hours (because of the sedation medicines used during the test).   SYMPTOMS TO REPORT IMMEDIATELY: A gastroenterologist can be reached at any hour. Please call [phone]  for any of the following symptoms:   Following upper endoscopy (EGD, EUS, ERCP, esophageal dilation) Vomiting of blood or coffee ground material  New, significant abdominal pain   New, significant chest pain or pain under the shoulder blades  Painful or persistently difficult swallowing  New shortness of breath  Black, tarry-looking or red, bloody stools  FOLLOW UP:  If any biopsies were taken you will be contacted by phone or by letter within the next 1-3 weeks. Call [phone]  if you have not heard about the biopsies in 3 weeks.  Please also call with any specific questions about appointments or follow up tests.

## 2023-11-27 NOTE — Op Note (Signed)
 [facility] Patient Name: Jennifer Kirk Procedure Date: 11/27/2023 MRN: [id number] Attending MD: Lupita FORBES Commander , MD, [id number] Date of Birth: 04-Mar-1936 CSN: [id number] Age: 88 Admit Type: Ambulatory Procedure:                Upper GI endoscopy Indications:              Dysphagia, Hiatal hernia Providers:                Lupita CHARLENA Commander, MD, Clotilda Schmitz, RN, Fairy Marina, Technician Referring MD:              Medicines:                Monitored Anesthesia Care Complications:            No immediate complications. Estimated Blood Loss:     Estimated blood loss was minimal. Procedure:                Pre-Anesthesia Assessment:                           - Prior to the procedure, a History and Physical                            was performed, and patient medications and                            allergies were reviewed. The patient's tolerance of                            previous anesthesia was also reviewed. The risks                            and benefits of the procedure and the sedation                            options and risks were discussed with the patient.                            All questions were answered, and informed consent                            was obtained. Prior Anticoagulants: The patient has                            taken no anticoagulant or antiplatelet agents. ASA                            Grade Assessment: III - A patient with severe                            systemic disease. After reviewing the risks and  benefits, the patient was deemed in satisfactory                            condition to undergo the procedure.                           After obtaining informed consent, the endoscope was                            passed under direct vision. Throughout the                            procedure, the patient's blood pressure, pulse, and                             oxygen saturations were monitored continuously. The                            [id number]-H190 ([id number]) Olympus endoscope was introduced                            through the mouth, and advanced to the second part                            of duodenum. The upper GI endoscopy was                            accomplished without difficulty. The patient                            tolerated the procedure well. Scope In: Scope Out: Findings:      One benign-appearing, intrinsic moderate (circumferential scarring or       stenosis; an endoscope may pass) stenosis was found at the       gastroesophageal junction. Z-line at about 27 cm from incisors. This       stenosis measured less than one cm (in length). The stenosis was       traversed. A TTS dilator was passed through the scope. Dilation with a       15-16.5-18 mm balloon dilator was performed to 16.5 mm. The dilation       site was examined and showed moderate mucosal disruption. Estimated       blood loss was minimal.      The examined esophagus was moderately tortuous.      A large hiatal hernia was present. The stomach anatomy was distorted       somehwat due to this and difficult to measure but I think at least 8 cm      The gastroesophageal flap valve was visualized endoscopically and       classified as Hill Grade IV (no fold, wide open lumen, hiatal hernia       present).      The exam was otherwise without abnormality.      The cardia and gastric fundus were otherwise normal on retroflexion. Impression:               - Benign-appearing esophageal stenosis. Dilated to  16.5 mm                           - Tortuous esophagus.                           - Large hiatal hernia. At least 8 cm. This distorts                            stomach anatomy somewhat as described above.                           - Gastroesophageal flap valve classified as Hill                            Grade IV (no fold, wide open lumen,  hiatal hernia                            present).                           - The examination was otherwise normal.                           - No specimens collected. Moderate Sedation:      Not Applicable - Patient had care per Anesthesia. Recommendation:           - Patient has a contact number available for                            emergencies. The signs and symptoms of potential                            delayed complications were discussed with the                            patient. Return to normal activities tomorrow.                            Written discharge instructions were provided to the                            patient.                           - Clear liquids x 1 hour then soft foods rest of                            day. Start prior diet tomorrow.                           - Continue present medications.                           - I have asked her to contact me in 1-2 weeks with  symptom update. Hopefully this will help with                            dysphagia enough to allow better intake of food.                            The hiatal hernia may still cause problems as we                            have suspected (it contains stomach, small and                            large bowel and pancreas as seen on CT so is                            complex - she has decided not to pursue surgery) Procedure Code(s):        --- Professional ---                           [phone], Esophagogastroduodenoscopy, flexible,                            transoral; with transendoscopic balloon dilation of                            esophagus (less than 30 mm diameter) Diagnosis Code(s):        --- Professional ---                           K22.2, Esophageal obstruction                           Q39.9, Congenital malformation of esophagus,                            unspecified                           K44.9, Diaphragmatic hernia without obstruction or                             gangrene                           R13.10, Dysphagia, unspecified CPT copyright 2022 American Medical Association. All rights reserved. The codes documented in this report are preliminary and upon coder review may  be revised to meet current compliance requirements. Lupita FORBES Commander, MD 11/27/2023 1:20:58 PM This report has been signed electronically. Number of Addenda: 0

## 2023-11-29 ENCOUNTER — Encounter (HOSPITAL_COMMUNITY): Payer: Self-pay | Admitting: Internal Medicine

## 2023-11-30 MED ORDER — PANTOPRAZOLE SODIUM 40 MG PO TBEC: 40.0000 mg | DELAYED_RELEASE_TABLET | Freq: Two times a day (BID) | ORAL | 3 refills | Status: AC

## 2023-11-30 NOTE — Telephone Encounter (Signed)
 Agree with all of your recommendations.  I have also changed her pantoprazole  to be taken twice daily  Meds ordered this encounter  Medications   pantoprazole  (PROTONIX ) 40 MG tablet    Sig: Take 1 tablet (40 mg total) by mouth 2 (two) times daily before a meal. Before breakfast and supper    Dispense:  180 tablet    Refill:  3   She should take 2 doses of MiraLAX  (she takes a half dose daily) for the next week and she can hold that if she gets diarrhea and then reduce back to her normal 8.5 g daily

## 2023-11-30 NOTE — Addendum Note (Signed)
 Addended by: AVRAM LUPITA BRAVO on: 11/30/2023 12:21 PM   Modules accepted: Orders

## 2023-11-30 NOTE — Telephone Encounter (Signed)
 Called and spoke with patient, she reports that for about an hour last night after lying down she had gastric reflux symptoms. She reports that she took a Zofran  and it did help eventually. She also reports having a gagging sensation that lasted about 15 minutes. She would like to know if she should be taking something else for her heartburn, aside from the Zofran . Discussed lifestyle changes with patient for bedtime routine. These include elevating head to sleep at night, not eating for at least 2-3 hours prior to lying down, and avoiding acid triggering foods like dairy close to bedtime. Patient verbalized understanding. Patient is also reporting constipation. She states she has not had a good bm since prior to procedure on Tuesday. She states she had a small one this morning, but she had to strain and had to have some assistance to get the stool out. Advised patient that she can try an extra dose of Miralax  to see if that will help her go. Patient measures her Miralax  so she will take an extra ounce this morning. She would like to know any other recommendations that Dr. Avram may have.

## 2023-11-30 NOTE — Telephone Encounter (Signed)
 Called and spoke with patient. Relayed provider message. Patient verbalized understanding of all instructions and agrees with plan of care.

## 2023-11-30 NOTE — Telephone Encounter (Signed)
 Inbound call from patient states she is having acid reflux and would like to discuss medication management. Please aneita  Thank you

## 2023-12-04 NOTE — Telephone Encounter (Signed)
 Try alternating half dose with full dose MiraLAX  every other day  Monitor the perceived side effects from PPI.  She could try to go back to once a day but if those reflux symptoms come back she would need to go back on it twice a day.

## 2023-12-04 NOTE — Telephone Encounter (Signed)
 Called and spoke with patient. She wanted to update Dr. Avram on how she is feeling. She reports that she is able to eat and drink much easier since her EGD. She has some concerns with being on PPI twice daily. Reports she has noticed a few side effects including headaches close to the time she takes the medication, mild dizziness, and slight nausea. She reports she takes 1 Tylenol  Arthritis strength tablet when she has these H/As. She reports that when she increased her dose of Miralax  to a full dose, she did develop leaky diarrhea. However now that she has cut it back to half a dose, she's noticing more constipation. She would like to know Dr. Darilyn recommendations on the best way to take the Miralax . Patient requested f/u appointment to further discuss medication management of PPIs. Appointment scheduled for 01/15/2024 at 1330.

## 2023-12-04 NOTE — Telephone Encounter (Signed)
 Patient calling to provide update on medication as well as her care. Requesting f/u call from nurse. Please advise.   Thank you

## 2023-12-05 NOTE — Telephone Encounter (Signed)
 Spoke with patient. Relayed provider message. Patient verbalized understanding.

## 2023-12-13 NOTE — Telephone Encounter (Addendum)
 Inbound call from patient wanting to give an update to [place]. States she is feeling better, able to eat better, and able to tolerate protonix  and states she is able to continue with 2 a day. Requesting to know if she should continue with medication. Please advise, thank you.

## 2023-12-13 NOTE — Telephone Encounter (Signed)
 Called and spoke with patient. Relayed recommendation to remain on Protonix  BID. Patient reports that she seems to be tolerating it well. She reports that she is feeling much better, has been able to eat, and has gained 2 pounds since having the procedure done. Patient was very thankful to Dr. Avram and staff for everything.

## 2023-12-13 NOTE — Telephone Encounter (Signed)
 Continue Protonix  twice daily please.

## 2023-12-18 ENCOUNTER — Ambulatory Visit (HOSPITAL_COMMUNITY): Admit: 2023-12-18 | Admitting: Internal Medicine

## 2023-12-18 ENCOUNTER — Encounter (HOSPITAL_COMMUNITY): Payer: Self-pay

## 2023-12-18 SURGERY — EGD (ESOPHAGOGASTRODUODENOSCOPY)
Anesthesia: Monitor Anesthesia Care

## 2023-12-29 DIAGNOSIS — H60312 Diffuse otitis externa, left ear: Secondary | ICD-10-CM | POA: Diagnosis not present

## 2024-01-15 ENCOUNTER — Ambulatory Visit (INDEPENDENT_AMBULATORY_CARE_PROVIDER_SITE_OTHER): Admitting: Internal Medicine

## 2024-01-15 ENCOUNTER — Encounter: Payer: Self-pay | Admitting: Internal Medicine

## 2024-01-15 VITALS — BP 116/62 | HR 88 | Ht <= 58 in | Wt 98.0 lb

## 2024-01-15 DIAGNOSIS — K449 Diaphragmatic hernia without obstruction or gangrene: Secondary | ICD-10-CM | POA: Diagnosis not present

## 2024-01-15 DIAGNOSIS — K224 Dyskinesia of esophagus: Secondary | ICD-10-CM | POA: Diagnosis not present

## 2024-01-15 DIAGNOSIS — K219 Gastro-esophageal reflux disease without esophagitis: Secondary | ICD-10-CM

## 2024-01-15 DIAGNOSIS — K222 Esophageal obstruction: Secondary | ICD-10-CM | POA: Diagnosis not present

## 2024-01-15 NOTE — Patient Instructions (Addendum)
 Great to see you today.  Continue twice a day usage of the pantoprazole .  Call us  in December for a February appointment.  I appreciate the opportunity to care for you. Lupita Commander, MD, [facility]

## 2024-01-15 NOTE — Progress Notes (Signed)
 Jennifer Kirk  Jennifer Kirk 88 y.o. 04-18-1936 [id number]  Assessment & Plan:   Encounter Diagnoses  Name Primary?   Large hiatal hernia that includes small bowel and pancreatic tail Yes   Esophageal stricture    Esophageal dysmotility    Gastroesophageal reflux disease, unspecified whether esophagitis present     There is significant improvement after dilation of distal esophageal stricture to 16.5 mm.  She will continue twice daily pantoprazole .  We reviewed that there is correlation but not proven causality with things like osteoporosis, renal failure and dementia.  I think the benefits of twice daily PPI and chronic PPI use far outweigh any potential risks.  She accepts this.  Return in approximately 6 months sooner as needed   Subjective:   Chief Complaint: Follow-up hiatal hernia and dysphagia  HPI 88 year old woman with a history of a large hiatal hernia that includes small bowel and pancreatic tail, declined surgery, GERD and last seen 10/12/2023 with worsening dysphagia.  EGD was performed 11/27/2023.  Findings as follows:  Benign-appearing esophageal stenosis. Dilated to 16.5 mm - Tortuous esophagus. - Large hiatal hernia. At least 8 cm. This distorts stomach anatomy somewhat as described above. - Gastroesophageal flap valve classified as Hill Grade IV (no fold, wide open lumen, hiatal hernia present). - The examination was otherwise normal. - No specimens collected.  A few days after the procedure she noted improved dysphagia but had called in noting increased reflux symptoms so I changed her pantoprazole  to 40 mg twice daily.  She reports significant improvement in her dysphagia where she is not having any impact dysphagia.  If she eats too much food it once it will transiently sit in the esophagus she thinks but as long as she chews very well and does not eat too quickly she is without dysphagia.  Again feels much better and has gained 4 pounds.   Wt Readings from Last 3  Encounters:  01/15/24 98 lb (44.5 kg)  11/27/23 94 lb (42.6 kg)  10/12/23 101 lb (45.8 kg)     Allergies  Allergen Reactions   Codeine Nausea And Vomiting   Penicillins Rash    Has patient had a PCN reaction causing immediate rash, facial/tongue/throat swelling, SOB or lightheadedness with hypotension: Yes Has patient had a PCN reaction causing severe rash involving mucus membranes or skin necrosis: No Has patient had a PCN reaction that required hospitalization: No Has patient had a PCN reaction occurring within the last 10 years: No If all of the above answers are NO, then may proceed with Cephalosporin use.    Mobic [Meloxicam] Other (See Comments)    Loss of blood per patient   Tramadol Nausea Only    Intolerance   Current Meds  Medication Sig   acetaminophen  (TYLENOL  8 HOUR) 650 MG CR tablet Take 1-2 tablets by mouth as needed.   ALPRAZolam  (XANAX ) 0.5 MG tablet Take 0.5 mg by mouth at bedtime.   denosumab  (PROLIA ) 60 MG/ML SOSY injection Inject 60 mg into the skin every 6 (six) months.   diclofenac Sodium (VOLTAREN) 1 % GEL Apply 1 application topically 3 (three) times daily as needed (pain).   Ferrous Sulfate  27 MG TABS Take 1 tablet by mouth daily. [employer], Blood Builder   hydrocortisone cream 1 % Apply 1 application topically daily as needed for itching (wound care).   hyoscyamine  (LEVSIN  SL) 0.125 MG SL tablet Take 1 tablet (0.125 mg total) by mouth every 4 (four) hours as needed for cramping.   ondansetron  (  ZOFRAN ) 4 MG tablet Take 1 tablet (4 mg total) by mouth every 4 (four) hours as needed for nausea or vomiting.   pantoprazole  (PROTONIX ) 40 MG tablet Take 1 tablet (40 mg total) by mouth 2 (two) times daily before a meal. Before breakfast and supper   Peppermint Oil (IBGARD) 90 MG CPCR Take 2 Capfuls by mouth 3 (three) times daily.   Polyethyl Glycol-Propyl Glycol (SYSTANE OP) Place 2 drops into both eyes 2 (two) times daily as needed (dry eyes).    polyethylene  glycol (MIRALAX  / GLYCOLAX ) packet Take 17 g by mouth daily. (Patient taking differently: Take 8.5 g by mouth daily. Sometimes more)   Past Medical History:  Diagnosis Date   Anxiety    Degenerative disc disease    GERD (gastroesophageal reflux disease)    Hiatal hernia 2010   GERD,esophageal dysmotility and paraesophageal hernia on esophogram   Osteopetrosis    degenerative spine disease, lumbar   Scoliosis    Past Surgical History:  Procedure Laterality Date   BALLOON DILATION N/A 11/27/2023   Procedure: BALLOON DILATION;  Surgeon: Avram Lupita BRAVO, MD;  Location: [facility];  Service: Gastroenterology;  Laterality: N/A;   BIOPSY  02/22/2018   Procedure: BIOPSY;  Surgeon: Eda Iha, MD;  Location: [facility];  Service: Gastroenterology;;   BREAST EXCISIONAL BIOPSY Bilateral 08/2002   path: fibrocystic changes.  sclerosing adenosis with microcalcification.   BREAST LUMPECTOMY     COLONOSCOPY  10/2007   screening study.  Dr Toribio Cedar.  small internal hemorrhoids, no polyps, no diverticulosis.     COLONOSCOPY WITH PROPOFOL  N/A 02/23/2018   Procedure: COLONOSCOPY WITH PROPOFOL ;  Surgeon: Avram Lupita BRAVO, MD;  Location: [facility] ENDOSCOPY;  Service: Endoscopy;  Laterality: N/A;   ESOPHAGOGASTRODUODENOSCOPY N/A 02/22/2018   Procedure: ESOPHAGOGASTRODUODENOSCOPY (EGD);  Surgeon: Eda Iha, MD;  Location: 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   Review of Systems As per HPI  Objective:   Physical Exam BP 116/62   Pulse 88   Ht 4' 10 (1.473 m)   Wt 98 lb (44.5 kg)   BMI 20.48 kg/m  General:         NAD petite elderly woman Eyes:               anicteric Lungs:            Clear severe kyphoscoliosis Heart::             S1S2 no rubs, murmurs or gallops Abdomen:      soft and nontender, BS+  19 minutes total time spent on this visit.

## 2024-01-16 DIAGNOSIS — H7292 Unspecified perforation of tympanic membrane, left ear: Secondary | ICD-10-CM | POA: Diagnosis not present

## 2024-01-16 DIAGNOSIS — H9212 Otorrhea, left ear: Secondary | ICD-10-CM | POA: Diagnosis not present

## 2024-01-22 DIAGNOSIS — M4004 Postural kyphosis, thoracic region: Secondary | ICD-10-CM | POA: Diagnosis not present

## 2024-01-22 DIAGNOSIS — M4124 Other idiopathic scoliosis, thoracic region: Secondary | ICD-10-CM | POA: Diagnosis not present

## 2024-01-25 DIAGNOSIS — K625 Hemorrhage of anus and rectum: Secondary | ICD-10-CM | POA: Diagnosis not present

## 2024-02-08 DIAGNOSIS — M4124 Other idiopathic scoliosis, thoracic region: Secondary | ICD-10-CM | POA: Diagnosis not present

## 2024-02-08 DIAGNOSIS — M4004 Postural kyphosis, thoracic region: Secondary | ICD-10-CM | POA: Diagnosis not present

## 2024-02-12 DIAGNOSIS — Z961 Presence of intraocular lens: Secondary | ICD-10-CM | POA: Diagnosis not present

## 2024-02-12 DIAGNOSIS — H52203 Unspecified astigmatism, bilateral: Secondary | ICD-10-CM | POA: Diagnosis not present

## 2024-02-12 DIAGNOSIS — H1789 Other corneal scars and opacities: Secondary | ICD-10-CM | POA: Diagnosis not present

## 2024-02-13 ENCOUNTER — Other Ambulatory Visit: Payer: Self-pay | Admitting: Internal Medicine

## 2024-02-19 DIAGNOSIS — H903 Sensorineural hearing loss, bilateral: Secondary | ICD-10-CM | POA: Diagnosis not present

## 2024-02-27 DIAGNOSIS — M4004 Postural kyphosis, thoracic region: Secondary | ICD-10-CM | POA: Diagnosis not present

## 2024-02-27 DIAGNOSIS — M4124 Other idiopathic scoliosis, thoracic region: Secondary | ICD-10-CM | POA: Diagnosis not present

## 2024-03-14 DIAGNOSIS — Z79899 Other long term (current) drug therapy: Secondary | ICD-10-CM | POA: Diagnosis not present

## 2024-03-14 DIAGNOSIS — E7800 Pure hypercholesterolemia, unspecified: Secondary | ICD-10-CM | POA: Diagnosis not present

## 2024-03-14 DIAGNOSIS — K909 Intestinal malabsorption, unspecified: Secondary | ICD-10-CM | POA: Diagnosis not present

## 2024-03-14 DIAGNOSIS — M810 Age-related osteoporosis without current pathological fracture: Secondary | ICD-10-CM | POA: Diagnosis not present

## 2024-03-17 DIAGNOSIS — M4004 Postural kyphosis, thoracic region: Secondary | ICD-10-CM | POA: Diagnosis not present

## 2024-03-17 DIAGNOSIS — M4124 Other idiopathic scoliosis, thoracic region: Secondary | ICD-10-CM | POA: Diagnosis not present

## 2024-04-02 DIAGNOSIS — M4004 Postural kyphosis, thoracic region: Secondary | ICD-10-CM | POA: Diagnosis not present

## 2024-04-02 DIAGNOSIS — M4124 Other idiopathic scoliosis, thoracic region: Secondary | ICD-10-CM | POA: Diagnosis not present

## 2024-04-18 DIAGNOSIS — M4004 Postural kyphosis, thoracic region: Secondary | ICD-10-CM | POA: Diagnosis not present

## 2024-04-18 DIAGNOSIS — M4124 Other idiopathic scoliosis, thoracic region: Secondary | ICD-10-CM | POA: Diagnosis not present

## 2024-05-14 DIAGNOSIS — M4004 Postural kyphosis, thoracic region: Secondary | ICD-10-CM | POA: Diagnosis not present

## 2024-05-14 DIAGNOSIS — M4124 Other idiopathic scoliosis, thoracic region: Secondary | ICD-10-CM | POA: Diagnosis not present

## 2024-05-14 DIAGNOSIS — Z1331 Encounter for screening for depression: Secondary | ICD-10-CM | POA: Diagnosis not present

## 2024-05-14 DIAGNOSIS — Z0000 Encounter for general adult medical examination without abnormal findings: Secondary | ICD-10-CM | POA: Diagnosis not present

## 2024-06-16 ENCOUNTER — Other Ambulatory Visit: Payer: Self-pay | Admitting: Internal Medicine

## 2024-07-30 ENCOUNTER — Ambulatory Visit: Admitting: Internal Medicine
